# Patient Record
Sex: Male | Born: 1940 | Race: White | Hispanic: No | Marital: Married | Smoking: Former smoker
Health system: Southern US, Community
[De-identification: ages and names within clinical notes are randomized; demographics above are authoritative.]

## PROBLEM LIST (undated history)

## (undated) DIAGNOSIS — E119 Type 2 diabetes mellitus without complications: Secondary | ICD-10-CM

## (undated) DIAGNOSIS — I10 Essential (primary) hypertension: Secondary | ICD-10-CM

## (undated) DIAGNOSIS — K7460 Unspecified cirrhosis of liver: Secondary | ICD-10-CM

## (undated) DIAGNOSIS — Z515 Encounter for palliative care: Secondary | ICD-10-CM

## (undated) HISTORY — PX: BACK SURGERY: SHX140

## (undated) HISTORY — PX: HERNIA REPAIR: SHX51

---

## 1998-09-20 ENCOUNTER — Ambulatory Visit (HOSPITAL_COMMUNITY): Admission: RE | Admit: 1998-09-20 | Discharge: 1998-09-20 | Payer: Self-pay | Admitting: Gastroenterology

## 2000-02-29 ENCOUNTER — Encounter (INDEPENDENT_AMBULATORY_CARE_PROVIDER_SITE_OTHER): Payer: Self-pay | Admitting: Specialist

## 2000-02-29 ENCOUNTER — Other Ambulatory Visit: Admission: RE | Admit: 2000-02-29 | Discharge: 2000-02-29 | Payer: Self-pay | Admitting: Urology

## 2001-05-20 ENCOUNTER — Encounter: Admission: RE | Admit: 2001-05-20 | Discharge: 2001-08-18 | Payer: Self-pay | Admitting: Family Medicine

## 2001-07-20 ENCOUNTER — Emergency Department (HOSPITAL_COMMUNITY): Admission: EM | Admit: 2001-07-20 | Discharge: 2001-07-20 | Payer: Self-pay

## 2001-08-19 ENCOUNTER — Ambulatory Visit (HOSPITAL_COMMUNITY): Admission: RE | Admit: 2001-08-19 | Discharge: 2001-08-19 | Payer: Self-pay | Admitting: Gastroenterology

## 2001-08-19 ENCOUNTER — Encounter (INDEPENDENT_AMBULATORY_CARE_PROVIDER_SITE_OTHER): Payer: Self-pay | Admitting: Specialist

## 2001-09-30 ENCOUNTER — Encounter: Admission: RE | Admit: 2001-09-30 | Discharge: 2001-09-30 | Payer: Self-pay | Admitting: General Surgery

## 2001-09-30 ENCOUNTER — Encounter: Payer: Self-pay | Admitting: General Surgery

## 2001-10-02 ENCOUNTER — Ambulatory Visit (HOSPITAL_BASED_OUTPATIENT_CLINIC_OR_DEPARTMENT_OTHER): Admission: RE | Admit: 2001-10-02 | Discharge: 2001-10-02 | Payer: Self-pay | Admitting: General Surgery

## 2001-10-02 ENCOUNTER — Encounter (INDEPENDENT_AMBULATORY_CARE_PROVIDER_SITE_OTHER): Payer: Self-pay | Admitting: Specialist

## 2002-04-29 ENCOUNTER — Encounter: Admission: RE | Admit: 2002-04-29 | Discharge: 2002-04-29 | Payer: Self-pay | Admitting: Family Medicine

## 2002-04-29 ENCOUNTER — Encounter: Payer: Self-pay | Admitting: Family Medicine

## 2005-03-10 ENCOUNTER — Encounter: Admission: RE | Admit: 2005-03-10 | Discharge: 2005-03-10 | Payer: Self-pay | Admitting: Allergy and Immunology

## 2005-03-20 ENCOUNTER — Encounter: Admission: RE | Admit: 2005-03-20 | Discharge: 2005-03-20 | Payer: Self-pay | Admitting: Family Medicine

## 2007-05-07 ENCOUNTER — Ambulatory Visit (HOSPITAL_COMMUNITY): Admission: RE | Admit: 2007-05-07 | Discharge: 2007-05-07 | Payer: Self-pay | Admitting: Gastroenterology

## 2007-05-07 ENCOUNTER — Encounter (INDEPENDENT_AMBULATORY_CARE_PROVIDER_SITE_OTHER): Payer: Self-pay | Admitting: Gastroenterology

## 2007-10-22 ENCOUNTER — Encounter (INDEPENDENT_AMBULATORY_CARE_PROVIDER_SITE_OTHER): Payer: Self-pay | Admitting: Gastroenterology

## 2007-10-22 ENCOUNTER — Ambulatory Visit (HOSPITAL_COMMUNITY): Admission: RE | Admit: 2007-10-22 | Discharge: 2007-10-22 | Payer: Self-pay | Admitting: Gastroenterology

## 2010-01-30 ENCOUNTER — Emergency Department (HOSPITAL_COMMUNITY)
Admission: EM | Admit: 2010-01-30 | Discharge: 2010-01-30 | Payer: Self-pay | Source: Home / Self Care | Admitting: Emergency Medicine

## 2010-09-02 LAB — CBC
HCT: 40.7 % (ref 39.0–52.0)
RDW: 13.4 % (ref 11.5–15.5)
WBC: 7 10*3/uL (ref 4.0–10.5)

## 2010-09-02 LAB — URINALYSIS, ROUTINE W REFLEX MICROSCOPIC
Glucose, UA: NEGATIVE mg/dL
Protein, ur: NEGATIVE mg/dL
pH: 7 (ref 5.0–8.0)

## 2010-09-02 LAB — POCT I-STAT, CHEM 8
BUN: 14 mg/dL (ref 6–23)
Chloride: 103 mEq/L (ref 96–112)
Potassium: 3.6 mEq/L (ref 3.5–5.1)
Sodium: 138 mEq/L (ref 135–145)

## 2010-09-02 LAB — DIFFERENTIAL
Basophils Absolute: 0 10*3/uL (ref 0.0–0.1)
Lymphocytes Relative: 17 % (ref 12–46)
Monocytes Absolute: 0.8 10*3/uL (ref 0.1–1.0)
Neutro Abs: 4.8 10*3/uL (ref 1.7–7.7)

## 2010-09-02 LAB — ROCKY MTN SPOTTED FVR AB, IGG-BLOOD: RMSF IgG: 0.64 IV

## 2010-09-02 LAB — ROCKY MTN SPOTTED FVR AB, IGM-BLOOD: RMSF IgM: 0.09 IV (ref 0.00–0.89)

## 2010-09-02 LAB — B. BURGDORFI ANTIBODIES: B burgdorferi Ab IgG+IgM: 0.3 {ISR}

## 2010-10-02 ENCOUNTER — Emergency Department (HOSPITAL_COMMUNITY)
Admission: EM | Admit: 2010-10-02 | Discharge: 2010-10-02 | Disposition: A | Discharge status: Home / Self Care | Payer: Medicare Other | Attending: Emergency Medicine | Admitting: Emergency Medicine

## 2010-10-02 ENCOUNTER — Emergency Department (HOSPITAL_COMMUNITY): Payer: Medicare Other

## 2010-10-02 DIAGNOSIS — Y92009 Unspecified place in unspecified non-institutional (private) residence as the place of occurrence of the external cause: Secondary | ICD-10-CM | POA: Insufficient documentation

## 2010-10-02 DIAGNOSIS — I10 Essential (primary) hypertension: Secondary | ICD-10-CM | POA: Insufficient documentation

## 2010-10-02 DIAGNOSIS — E7800 Pure hypercholesterolemia, unspecified: Secondary | ICD-10-CM | POA: Insufficient documentation

## 2010-10-02 DIAGNOSIS — Z79899 Other long term (current) drug therapy: Secondary | ICD-10-CM | POA: Insufficient documentation

## 2010-10-02 DIAGNOSIS — S0990XA Unspecified injury of head, initial encounter: Secondary | ICD-10-CM | POA: Insufficient documentation

## 2010-10-02 DIAGNOSIS — K219 Gastro-esophageal reflux disease without esophagitis: Secondary | ICD-10-CM | POA: Insufficient documentation

## 2010-10-02 DIAGNOSIS — S0100XA Unspecified open wound of scalp, initial encounter: Secondary | ICD-10-CM | POA: Insufficient documentation

## 2010-10-02 DIAGNOSIS — W010XXA Fall on same level from slipping, tripping and stumbling without subsequent striking against object, initial encounter: Secondary | ICD-10-CM | POA: Insufficient documentation

## 2010-10-02 DIAGNOSIS — E119 Type 2 diabetes mellitus without complications: Secondary | ICD-10-CM | POA: Insufficient documentation

## 2010-11-01 NOTE — Op Note (Signed)
NAME:  Jerry [name], DUPUY NO.:  [id number]   MEDICAL RECORD NO.:  [id number]          PATIENT TYPE:  AMB   LOCATION:  ENDO                         FACILITY:  [facility]   PHYSICIAN:  Bernette Redbird, M.D.   DATE OF BIRTH:  29-Aug-1940   DATE OF PROCEDURE:  10/22/2007  DATE OF DISCHARGE:                               OPERATIVE REPORT   PROCEDURE:  Colonoscopy with biopsy.   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  This is being done to confirm  the presence or absence of complete ablation of that lesion.   FINDINGS:  Successful ablation of the large flat polyp noted previously.  Several diminutive polyps removed.  Sigmoid diverticulosis.   PROCEDURE:  The nature, purpose, and risks of the procedure were  familiar to the patient, who provided written consent.  Sedation was  fentanyl 100 mcg and Versed 10 mg IV without clinical instability.  Digital exam of the prostate showed it to be enlarged and somewhat firm  but without discrete nodules.   The Pentax adult video colonoscope was advanced to the cecum as  identified by visualization of the appendiceal orifice and ileocecal  valve.  There was mild to moderate looping but overall advancement was  not that difficult.   I encountered four diminutive sessile polyps on the way in.  One was in  the mid transverse colon, two were near the splenic flexure and one was  in the ascending colon near the previous tattoo.  Each of these was  removed by several cold biopsies.  It appeared excision was probably  complete in all cases.   The tattoo corresponding to the previous polypectomy site was identified  in the mid to proximal ascending colon area and nearby was a white  eschar or scar almost certainly corresponding to the mid previous polyp  ablation site.  I could not see any evidence  of residual polyp tissue in  this area despite careful inspection.  Nearby, but not really part of  the scar area, was a diminutive sessile polyp biopsied as described  above.   There was moderately severe sigmoid diverticulosis.   No large polyps, cancer or colitis were observed.  There was a roughly 1-  cm erythematous blotchy area in the ascending colon but it did not look  like a classic AVM.   Retroflexion in the rectum was unremarkable.  A small bump that  essentially flattened out on antegrade viewing was not biopsied.   The patient tolerated the procedure well and there no apparent  complications.   IMPRESSION:  1. Several small polyps removed by cold biopsy as described above.  2. No evidence of recurrent or residual polyp in the ascending colon      at the site of the previous argon plasma coagulation therapy of the      flat adenoma from 6 months ago.  3. Severe sigmoid diverticulosis.   PLAN:  Await pathology results.  Probable colonoscopic  follow-up in 2  years to carefully monitor the site of the previous flat adenoma.           ______________________________  Bernette Redbird, M.D.     RB/MEDQ  D:  10/22/2007  T:  10/22/2007  Job:  [id number]   cc:   Bryan Lemma. Manus Gunning, M.D.  Fax: [phone]

## 2010-11-01 NOTE — Op Note (Signed)
NAME:  Jerry [name], Jerry [name] NO.:  [id number]   MEDICAL RECORD NO.:  [id number]          PATIENT TYPE:  AMB   LOCATION:  ENDO                         FACILITY:  [facility]   PHYSICIAN:  Bernette Redbird, M.D.   DATE OF BIRTH:  15-Nov-1940   DATE OF PROCEDURE:  05/07/2007  DATE OF DISCHARGE:                               OPERATIVE REPORT   PROCEDURE:  Colonoscopy with ablation of mucosal lesion and biopsy.   INDICATIONS:  70 year old gentleman six months status post a colonoscopy  which showed a flat adenoma in the region of the proximal colon, unable  to be resected due to its flat character, with biopsies showing  adenomatous change without high grade dysplasia.   FINDINGS:  3 cm x 1 cm flat adenoma in the proximal ascending colon  treated with Argon plasma coagulation therapy.  Two diminutive polyps  cold biopsied.  Severe sigmoid diverticulosis.   DESCRIPTION OF PROCEDURE:  The nature, purpose, and risks of the  procedure were familiar to the patient from prior examination in the  provided written consent.  Sedation was Phenergan 12.5 mg, fentanyl 87.5  mcg, and Versed 7.5 mg IV, without arrhythmias or desaturation.  Digital  exam of the prostate was unremarkable.  The Pentax adult video  colonoscope was advanced quite easily around the colon to the cecum as  identified by visualization of the appendiceal orifice and pullback was  then performed.  The quality of the prep was fair, adequate to exclude  significant or large polyps, but possibly obscuring smaller polyps.  Two  diminutive sessile polyps were identified and removed by cold biopsy  technique, one from the transverse colon, the other from the proximal  ascending colon.  The area of the previously noted high grade that flat  adenoma was identified by the adjacent tattoo.  The Argon plasma  coagulator was used to cauterize virtually the entire surface of that  polyp, without evidence of bleeding or excessive tissue  penetration,  evident to endoscopic exam.  Pullback was then performed the remainder  of the way around the colon.  There was severe sigmoid diverticular  disease with both large and small mouth diverticula.  No large polyps,  cancer, colitis or evidence of infection was noted.  There were a few  spots where there appeared to be telangiectatic changes in the colon,  almost like spider veins about 1 cm in diameter.  The patient tolerated  the procedure well and there were no apparent complications.   IMPRESSION:  1. Severe sigmoid diverticulosis.  2. Two diminutive sessile polyps removed by cold biopsy technique.  3. Flat adenoma as previously identified, treated with Argon plasma      coagulator as described above.   PLAN:  1. Await pathology results on the small polyps.  2. Follow up colonoscopy in about six months to recheck the area of      the flat adenoma and to render further Argon plasma coagulation      therapy, if needed.          ______________________________  Bernette Redbird, M.D.    RB/MEDQ  D:  05/07/2007  T:  05/07/2007  Job:  [id number]

## 2010-11-04 NOTE — Op Note (Signed)
[place]  Patient:    Jerry [name], Jerry [name] Visit Number: [id number] MRN: [id number]          Service Type: DSU Location: [facility] Attending Physician:  Caleen Essex Dictated by:   Ollen Gross. Vernell Morgans, M.D. Proc. Date: 10/02/01 Admit Date:  10/02/2001 Discharge Date: 10/02/2001                             Operative Report  PREOPERATIVE DIAGNOSIS:  Left inguinal hernia.  POSTOPERATIVE DIAGNOSIS:  Left direct and indirect inguinal hernia with large lipoma of the cord.  PROCEDURE:  Repair of left inguinal hernia with mesh and excision of the lipoma of the cord.  SURGEON:  Ollen Gross. Vernell Morgans, M.D.  ANESTHESIA:  General via LMA.  DESCRIPTION OF PROCEDURE:  After informed consent was obtained, the patient was brought to the operating room and placed in the supine position on the operating room table.  After adequate induction of general anesthesia, the patients left groin was prepped with Betadine and draped in the usual sterile manner.  An incision was made from near the edge of the pubic tubercle toward the anterior superior iliac spine with a 15 blade knife.  This incision was carried down through the skin and subcutaneous tissues using the Bovie electrocautery until the external oblique fascia was encountered.  The fascia was opened along its fibers with a 15 blade knife and Metzenbaum scissors were placed underneath the fascial layer to separate any underlying adhesions.  The fascial layer was then opened toward the apex of the external ring.  Blunt dissection was then carried out around the cord and at the edge of the pubic tubercle.  The cord was able to be surrounded between two fingers with blunt dissection.  A quarter-inch Penrose drain was then placed around these structures for retraction purposes.  There was an obvious large lipoma of the cord, which was dissected sharply with the Bovie electrocautery and bluntly, and the base of it was  clamped with a hemostat and tied with a 3-0 silk tie after being divided.  The lipoma was sent to pathology for evaluation. Attention was then turned to the cord structures themselves.  Blunt dissection was carried out to separate the cord structures, and a hernia sac was identified.  This was separated from the rest of the cord structures carefully so as to not damage the vas deferens, and the hernia sac was then opened. There were no viscera within the sac and the sac was ligated near its base with a 2-0 silk suture ligature, and the distal sac was sent to pathology for evaluation.  There was also an obvious weakness of the floor of the inguinal canal with significant bulging, and this was temporarily repaired with a running 3-0 Vicryl stitch.  At this point a piece of Atrium mesh was fashioned to fit the area and was sewn in a running manner with a 2-0 Prolene stitch to the shelving edge of the inguinal ligament inferiorly.  The mesh was then tacked to the aponeurotic muscular layer, strength layer of the transversalis superiorly in multiple spots with interrupted 2-0 Prolene vertical mattress stitches.  The tails of the mesh were wrapped around the cord and placed out laterally toward the anterior superior iliac spine.  The tails of the mesh were anchored laterally to the shelving edge of the inguinal ligament with an interrupted Prolene stitch.  The external oblique fascia  was then closed with a running 2-0 Prolene stitch.  The ilioinguinal nerve was left intact with the cord.  The subcutaneous fascial layer was closed with interrupted 3-0 Vicryl stitches.  The wound was infiltrated with 0.25% Marcaine and the skin was closed with a running 4-0 Monocryl subcuticular stitch.  Benzoin and Steri-Strips with sterile dressings were applied.  The patient tolerated the procedure well.  At the end of the case, all needle, sponge, and instrument counts were correct.  The patient was awakened and  taken to the recovery room in stable condition. Dictated by:   Ollen Gross. Vernell Morgans, M.D. Attending Physician:  Caleen Essex DD:  10/03/01 TD:  10/04/01 Job: [id number]

## 2010-11-04 NOTE — Procedures (Signed)
[place]  Patient:    Jerry [name], Jerry [name] Visit Number: [id number] MRN: [id number]          Service Type: END Location: ENDO Attending Physician:  Rich Brave Dictated by:   Florencia Reasons, M.D. Proc. Date: 08/19/01 Admit Date:  08/19/2001   CC:         Dyanne Carrel, M.D.   Procedure Report  PROCEDURE PERFORMED:  Colonoscopy with polypectomy.  ENDOSCOPIST:  Florencia Reasons, M.D.  INDICATIONS FOR PROCEDURE:  The patient is a 70 year old gentleman with recent rectal bleeding.  He has prior history of colonic adenomas having been removed, most recently two small adenomas removed about three years ago.  He also is known to have significant diverticulosis.  FINDINGS:  A 4 mm polyp removed from the proximal rectum.  Extensive diverticulosis.  DESCRIPTION OF PROCEDURE:  The nature, purpose and risks of the procedure were familiar to the patient from prior examination and he provided written consent.  The Olympus adult video colonoscope was advanced quite easily to the cecum as identified by the visualization of the ileocecal valve and the absence of further lumen.  Pull-back was then performed.  The appendiceal orifice was also identified.  The quality of the prep was very good and it is felt that all areas were adequately seen.  The patient had a 3 to 4 mm semipedunculated polyp snared at 16 cm from the external anal opening in the proximal rectum.  The polyp was retrieved by suctioning through the scope.  No other polyps were seen and there was no evidence of cancer, colitis or vascular malformations.  There was extensive diverticulosis, at least on the left side.  The patient tolerated the procedure well and there were no apparent complications.  IMPRESSION:  Small rectal polyp removed.  Extensive diverticulosis.  PLAN:  Await pathology on the polyp.  Probable  can wait five years for follow-up colonoscopy,  keeping in mind the patients prior history of colonic adenomas. Dictated by:   Florencia Reasons, M.D. Attending Physician:  Rich Brave DD:  08/19/01 TD:  08/19/01 Job: [id number]

## 2013-03-31 ENCOUNTER — Ambulatory Visit
Admission: RE | Admit: 2013-03-31 | Discharge: 2013-03-31 | Disposition: A | Discharge status: Home / Self Care | Payer: Medicare Other | Source: Ambulatory Visit | Attending: Family Medicine | Admitting: Family Medicine

## 2013-03-31 ENCOUNTER — Other Ambulatory Visit: Payer: Self-pay | Admitting: Family Medicine

## 2013-03-31 DIAGNOSIS — R079 Chest pain, unspecified: Secondary | ICD-10-CM

## 2013-04-04 ENCOUNTER — Other Ambulatory Visit: Payer: Self-pay | Admitting: Physician Assistant

## 2013-04-04 DIAGNOSIS — R109 Unspecified abdominal pain: Secondary | ICD-10-CM

## 2013-04-06 ENCOUNTER — Emergency Department (HOSPITAL_COMMUNITY): Payer: Medicare Other

## 2013-04-06 ENCOUNTER — Encounter (HOSPITAL_COMMUNITY): Payer: Self-pay | Admitting: Emergency Medicine

## 2013-04-06 ENCOUNTER — Inpatient Hospital Stay (HOSPITAL_COMMUNITY)
Admission: EM | Admit: 2013-04-06 | Discharge: 2013-04-09 | DRG: 441 | Disposition: A | Discharge status: Home / Self Care | Payer: Medicare Other | Attending: Internal Medicine | Admitting: Internal Medicine

## 2013-04-06 DIAGNOSIS — Z87891 Personal history of nicotine dependence: Secondary | ICD-10-CM

## 2013-04-06 DIAGNOSIS — R197 Diarrhea, unspecified: Secondary | ICD-10-CM | POA: Diagnosis present

## 2013-04-06 DIAGNOSIS — E871 Hypo-osmolality and hyponatremia: Secondary | ICD-10-CM | POA: Diagnosis present

## 2013-04-06 DIAGNOSIS — D6959 Other secondary thrombocytopenia: Secondary | ICD-10-CM | POA: Diagnosis present

## 2013-04-06 DIAGNOSIS — M7989 Other specified soft tissue disorders: Secondary | ICD-10-CM | POA: Diagnosis present

## 2013-04-06 DIAGNOSIS — N189 Chronic kidney disease, unspecified: Secondary | ICD-10-CM | POA: Diagnosis present

## 2013-04-06 DIAGNOSIS — R7402 Elevation of levels of lactic acid dehydrogenase (LDH): Secondary | ICD-10-CM | POA: Diagnosis present

## 2013-04-06 DIAGNOSIS — I129 Hypertensive chronic kidney disease with stage 1 through stage 4 chronic kidney disease, or unspecified chronic kidney disease: Secondary | ICD-10-CM | POA: Diagnosis present

## 2013-04-06 DIAGNOSIS — R748 Abnormal levels of other serum enzymes: Secondary | ICD-10-CM | POA: Diagnosis present

## 2013-04-06 DIAGNOSIS — E119 Type 2 diabetes mellitus without complications: Secondary | ICD-10-CM | POA: Diagnosis present

## 2013-04-06 DIAGNOSIS — K7689 Other specified diseases of liver: Principal | ICD-10-CM | POA: Diagnosis present

## 2013-04-06 DIAGNOSIS — R7401 Elevation of levels of liver transaminase levels: Secondary | ICD-10-CM | POA: Diagnosis present

## 2013-04-06 DIAGNOSIS — J9600 Acute respiratory failure, unspecified whether with hypoxia or hypercapnia: Secondary | ICD-10-CM | POA: Diagnosis present

## 2013-04-06 DIAGNOSIS — R188 Other ascites: Secondary | ICD-10-CM | POA: Diagnosis present

## 2013-04-06 DIAGNOSIS — N179 Acute kidney failure, unspecified: Secondary | ICD-10-CM | POA: Diagnosis present

## 2013-04-06 HISTORY — DX: Type 2 diabetes mellitus without complications: E11.9

## 2013-04-06 HISTORY — DX: Essential (primary) hypertension: I10

## 2013-04-06 LAB — COMPREHENSIVE METABOLIC PANEL
ALT: 21 U/L (ref 0–53)
AST: 45 U/L — ABNORMAL HIGH (ref 0–37)
Alkaline Phosphatase: 50 U/L (ref 39–117)
CO2: 21 mEq/L (ref 19–32)
GFR calc Af Amer: 42 mL/min — ABNORMAL LOW (ref >90)
GFR calc non Af Amer: 37 mL/min — ABNORMAL LOW (ref >90)
Glucose, Bld: 71 mg/dL (ref 70–99)
Potassium: 3.9 mEq/L (ref 3.5–5.1)
Sodium: 127 mEq/L — ABNORMAL LOW (ref 135–145)
Total Protein: 7 g/dL (ref 6.0–8.3)

## 2013-04-06 LAB — CBC WITH DIFFERENTIAL/PLATELET
Basophils Absolute: 0 K/uL (ref 0.0–0.1)
Basophils Relative: 0 % (ref 0–1)
Eosinophils Absolute: 0.1 K/uL (ref 0.0–0.7)
Eosinophils Relative: 1 % (ref 0–5)
HCT: 36.4 % — ABNORMAL LOW (ref 39.0–52.0)
Hemoglobin: 12.6 g/dL — ABNORMAL LOW (ref 13.0–17.0)
Lymphocytes Relative: 18 % (ref 12–46)
Lymphs Abs: 1.9 K/uL (ref 0.7–4.0)
MCH: 31.1 pg (ref 26.0–34.0)
MCHC: 34.6 g/dL (ref 30.0–36.0)
MCV: 89.9 fL (ref 78.0–100.0)
Monocytes Absolute: 1.3 K/uL — ABNORMAL HIGH (ref 0.1–1.0)
Monocytes Relative: 13 % — ABNORMAL HIGH (ref 3–12)
Neutro Abs: 7.1 K/uL (ref 1.7–7.7)
Neutrophils Relative %: 68 % (ref 43–77)
Platelets: 158 K/uL (ref 150–400)
RBC: 4.05 MIL/uL — ABNORMAL LOW (ref 4.22–5.81)
RDW: 15.3 % (ref 11.5–15.5)
WBC: 10.4 K/uL (ref 4.0–10.5)

## 2013-04-06 LAB — URINALYSIS, ROUTINE W REFLEX MICROSCOPIC
Bilirubin Urine: NEGATIVE
Glucose, UA: NEGATIVE mg/dL
Hgb urine dipstick: NEGATIVE
Ketones, ur: NEGATIVE mg/dL
Nitrite: NEGATIVE
Protein, ur: NEGATIVE mg/dL
Specific Gravity, Urine: 1.022 (ref 1.005–1.030)
Urobilinogen, UA: 0.2 mg/dL (ref 0.0–1.0)
pH: 5.5 (ref 5.0–8.0)

## 2013-04-06 LAB — PROTIME-INR
INR: 1.36 (ref 0.00–1.49)
Prothrombin Time: 16.4 seconds — ABNORMAL HIGH (ref 11.6–15.2)

## 2013-04-06 LAB — PHOSPHORUS: Phosphorus: 3.4 mg/dL (ref 2.3–4.6)

## 2013-04-06 LAB — MAGNESIUM: Magnesium: 2.3 mg/dL (ref 1.5–2.5)

## 2013-04-06 LAB — GLUCOSE, CAPILLARY
Glucose-Capillary: 72 mg/dL (ref 70–99)
Glucose-Capillary: 150 mg/dL — ABNORMAL HIGH (ref 70–99)

## 2013-04-06 LAB — TSH: TSH: 2.001 u[IU]/mL (ref 0.350–4.500)

## 2013-04-06 LAB — URINE MICROSCOPIC-ADD ON

## 2013-04-06 LAB — LIPASE, BLOOD: Lipase: 70 U/L — ABNORMAL HIGH (ref 11–59)

## 2013-04-06 LAB — TROPONIN I: Troponin I: <0.3 ng/mL

## 2013-04-06 LAB — PRO B NATRIURETIC PEPTIDE: Pro B Natriuretic peptide (BNP): 73.5 pg/mL (ref 0–125)

## 2013-04-06 MED ORDER — ENOXAPARIN SODIUM 30 MG/0.3ML ~~LOC~~ SOLN: 30.0000 mg | SUBCUTANEOUS | Status: DC

## 2013-04-06 MED ORDER — HYDROCODONE-ACETAMINOPHEN 5-325 MG PO TABS: 1.0000 | ORAL_TABLET | ORAL | Status: DC | PRN

## 2013-04-06 MED ORDER — IOHEXOL 300 MG/ML  SOLN
50.0000 mL | Freq: Once | INTRAMUSCULAR | Status: AC | PRN
  Administered 2013-04-06: 50 mL via ORAL

## 2013-04-06 MED ORDER — SODIUM CHLORIDE 0.9 % IJ SOLN: 3.0000 mL | INTRAMUSCULAR | Status: DC | PRN

## 2013-04-06 MED ORDER — ENOXAPARIN SODIUM 30 MG/0.3ML ~~LOC~~ SOLN
30.0000 mg | SUBCUTANEOUS | Status: DC
  Administered 2013-04-07 – 2013-04-08 (×2): 30 mg via SUBCUTANEOUS
  Filled 2013-04-06 (×3): qty 0.3

## 2013-04-06 MED ORDER — FUROSEMIDE 10 MG/ML IJ SOLN
40.0000 mg | Freq: Once | INTRAMUSCULAR | Status: AC
  Administered 2013-04-06: 40 mg via INTRAVENOUS
  Filled 2013-04-06: qty 4

## 2013-04-06 MED ORDER — PANTOPRAZOLE SODIUM 40 MG PO TBEC
40.0000 mg | DELAYED_RELEASE_TABLET | Freq: Every day | ORAL | Status: DC
  Administered 2013-04-06 – 2013-04-09 (×3): 40 mg via ORAL
  Filled 2013-04-06 (×4): qty 1

## 2013-04-06 MED ORDER — ENOXAPARIN SODIUM 40 MG/0.4ML ~~LOC~~ SOLN
40.0000 mg | SUBCUTANEOUS | Status: DC
  Administered 2013-04-06: 40 mg via SUBCUTANEOUS
  Filled 2013-04-06: qty 0.4

## 2013-04-06 MED ORDER — ONDANSETRON HCL 4 MG PO TABS: 4.0000 mg | ORAL_TABLET | Freq: Four times a day (QID) | ORAL | Status: DC | PRN

## 2013-04-06 MED ORDER — HYDROMORPHONE HCL PF 1 MG/ML IJ SOLN: 1.0000 mg | INTRAMUSCULAR | Status: DC | PRN

## 2013-04-06 MED ORDER — SODIUM CHLORIDE 0.9 % IV SOLN: 250.0000 mL | INTRAVENOUS | Status: DC | PRN

## 2013-04-06 MED ORDER — SODIUM CHLORIDE 0.9 % IJ SOLN
3.0000 mL | Freq: Two times a day (BID) | INTRAMUSCULAR | Status: DC
  Administered 2013-04-08 – 2013-04-09 (×3): 3 mL via INTRAVENOUS

## 2013-04-06 MED ORDER — ONDANSETRON HCL 4 MG/2ML IJ SOLN: 4.0000 mg | Freq: Four times a day (QID) | INTRAMUSCULAR | Status: DC | PRN

## 2013-04-06 MED ORDER — SODIUM CHLORIDE 0.9 % IV BOLUS (SEPSIS)
500.0000 mL | Freq: Once | INTRAVENOUS | Status: AC
  Administered 2013-04-06: 500 mL via INTRAVENOUS

## 2013-04-06 NOTE — H&P (Signed)
[facility] History and Physical  Jerry [name] ZOX:[id number] DOB: 10/16/1940 DOA: 04/06/2013  Referring physician: ED physician PCP: Provider Not In System   Chief Complaint: shortness of breath   HPI:  Pt is 72 yo male who presented to [facility] ED with main concern of progressively worsening abdominal distension that he initially noted several weeks prior to this admission, now much worse and making it difficult to take deep breath. Pt is unsure of the weight gain but feels like he did gain several pounds. He denies similar events in the past, denies fevers, chills, chest pain, no specific abdominal concerns except distension and non bloody diarrhea that also started several days prior to this admission. Pt explains he has recently been treated with ABX but he can not recall the name of the ABX. He denies recent sicknesses or hospitalizations, no sick contacts or exposures, no alcohol use.   In ED, pt noted to have significant ascites on exam, confirmed with CT abd. [facility] asked to admit for further evaluation and management.   Assessment and Plan:  Principal Problem:   Acute respiratory failure - most likely secondary to abdominal ascites - once paracentesis done this will likely improve - monitor closely vitals per floor protocol  Active Problems:   Ascites - unclear etiology, cirrhosis evident on CT abd and pelvis and ? Alcohol induced liver damage - will ask IR for assistance with therapeutic and diagnostic paracentesis   Diarrhea - unclear etiology and given recent use of ABX will check C. Diff PCR - monitor electrolytes and supplement as indicated   Acute on chronic renal failure - likely pre renal component imposed on chronic renal failure - will hold ARB, Metformin, Dyazide  - repeat BMP in AM   Transaminitis - unclear etiology and appears to be in the pattern of alcohol use - will trend while inpatient    Elevated lipase - given transaminitis there is certainly  suspicion of alcohol use but pt denies - will repeat in AM - treat with analgesia and antiemetics as needed   Hyponatremia - most likely secondary to volume status   Diabetes mellitus - will check A1C - hold Metformin due to acute renal failure - place on SSI for now  - monitor closely, BMP in AM  Code Status: Full Family Communication: Pt at bedside Disposition Plan: Admit to medical floor   Review of Systems:  Constitutional: Negative for fever, chills and malaise/fatigue. Negative for diaphoresis.  HENT: Negative for hearing loss, ear pain, nosebleeds, congestion, sore throat, neck pain, tinnitus and ear discharge.   Eyes: Negative for blurred vision, double vision, photophobia, pain, discharge and redness.  Respiratory: Negative for cough, hemoptysis, heezing and stridor.   Cardiovascular: Negative for chest pain, palpitations, orthopnea, claudication.  Gastrointestinal: Negative for heartburn, constipation, blood in stool and melena.  Genitourinary: Negative for dysuria, urgency, frequency, hematuria and flank pain.  Musculoskeletal: Negative for myalgias, back pain, joint pain and falls.  Skin: Negative for itching and rash.  Neurological: Negative for tingling, tremors, sensory change, speech change, focal weakness, loss of consciousness and headaches.  Endo/Heme/Allergies: Negative for environmental allergies and polydipsia. Does not bruise/bleed easily.  Psychiatric/Behavioral: Negative for suicidal ideas. The patient is not nervous/anxious.      Past Medical History  Diagnosis Date  . Diabetes mellitus without complication   . Hypertension     Past Surgical History  Procedure Laterality Date  . Back surgery    . Hernia repair      Social  History:  reports that he has quit smoking. He does not have any smokeless tobacco history on file. He reports that he does not drink alcohol or use illicit drugs.  No Known Allergies  No family history of cancers   Prior to  Admission medications   Medication Sig Start Date End Date Taking? Authorizing Provider  glyBURIDE micronized (GLYNASE) 3 MG tablet Take 3 mg by mouth daily with breakfast.  03/26/13  Yes Historical Provider, MD  losartan (COZAAR) 100 MG tablet Take 100 mg by mouth daily.  03/25/13  Yes Historical Provider, MD  metFORMIN (GLUCOPHAGE-XR) 500 MG 24 hr tablet Take 500 mg by mouth daily with breakfast.  02/07/13  Yes Historical Provider, MD  omeprazole-sodium bicarbonate (ZEGERID) 40-1100 MG per capsule Take 1 capsule by mouth daily before breakfast.  04/01/13  Yes Historical Provider, MD  simvastatin (ZOCOR) 10 MG tablet Take 10 mg by mouth at bedtime.   Yes Historical Provider, MD  triamterene-hydrochlorothiazide (DYAZIDE) 37.5-25 MG per capsule Take 1 capsule by mouth daily.  03/26/13  Yes Historical Provider, MD    Physical Exam: Filed Vitals:   04/06/13 0332 04/06/13 0444 04/06/13 0448 04/06/13 0756  BP:    114/71  Pulse: 103  96 95  Temp: 98.4 F (36.9 C)     TempSrc: Oral     Resp: 20   20  SpO2: 97% 96% 97% 97%    Physical Exam  Constitutional: Appears well-developed and well-nourished. No distress.  HENT: Normocephalic. External right and left ear normal. Oropharynx is clear and moist.  Eyes: Conjunctivae and EOM are normal. PERRLA, no scleral icterus.  Neck: Normal ROM. Neck supple. No JVD. No tracheal deviation. No thyromegaly.  CVS: RRR, S1/S2 +, no murmurs, no gallops, no carotid bruit.  Pulmonary: Effort and breath sounds normal, no stridor, rhonchi, wheezes, rales.  Abdominal: Hard and distended with tenderness in epigastric area, no rebound or guarding.  Musculoskeletal: Normal range of motion. +2 bilateral LE pitting edema  Lymphadenopathy: No lymphadenopathy noted, cervical, inguinal. Neuro: Alert. Normal reflexes, muscle tone coordination. No cranial nerve deficit. Skin: Skin is warm and dry. No rash noted. Not diaphoretic. No erythema. No pallor.  Psychiatric: Normal  mood and affect. Behavior, judgment, thought content normal.   Labs on Admission:  Basic Metabolic Panel:  Recent Labs Lab 04/06/13 0518  NA 127*  K 3.9  CL 92*  CO2 21  GLUCOSE 71  BUN 38*  CREATININE 1.75*  CALCIUM 9.0   Liver Function Tests:  Recent Labs Lab 04/06/13 0518  AST 45*  ALT 21  ALKPHOS 50  BILITOT 0.8  PROT 7.0  ALBUMIN 3.5    Recent Labs Lab 04/06/13 0518  LIPASE 70*   CBC:  Recent Labs Lab 04/06/13 0518  WBC 10.4  NEUTROABS 7.1  HGB 12.6*  HCT 36.4*  MCV 89.9  PLT 158   Cardiac Enzymes:  Recent Labs Lab 04/06/13 0518  TROPONINI <0.30   Radiological Exams on Admission: Ct Abdomen Pelvis Wo Contrast  04/06/2013   Cirrhosis with evidence of portal hypertension. No splenomegaly. Moderate volume ascites with diffuse anasarca. Enlarged 1.6 cm gastrohepatic lymph node. Additional shotty adenopathy within the porta hepatis.  Colonic diverticulosis without evidence of acute diverticulitis. Enlarged prostate.    Dg Chest 2 View  04/06/2013    Hypoinflation with mild bibasilar subsegmental atelectasis. No active cardiopulmonary process identified.   EKG: Normal sinus rhythm, no ST/T wave changes  Debbora Presto, MD  [facility] Pager [phone]  If  7PM-7AM, please contact night-coverage [email or web address] Password [email or web address] 04/06/2013, 8:19 AM

## 2013-04-06 NOTE — ED Provider Notes (Signed)
CSN: [id number]     Arrival date & time 04/06/13  0327 History   First MD Initiated Contact with Patient 04/06/13 0405     Chief Complaint  Patient presents with  . Shortness of Breath   (Consider location/radiation/quality/duration/timing/severity/associated sxs/prior Treatment) Patient is a 72 y.o. male presenting with shortness of breath.  Shortness of Breath Associated symptoms: abdominal pain and cough   Associated symptoms: no chest pain, no fever, no headaches, no rash and no vomiting    Patient presents with increasing abdominal distention making it difficult to take deep breaths. He's had a mild cough without production. He denies any chest pain. He's had no fevers or chills. She admits to episodic abdominal cramping but is having no pain currently. He states he is passing blood gas and having multiple episodes diarrhea for the past 3 weeks prior that he was treated for diverticulitis. She denies nausea or vomiting. He has a CT scan showed as an outpatient next week. Past Medical History  Diagnosis Date  . Diabetes mellitus without complication   . Hypertension    Past Surgical History  Procedure Laterality Date  . Back surgery    . Hernia repair     History reviewed. No pertinent family history. History  Substance Use Topics  . Smoking status: Former Games developer  . Smokeless tobacco: Not on file  . Alcohol Use: No    Review of Systems  Constitutional: Negative for fever and chills.  Respiratory: Positive for cough and shortness of breath.   Cardiovascular: Negative for chest pain, palpitations and leg swelling.  Gastrointestinal: Positive for abdominal pain, diarrhea and abdominal distention. Negative for nausea, vomiting, constipation and blood in stool.  Musculoskeletal: Negative for back pain and neck stiffness.  Skin: Negative for rash and wound.  Neurological: Negative for dizziness, weakness, light-headedness, numbness and headaches.  All other systems reviewed and  are negative.    Allergies  Review of patient's allergies indicates no known allergies.  Home Medications   Current Outpatient Rx  Name  Route  Sig  Dispense  Refill  . glyBURIDE micronized (GLYNASE) 3 MG tablet   Oral   Take 3 mg by mouth daily with breakfast.          . losartan (COZAAR) 100 MG tablet   Oral   Take 100 mg by mouth daily.          . metFORMIN (GLUCOPHAGE-XR) 500 MG 24 hr tablet   Oral   Take 500 mg by mouth daily with breakfast.          . omeprazole-sodium bicarbonate (ZEGERID) 40-1100 MG per capsule   Oral   Take 1 capsule by mouth daily before breakfast.          . simvastatin (ZOCOR) 10 MG tablet   Oral   Take 10 mg by mouth at bedtime.         . triamterene-hydrochlorothiazide (DYAZIDE) 37.5-25 MG per capsule   Oral   Take 1 capsule by mouth daily.           Pulse 103  Temp(Src) 98.4 F (36.9 C) (Oral)  Resp 20  SpO2 97% Physical Exam  Nursing note and vitals reviewed. Constitutional: He is oriented to person, place, and time. He appears well-developed and well-nourished. No distress.  HENT:  Head: Normocephalic and atraumatic.  Mouth/Throat: Oropharynx is clear and moist.  Eyes: EOM are normal. Pupils are equal, round, and reactive to light.  Neck: Normal range of motion. Neck supple.  Cardiovascular: Normal rate and regular rhythm.   Pulmonary/Chest: Effort normal and breath sounds normal. No respiratory distress. He has no wheezes. He has no rales. He exhibits no tenderness.  Abdominal: Soft. He exhibits distension. He exhibits no mass. There is no tenderness. There is no rebound and no guarding.  Patient had an is distended and tense. He has no tenderness to palpation throughout. He has high-pitched bowel sounds throughout.  Musculoskeletal: Normal range of motion. He exhibits no edema and no tenderness.  Neurological: He is alert and oriented to person, place, and time.  Patient is alert and oriented x3 with clear, goal  oriented speech. Patient has 5/5 motor in all extremities. Sensation is intact to light touch.   Skin: Skin is warm and dry. No rash noted. No erythema.  Psychiatric: He has a normal mood and affect. His behavior is normal.    ED Course  Procedures (including critical care time) Labs Review Labs Reviewed  CBC WITH DIFFERENTIAL  COMPREHENSIVE METABOLIC PANEL  LIPASE, BLOOD  TROPONIN I  PRO B NATRIURETIC PEPTIDE  URINALYSIS, ROUTINE W REFLEX MICROSCOPIC   Imaging Review No results found.  EKG Interpretation   None       MDM  I suspect the origin of the patient's shortness of breath is due to his abdominal distention making it difficult to take a deep breath. Will get CT abdomen to evaluate for episodic abdominal pain and distention.  Discussed with [facility] and will admit.  Loren Racer, MD 04/07/13 [phone]

## 2013-04-06 NOTE — ED Notes (Signed)
Patient transported to X-ray 

## 2013-04-06 NOTE — ED Notes (Signed)
Pt arrived to Ed with a complaint of shortness of breath.  Pt states he has been to the doctors and a course of action to determine renal insufficiency has been ongoing.  Pt has a distended stomach that is swollen. Pt has had progressively greater shortness of breath for a week.  Pt has had diarrhea for three weeks.

## 2013-04-07 ENCOUNTER — Inpatient Hospital Stay (HOSPITAL_COMMUNITY): Payer: Medicare Other

## 2013-04-07 LAB — CBC
HCT: 33.3 % — ABNORMAL LOW (ref 39.0–52.0)
Hemoglobin: 11.6 g/dL — ABNORMAL LOW (ref 13.0–17.0)
MCH: 31.2 pg (ref 26.0–34.0)
MCHC: 34.8 g/dL (ref 30.0–36.0)
MCV: 89.5 fL (ref 78.0–100.0)
Platelets: 120 10*3/uL — ABNORMAL LOW (ref 150–400)
WBC: 7.7 10*3/uL (ref 4.0–10.5)

## 2013-04-07 LAB — BODY FLUID CELL COUNT WITH DIFFERENTIAL
Lymphs, Fluid: 38 %
Monocyte-Macrophage-Serous Fluid: 42 % — ABNORMAL LOW (ref 50–90)
Neutrophil Count, Fluid: 20 % (ref 0–25)

## 2013-04-07 LAB — PROTEIN, BODY FLUID: Total protein, fluid: 1.2 g/dL

## 2013-04-07 LAB — LACTATE DEHYDROGENASE, PLEURAL OR PERITONEAL FLUID: LD, Fluid: 47 U/L — ABNORMAL HIGH (ref 3–23)

## 2013-04-07 LAB — COMPREHENSIVE METABOLIC PANEL
Albumin: 3 g/dL — ABNORMAL LOW (ref 3.5–5.2)
Alkaline Phosphatase: 47 U/L (ref 39–117)
BUN: 35 mg/dL — ABNORMAL HIGH (ref 6–23)
Calcium: 8.4 mg/dL (ref 8.4–10.5)
GFR calc Af Amer: 55 mL/min — ABNORMAL LOW (ref >90)
Glucose, Bld: 131 mg/dL — ABNORMAL HIGH (ref 70–99)
Potassium: 3.7 mEq/L (ref 3.5–5.1)
Sodium: 127 mEq/L — ABNORMAL LOW (ref 135–145)
Total Protein: 6.1 g/dL (ref 6.0–8.3)

## 2013-04-07 LAB — LIPASE, BLOOD: Lipase: 52 U/L (ref 11–59)

## 2013-04-07 LAB — URINALYSIS, ROUTINE W REFLEX MICROSCOPIC
Bilirubin Urine: NEGATIVE
Hgb urine dipstick: NEGATIVE
Nitrite: NEGATIVE
Specific Gravity, Urine: 1.018 (ref 1.005–1.030)
Urobilinogen, UA: 0.2 mg/dL (ref 0.0–1.0)
pH: 5 (ref 5.0–8.0)

## 2013-04-07 LAB — GLUCOSE, CAPILLARY
Glucose-Capillary: 134 mg/dL — ABNORMAL HIGH (ref 70–99)
Glucose-Capillary: 138 mg/dL — ABNORMAL HIGH (ref 70–99)
Glucose-Capillary: 129 mg/dL — ABNORMAL HIGH (ref 70–99)
Glucose-Capillary: 154 mg/dL — ABNORMAL HIGH (ref 70–99)

## 2013-04-07 LAB — HEMOGLOBIN A1C
Hgb A1c MFr Bld: 6.9 % — ABNORMAL HIGH (ref <5.7)
Mean Plasma Glucose: 151 mg/dL — ABNORMAL HIGH (ref <117)

## 2013-04-07 LAB — PRO B NATRIURETIC PEPTIDE: Pro B Natriuretic peptide (BNP): 66 pg/mL (ref 0–125)

## 2013-04-07 LAB — ALBUMIN, FLUID (OTHER)

## 2013-04-07 LAB — PH, BODY FLUID: pH, Fluid: 7.5

## 2013-04-07 MED ORDER — FUROSEMIDE 10 MG/ML IJ SOLN
20.0000 mg | Freq: Every day | INTRAMUSCULAR | Status: DC
  Administered 2013-04-07 – 2013-04-08 (×2): 20 mg via INTRAVENOUS
  Filled 2013-04-07 (×2): qty 2

## 2013-04-07 NOTE — Procedures (Signed)
Successful US guided paracentesis from RLQ.  Yielded 6L of cloudy cream colored fluid.  No immediate complications.  Pt tolerated well.   Specimen was sent for labs.  Brayton El PA-C 04/07/2013 10:31 AM

## 2013-04-07 NOTE — Evaluation (Signed)
Physical Therapy Evaluation Patient Details Name: Jerry [name] MRN: [id number] DOB: 12/17/1940 Today's Date: 04/07/2013 Time: 4098-1191 PT Time Calculation (min): 10 min  PT Assessment / Plan / Recommendation History of Present Illness  Pt is unsure of the weight gain but feels like he did gain several pounds. He denies similar events in the past, denies fevers, chills, chest pain, no specific abdominal concerns except distension and non bloody diarrhea that also started several days prior to this admission. Pt explains he has recently been treated with ABX but he can not recall the name of the ABX. He denies recent sicknesses or hospitalizations, no sick contacts or exposures, no alcohol use.  Clinical Impression  On eval, pt was Mod I with mobility-able to ambulate ~450 feet without assistive device. No further PT needs at this time. Encouraged pt to walk at least 2x/day, as tolerated.     PT Assessment  Patent does not need any further PT services    Follow Up Recommendations  No PT follow up    Does the patient have the potential to tolerate intense rehabilitation      Barriers to Discharge        Equipment Recommendations  None recommended by PT    Recommendations for Other Services OT consult   Frequency      Precautions / Restrictions Precautions Precautions: Fall Restrictions Weight Bearing Restrictions: No   Pertinent Vitals/Pain No c/o pain      Mobility  Bed Mobility Bed Mobility: Not assessed Supine to Sit: 7: Independent Transfers Transfers: Sit to Stand;Stand to Sit Sit to Stand: 7: Independent Stand to Sit: 7: Independent Ambulation/Gait Ambulation/Gait Assistance: 6: Modified independent (Device/Increase time) Ambulation Distance (Feet): 400 Feet Assistive device: None Ambulation/Gait Assistance Details: slow gait speed. dyspnea 2/4 towards end of walk Gait Pattern: Step-through pattern    Exercises     PT Diagnosis:    PT Problem List:    PT Treatment Interventions:       PT Goals(Current goals can be found in the care plan section) Acute Rehab PT Goals Patient Stated Goal: be able to get back to traveling PT Goal Formulation: No goals set, d/c therapy  Visit Information  Last PT Received On: 04/07/13 Assistance Needed: +1 History of Present Illness: Pt is unsure of the weight gain but feels like he did gain several pounds. He denies similar events in the past, denies fevers, chills, chest pain, no specific abdominal concerns except distension and non bloody diarrhea that also started several days prior to this admission. Pt explains he has recently been treated with ABX but he can not recall the name of the ABX. He denies recent sicknesses or hospitalizations, no sick contacts or exposures, no alcohol use.       Prior Functioning  Home Living Family/patient expects to be discharged to:: Private residence Living Arrangements: Spouse/significant other Available Help at Discharge: Family Type of Home: House Home Layout: Two level;Able to live on main level with bedroom/bathroom Home Equipment: Gilmer Mor - single point Prior Function Level of Independence: Independent Comments: pt has needed A with LB dressing in last few monthes due to belly. He is interested in AE Communication Communication: No difficulties    Cognition  Cognition Arousal/Alertness: Awake/alert Behavior During Therapy: WFL for tasks assessed/performed Overall Cognitive Status: Within Functional Limits for tasks assessed    Extremity/Trunk Assessment Upper Extremity Assessment Upper Extremity Assessment: Overall WFL for tasks assessed Lower Extremity Assessment Lower Extremity Assessment: Generalized weakness   Balance  End of Session PT - End of Session Equipment Utilized During Treatment: Gait belt Activity Tolerance: Patient tolerated treatment well Patient left: in bed;with call bell/phone within reach;with family/visitor present  GP      Rebeca Alert, MPT Pager: [phone]

## 2013-04-07 NOTE — Progress Notes (Signed)
Patient has ambulated in hallways with wife several times today and has tolerated without problem.  Abdomen remains distended, paracentesis site covered with bandaid and unremarkable

## 2013-04-07 NOTE — Evaluation (Signed)
Occupational Therapy Evaluation Patient Details Name: Jerry [name] MRN: [id number] DOB: December 29, 1940 Today's Date: 04/07/2013 Time: 1201-1228 OT Time Calculation (min): 27 min  OT Assessment / Plan / Recommendation History of present illness Pt is unsure of the weight gain but feels like he did gain several pounds. He denies similar events in the past, denies fevers, chills, chest pain, no specific abdominal concerns except distension and non bloody diarrhea that also started several days prior to this admission. Pt explains he has recently been treated with ABX but he can not recall the name of the ABX. He denies recent sicknesses or hospitalizations, no sick contacts or exposures, no alcohol use.   Clinical Impression   Pt presents to OT with decreased I with some ADL activity , such as LB dressing, Pt will benefit from skilled OT to increase I with ADL activity and return to PLOF    OT Assessment  Patient needs continued OT Services    Follow Up Recommendations  No OT follow up       Equipment Recommendations  None recommended by OT       Frequency  Min 2X/week           ADL  Eating/Feeding: Independent Grooming: Supervision/safety Where Assessed - Grooming: Unsupported standing Upper Body Bathing: Independent Where Assessed - Upper Body Bathing: Unsupported sitting Lower Body Bathing: Minimal assistance Where Assessed - Lower Body Bathing: Unsupported sit to stand Upper Body Dressing: Independent Where Assessed - Upper Body Dressing: Unsupported sitting Lower Body Dressing: Minimal assistance Where Assessed - Lower Body Dressing: Unsupported sit to stand Toilet Transfer: Independent Toilet Transfer Method: Sit to stand Toilet Transfer Equipment: Comfort height toilet Toileting - Clothing Manipulation and Hygiene: Independent Where Assessed - Engineer, mining and Hygiene: Standing Tub/Shower Transfer: Designer, industrial/product Method:  Science writer: Walk in shower Transfers/Ambulation Related to ADLs: Pt needs AE for LB dressing. Pts wife able tohelp, but pt does want to be I.  Pts belly limits his ability    OT Diagnosis: Generalized weakness  OT Problem List: Decreased strength OT Treatment Interventions: Self-care/ADL training;Patient/family education;DME and/or AE instruction   OT Goals(Current goals can be found in the care plan section) Acute Rehab OT Goals Patient Stated Goal: be able to get back to traveling OT Goal Formulation: With patient Time For Goal Achievement: 04/07/13 Potential to Achieve Goals: Good  Visit Information  Last OT Received On: 04/07/13 Assistance Needed: +1 History of Present Illness: Pt is unsure of the weight gain but feels like he did gain several pounds. He denies similar events in the past, denies fevers, chills, chest pain, no specific abdominal concerns except distension and non bloody diarrhea that also started several days prior to this admission. Pt explains he has recently been treated with ABX but he can not recall the name of the ABX. He denies recent sicknesses or hospitalizations, no sick contacts or exposures, no alcohol use.       Prior Functioning     Home Living Family/patient expects to be discharged to:: Private residence Living Arrangements: Spouse/significant other Available Help at Discharge: Family Type of Home: House Home Layout: Two level;Able to live on main level with bedroom/bathroom Home Equipment: Gilmer Mor - single point Prior Function Level of Independence: Independent Comments: pt has needed A with LB dressing in last few monthes due to belly. He is interested in AE Communication Communication: No difficulties         Vision/Perception Vision - History Patient Visual Report:  No change from baseline   Cognition  Cognition Arousal/Alertness: Awake/alert Behavior During Therapy: WFL for tasks  assessed/performed Overall Cognitive Status: Within Functional Limits for tasks assessed    Extremity/Trunk Assessment Upper Extremity Assessment Upper Extremity Assessment: Overall WFL for tasks assessed     Mobility Bed Mobility Bed Mobility: Supine to Sit Supine to Sit: 7: Independent Transfers Transfers: Sit to Stand;Stand to Sit Sit to Stand: 7: Independent Stand to Sit: 7: Independent           End of Session OT - End of Session Activity Tolerance: Patient tolerated treatment well Patient left: in chair;with call bell/phone within reach;with family/visitor present  GO     Azariah Bonura, Metro Kung 04/07/2013, 12:44 PM

## 2013-04-07 NOTE — Progress Notes (Signed)
Occupational Therapy Treatment and Discharge/ Patient Details Name: Jerry [name] MRN: [id number] DOB: Apr 14, 1941 Today's Date: 04/07/2013 Time: 4098-1191 OT Time Calculation (min): 23 min  OT Assessment / Plan / Recommendation  History of present illness Pt is unsure of the weight gain but feels like he did gain several pounds. He denies similar events in the past, denies fevers, chills, chest pain, no specific abdominal concerns except distension and non bloody diarrhea that also started several days prior to this admission. Pt explains he has recently been treated with ABX but he can not recall the name of the ABX. He denies recent sicknesses or hospitalizations, no sick contacts or exposures, no alcohol use.   OT comments  Goals met.  No further OT needed  Follow Up Recommendations  No OT follow up       Equipment Recommendations  None recommended by OT       Frequency Min 2X/week   Progress towards OT Goals Progress towards OT goals: Goals met/education completed, patient discharged from OT  Plan All goals met and education completed, patient discharged from OT services    Precautions / Restrictions Precautions Precautions: Fall Restrictions Weight Bearing Restrictions: No       ADL  Eating/Feeding: Independent Grooming: Supervision/safety Where Assessed - Grooming: Unsupported standing Upper Body Bathing: Independent Where Assessed - Upper Body Bathing: Unsupported sitting Lower Body Bathing: Simulated;Modified independent (with AE) Where Assessed - Lower Body Bathing: Unsupported sit to stand Upper Body Dressing: Independent Where Assessed - Upper Body Dressing: Unsupported sitting Lower Body Dressing: Performed;Modified independent;Other (comment) (with AE) Where Assessed - Lower Body Dressing: Unsupported sit to stand Toilet Transfer: Independent Toilet Transfer Method: Sit to Barista: Comfort height toilet Toileting - Clothing  Manipulation and Hygiene: Independent Where Assessed - Engineer, mining and Hygiene: Standing Tub/Shower Transfer: Designer, industrial/product Method: Science writer: Walk in shower Transfers/Ambulation Related to ADLs: Pt needs AE for LB dressing. Pts wife able tohelp, but pt does want to be I.  Pts belly limits his ability ADL Comments: Pt educated and able to demonstrate use of AE for LB ADL activity. Pt has a reacher , but will will obtain rest of of AE for pt.  Pt thankful for the education.    OT Diagnosis: Generalized weakness  OT Problem List: Decreased strength OT Treatment Interventions: Self-care/ADL training;Patient/family education;DME and/or AE instruction   OT Goals(current goals can now be found in the care plan section) Acute Rehab OT Goals Patient Stated Goal: be able to get back to traveling OT Goal Formulation: With patient Time For Goal Achievement: 04/07/13 Potential to Achieve Goals: Good ADL Goals Pt Will Perform Lower Body Bathing: with modified independence;with adaptive equipment;sit to/from stand Pt Will Perform Lower Body Dressing: with modified independence;sit to/from stand;with adaptive equipment  Visit Information  Last OT Received On: 04/07/13 Assistance Needed: +1 History of Present Illness: Pt is unsure of the weight gain but feels like he did gain several pounds. He denies similar events in the past, denies fevers, chills, chest pain, no specific abdominal concerns except distension and non bloody diarrhea that also started several days prior to this admission. Pt explains he has recently been treated with ABX but he can not recall the name of the ABX. He denies recent sicknesses or hospitalizations, no sick contacts or exposures, no alcohol use.       Prior Functioning  Home Living Family/patient expects to be discharged to:: Private residence Living Arrangements: Spouse/significant other Available  Help  at Discharge: Family Type of Home: House Home Layout: Two level;Able to live on main level with bedroom/bathroom Home Equipment: Gilmer Mor - single point Prior Function Level of Independence: Independent Comments: pt has needed A with LB dressing in last few monthes due to belly. He is interested in AE Communication Communication: No difficulties    Cognition  Cognition Arousal/Alertness: Awake/alert Behavior During Therapy: WFL for tasks assessed/performed Overall Cognitive Status: Within Functional Limits for tasks assessed    Mobility  Bed Mobility Bed Mobility: Supine to Sit Supine to Sit: 7: Independent Transfers Transfers: Sit to Stand;Stand to Sit Sit to Stand: 7: Independent Stand to Sit: 7: Independent          End of Session OT - End of Session Activity Tolerance: Patient tolerated treatment well Patient left: in bed;with call bell/phone within reach;with family/visitor present  GO     Zayla Agar, [place] Kung 04/07/2013, 2:47 PM

## 2013-04-07 NOTE — Progress Notes (Signed)
Patient ID: Jerry [name], male   DOB: 25-Apr-1941, 72 y.o.   MRN: [id number] [facility] PROGRESS NOTE  Jerry [name] WUJ:[id number] DOB: 1941-03-31 DOA: 04/06/2013 PCP: Provider Not In System  Brief narrative::  Pt is 72 yo male who presented to [facility] ED with main concern of progressively worsening abdominal distension that he initially noted several weeks prior to this admission, now much worse and making it difficult to take deep breath. Pt is unsure of the weight gain but feels like he did gain several pounds. He denies similar events in the past, denies fevers, chills, chest pain, no specific abdominal concerns except distension and non bloody diarrhea that also started several days prior to this admission. Pt explains he has recently been treated with ABX but he can not recall the name of the ABX. He denies recent sicknesses or hospitalizations, no sick contacts or exposures, no alcohol use.   In ED, pt noted to have significant ascites on exam, confirmed with CT abd. [facility] asked to admit for further evaluation and management.   Assessment and Plan:  Principal Problem:  Acute respiratory failure  - most likely secondary to abdominal ascites  - improved post paracentesis, 6 L fluid removed  - monitor closely vitals per floor protocol  Active Problems:  Ascites  - unclear etiology, cirrhosis evident on CT abd and pelvis and ? Alcohol induced liver damage  - appreciate IR input - therapeutic and diagnostic paracentesis done today, 6 L fluid removed and analysis pending  - continue Lasix and monitor I's and O's Diarrhea  - unclear etiology and given recent use of ABX, C. Diff PCR pending  - monitor electrolytes and supplement as indicated  Acute on chronic renal failure  - likely pre renal component imposed on chronic renal failure  - continue to hold ARB, Metformin, Dyazide  - creatinine is trending down  - repeat BMP in AM  Transaminitis  - unclear etiology and appears to be in  the pattern of alcohol use  - pt denies - will trend while inpatient  Elevated lipase  - given transaminitis there is certainly suspicion of alcohol use but pt denies  - lipase is within normal limits this AM - treat with analgesia and antiemetics as needed  Hyponatremia  - most likely secondary to volume status  - monitor closely  Diabetes mellitus  - A1C pending - continue to hold Metformin due to acute renal failure  - SSI for now  - monitor closely, BMP in AM  Consultants:  IR  Procedures/Studies: Ct Abdomen Pelvis Wo Contrast  04/06/2013    1. Cirrhosis with evidence of portal hypertension. No splenomegaly  2. Moderate volume ascites with diffuse anasarca.  3. Enlarged 1.6 cm gastrohepatic lymph node. Additional shotty adenopathy within the porta hepatis.  4. Colonic diverticulosis without evidence of acute diverticulitis.  5. Enlarged prostate.     Dg Chest 2 View  04/06/2013   Hypoinflation with mild bibasilar subsegmental atelectasis. No active cardiopulmonary process identified.    Antibiotics:  None  Code Status: Full Family Communication: Pt at bedside Disposition Plan: Home when medically stable  HPI/Subjective: No events overnight.   Objective: Filed Vitals:   04/07/13 0935 04/07/13 1000 04/07/13 1009 04/07/13 1021  BP: 134/65 116/64 108/63 110/60  Pulse:      Temp:      TempSrc:      Resp:      Height:      Weight:      SpO2:  Intake/Output Summary (Last 24 hours) at 04/07/13 1113 Last data filed at 04/06/13 1730  Gross per 24 hour  Intake    240 ml  Output      0 ml  Net    240 ml    Exam:   General:  Pt is alert, follows commands appropriately, not in acute distress  Cardiovascular: Regular rate and rhythm, S1/S2, no murmurs, no rubs, no gallops  Respiratory: Clear to auscultation bilaterally, no wheezing, no crackles, no rhonchi  Abdomen: Soft, non tender, less distended, bowel sounds present, no guarding  Extremities:  +1 bilateral LE pitting edema, pulses DP and PT palpable bilaterally  Neuro: Grossly nonfocal  Data Reviewed: Basic Metabolic Panel:  Recent Labs Lab 04/06/13 0518 04/06/13 1118 04/07/13 0500  NA 127*  --  127*  K 3.9  --  3.7  CL 92*  --  93*  CO2 21  --  23  GLUCOSE 71  --  131*  BUN 38*  --  35*  CREATININE 1.75*  --  1.42*  CALCIUM 9.0  --  8.4  MG  --  2.3  --   PHOS  --  3.4  --    Liver Function Tests:  Recent Labs Lab 04/06/13 0518 04/07/13 0500  AST 45* 43*  ALT 21 20  ALKPHOS 50 47  BILITOT 0.8 0.8  PROT 7.0 6.1  ALBUMIN 3.5 3.0*    Recent Labs Lab 04/06/13 0518 04/07/13 0500  LIPASE 70* 52   CBC:  Recent Labs Lab 04/06/13 0518 04/07/13 0500  WBC 10.4 7.7  NEUTROABS 7.1  --   HGB 12.6* 11.6*  HCT 36.4* 33.3*  MCV 89.9 89.5  PLT 158 120*   Cardiac Enzymes:  Recent Labs Lab 04/06/13 0518  TROPONINI <0.30   CBG:  Recent Labs Lab 04/06/13 1132 04/06/13 1704 04/06/13 2126 04/07/13 0728  GLUCAP 72 119* 150* 134*    Recent Results (from the past 240 hour(s))  CLOSTRIDIUM DIFFICILE BY PCR     Status: None   Collection Time    04/06/13  5:43 AM      Result Value Range Status   C difficile by pcr NEGATIVE  NEGATIVE Final   Comment: Performed at [facility]     Scheduled Meds: . enoxaparin (LOVENOX) injection  30 mg Subcutaneous Q24H  . pantoprazole  40 mg Oral Daily  . sodium chloride  3 mL Intravenous Q12H   Continuous Infusions:    Debbora Presto, MD  [facility] Pager [phone]  If 7PM-7AM, please contact night-coverage [email or web address] Password [email or web address] 04/07/2013, 11:13 AM   LOS: 1 day

## 2013-04-07 NOTE — Progress Notes (Signed)
Discover patient had returned from radiology following a paracentesis, vital signs charted, right abdominal drainage site oozing slight blood, bandaid changed.  Patient states he feels fine and has no pain at this time

## 2013-04-08 ENCOUNTER — Inpatient Hospital Stay (HOSPITAL_COMMUNITY): Payer: Medicare Other

## 2013-04-08 ENCOUNTER — Other Ambulatory Visit: Payer: Medicare Other

## 2013-04-08 LAB — BASIC METABOLIC PANEL
CO2: 26 mEq/L (ref 19–32)
Calcium: 8.3 mg/dL — ABNORMAL LOW (ref 8.4–10.5)
Creatinine, Ser: 1.27 mg/dL (ref 0.50–1.35)
GFR calc Af Amer: 63 mL/min — ABNORMAL LOW (ref >90)
Potassium: 3.5 mEq/L (ref 3.5–5.1)
Sodium: 128 mEq/L — ABNORMAL LOW (ref 135–145)

## 2013-04-08 LAB — GLUCOSE, CAPILLARY
Glucose-Capillary: 114 mg/dL — ABNORMAL HIGH (ref 70–99)
Glucose-Capillary: 137 mg/dL — ABNORMAL HIGH (ref 70–99)
Glucose-Capillary: 130 mg/dL — ABNORMAL HIGH (ref 70–99)

## 2013-04-08 LAB — CBC
MCH: 30.4 pg (ref 26.0–34.0)
MCV: 88.6 fL (ref 78.0–100.0)
Platelets: 109 10*3/uL — ABNORMAL LOW (ref 150–400)
RBC: 3.68 MIL/uL — ABNORMAL LOW (ref 4.22–5.81)
RDW: 14.9 % (ref 11.5–15.5)
WBC: 5.5 10*3/uL (ref 4.0–10.5)

## 2013-04-08 MED ORDER — INSULIN ASPART 100 UNIT/ML ~~LOC~~ SOLN
0.0000 [IU] | Freq: Three times a day (TID) | SUBCUTANEOUS | Status: DC
  Administered 2013-04-08 – 2013-04-09 (×3): 1 [IU] via SUBCUTANEOUS

## 2013-04-08 MED ORDER — SACCHAROMYCES BOULARDII 250 MG PO CAPS
250.0000 mg | ORAL_CAPSULE | Freq: Two times a day (BID) | ORAL | Status: DC
  Administered 2013-04-08 – 2013-04-09 (×2): 250 mg via ORAL
  Filled 2013-04-08 (×3): qty 1

## 2013-04-08 MED ORDER — FUROSEMIDE 10 MG/ML IJ SOLN
40.0000 mg | Freq: Every day | INTRAMUSCULAR | Status: DC
  Administered 2013-04-09: 40 mg via INTRAVENOUS
  Filled 2013-04-08: qty 4

## 2013-04-08 MED ORDER — SPIRONOLACTONE 25 MG PO TABS
25.0000 mg | ORAL_TABLET | Freq: Every day | ORAL | Status: DC
  Administered 2013-04-08 – 2013-04-09 (×2): 25 mg via ORAL
  Filled 2013-04-08 (×2): qty 1

## 2013-04-08 NOTE — Progress Notes (Signed)
Patient ID: Jerry [name], male   DOB: 1941-02-26, 72 y.o.   MRN: [id number] [facility] PROGRESS NOTE  Jerry [name] BJY:[id number] DOB: 23-May-1941 DOA: 04/06/2013 PCP: Provider Not In System  Brief narrative::  Pt is 72 yo male who presented to [facility] ED with main concern of progressively worsening abdominal distension that he initially noted several weeks prior to this admission, now much worse and making it difficult to take deep breath. Pt is unsure of the weight gain but feels like he did gain several pounds. He denies similar events in the past, denies fevers, chills, chest pain, no specific abdominal concerns except distension and non bloody diarrhea that also started several days prior to this admission. Pt explains he has recently been treated with ABX but he can not recall the name of the ABX. He denies recent sicknesses or hospitalizations, no sick contacts or exposures, no alcohol use.   In ED, pt noted to have significant ascites on exam, confirmed with CT abd. [facility] asked to admit for further evaluation and management.   Assessment and Plan:  Principal Problem:  Acute respiratory failure  - most likely secondary to abdominal ascites  - improved post paracentesis, 6 L fluid removed (10/20) - monitor closely vitals per floor protocol  Active Problems:  Ascites  - unclear etiology, cirrhosis evident on CT abd and pelvis and ? Alcohol induced liver damage but pt denies - appreciate IR input  - therapeutic and diagnostic paracentesis done 10/20, 6 L fluid removed and analysis pending  - continue Lasix but increase the dose and add spironolactone, monitor I's and O's  - will ask IR to drain more fluid if possible today - GI consult requested as well  Diarrhea  - unclear etiology, C. Diff PCR negative  - monitor electrolytes and supplement as indicated  Acute on chronic renal failure  - likely pre renal component imposed on chronic renal failure  - continue to hold ARB,  Metformin, Dyazide  - creatinine is trending down and is within normal limits this AM - repeat BMP in AM  Transaminitis  - unclear etiology and appears to be in the pattern of alcohol use  - pt denies alcohol use but explains his father died of liver cirrhosis in the setting of alcohol abuse  Elevated lipase  - given transaminitis there is certainly suspicion of alcohol use but pt denies  - lipase is within normal limits 10/20 - treat with analgesia and antiemetics as needed  Hyponatremia  - most likely secondary to volume status, 127 --> 128  - monitor closely  Diabetes mellitus  - A1C 6.9 - continue to hold Metformin due to acute renal failure  - SSI for now  - if renal function improves, may restart Metformin  - monitor closely, BMP in AM   Consultants:  IR GI  Procedures/Studies:  Ct Abdomen Pelvis Wo Contrast 04/06/2013  1. Cirrhosis with evidence of portal hypertension. No splenomegaly  2. Moderate volume ascites with diffuse anasarca.  3. Enlarged 1.6 cm gastrohepatic lymph node. Additional shotty adenopathy within the porta hepatis.  4. Colonic diverticulosis without evidence of acute diverticulitis.  5. Enlarged prostate.  Dg Chest 2 View 04/06/2013  Hypoinflation with mild bibasilar subsegmental atelectasis. No active cardiopulmonary process identified.  Antibiotics:  None  Code Status: Full  Family Communication: Pt at bedside  Disposition Plan: Home when medically stable   HPI/Subjective: No events overnight.   Objective: Filed Vitals:   04/07/13 1114 04/07/13 1400 04/07/13 2130 04/08/13 0600  BP: 122/79 115/74 105/67 121/70  Pulse: 98 96 96 86  Temp: 98.4 F (36.9 C) 97.9 F (36.6 C) 98.2 F (36.8 C) 98.4 F (36.9 C)  TempSrc: Oral Oral Oral Oral  Resp: 18 20 20 20   Height:      Weight:      SpO2: 90% 90% 91% 92%    Intake/Output Summary (Last 24 hours) at 04/08/13 1054 Last data filed at 04/07/13 1921  Gross per 24 hour  Intake      0 ml   Output    800 ml  Net   -800 ml    Exam:   General:  Pt is alert, follows commands appropriately, not in acute distress  Cardiovascular: Regular rate and rhythm, S1/S2, no murmurs, no rubs, no gallops  Respiratory: Clear to auscultation bilaterally, no wheezing, no crackles, no rhonchi  Abdomen: Soft, non tender, distended, bowel sounds present, no guarding  Extremities: No edema, pulses DP and PT palpable bilaterally  Neuro: Grossly nonfocal  Data Reviewed: Basic Metabolic Panel:  Recent Labs Lab 04/06/13 0518 04/06/13 1118 04/07/13 0500 04/08/13 0600  NA 127*  --  127* 128*  K 3.9  --  3.7 3.5  CL 92*  --  93* 94*  CO2 21  --  23 26  GLUCOSE 71  --  131* 129*  BUN 38*  --  35* 31*  CREATININE 1.75*  --  1.42* 1.27  CALCIUM 9.0  --  8.4 8.3*  MG  --  2.3  --   --   PHOS  --  3.4  --   --    Liver Function Tests:  Recent Labs Lab 04/06/13 0518 04/07/13 0500  AST 45* 43*  ALT 21 20  ALKPHOS 50 47  BILITOT 0.8 0.8  PROT 7.0 6.1  ALBUMIN 3.5 3.0*    Recent Labs Lab 04/06/13 0518 04/07/13 0500  LIPASE 70* 52   CBC:  Recent Labs Lab 04/06/13 0518 04/07/13 0500 04/08/13 0600  WBC 10.4 7.7 5.5  NEUTROABS 7.1  --   --   HGB 12.6* 11.6* 11.2*  HCT 36.4* 33.3* 32.6*  MCV 89.9 89.5 88.6  PLT 158 120* 109*   Cardiac Enzymes:  Recent Labs Lab 04/06/13 0518  TROPONINI <0.30   CBG:  Recent Labs Lab 04/07/13 0728 04/07/13 1118 04/07/13 1646 04/07/13 2050 04/08/13 0730  GLUCAP 134* 138* 129* 154* 114*    Recent Results (from the past 240 hour(s))  CLOSTRIDIUM DIFFICILE BY PCR     Status: None   Collection Time    04/06/13  5:43 AM      Result Value Range Status   C difficile by pcr NEGATIVE  NEGATIVE Final   Comment: Performed at [facility]  BODY FLUID CULTURE     Status: None   Collection Time    04/07/13  9:52 AM      Result Value Range Status   Specimen Description FLUID ASCITES   Final   Special Requests NONE    Final   Gram Stain     Final   Value: NO WBC SEEN     NO ORGANISMS SEEN     Performed at [facility]   Culture     Final   Value: NO GROWTH 1 DAY     Performed at [facility]   Report Status PENDING   Incomplete     Scheduled Meds: . enoxaparin (LOVENOX) injection  30 mg Subcutaneous Q24H  .  furosemide  20 mg Intravenous Daily  . pantoprazole  40 mg Oral Daily  . sodium chloride  3 mL Intravenous Q12H   Continuous Infusions:   Debbora Presto, MD  [facility] Pager [phone]  If 7PM-7AM, please contact night-coverage [email or web address] Password [email or web address] 04/08/2013, 10:54 AM   LOS: 2 days

## 2013-04-08 NOTE — Procedures (Signed)
US guided diagnostic/therapeutic paracentesis performed yielding 3.4 liters chylous/milky fluid. No immediate complications. A portion of the fluid was sent for cytology.

## 2013-04-08 NOTE — Consult Note (Signed)
[facility] Gastroenterology Consultation Note  Referring Provider: Dr. Sherlon Handing [facility]) Primary Gastroenterologist:  Dr. Bernette Redbird  Reason for Consultation:  ascites  HPI: Jerry [name] is a 72 y.o. male admitted for progressive abdominal distention, over past few weeks, resulting in trouble breathing.  Also some lower extremity swelling.  CT showed ascites, had 6 L removed yesterday, cloudy but PMN < 250 mm3.  Mild lower abdominal pain, now improving after antibiotics (originally given as outpatient for diverticulitis).  Some mild diarrhea.  No blood in stool.  Abdominal distention improving.  Denies alcohol.  Denies prior personal history of liver troubles.   Past Medical History  Diagnosis Date  . Diabetes mellitus without complication   . Hypertension     Past Surgical History  Procedure Laterality Date  . Back surgery    . Hernia repair      Prior to Admission medications   Medication Sig Start Date End Date Taking? Authorizing Provider  glyBURIDE micronized (GLYNASE) 3 MG tablet Take 3 mg by mouth daily with breakfast.  03/26/13  Yes Historical Provider, MD  losartan (COZAAR) 100 MG tablet Take 100 mg by mouth daily.  03/25/13  Yes Historical Provider, MD  metFORMIN (GLUCOPHAGE-XR) 500 MG 24 hr tablet Take 500 mg by mouth daily with breakfast.  02/07/13  Yes Historical Provider, MD  omeprazole-sodium bicarbonate (ZEGERID) 40-1100 MG per capsule Take 1 capsule by mouth daily before breakfast.  04/01/13  Yes Historical Provider, MD  simvastatin (ZOCOR) 10 MG tablet Take 10 mg by mouth at bedtime.   Yes Historical Provider, MD  triamterene-hydrochlorothiazide (DYAZIDE) 37.5-25 MG per capsule Take 1 capsule by mouth daily.  03/26/13  Yes Historical Provider, MD    Current Facility-Administered Medications  Medication Dose Route Frequency Provider Last Rate Last Dose  . 0.9 %  sodium chloride infusion  250 mL Intravenous PRN Dorothea Ogle, MD      . enoxaparin (LOVENOX)  injection 30 mg  30 mg Subcutaneous Q24H Dorothea Ogle, MD   30 mg at 04/07/13 1205  . [START ON 04/09/2013] furosemide (LASIX) injection 40 mg  40 mg Intravenous Daily Dorothea Ogle, MD      . HYDROcodone-acetaminophen (NORCO/VICODIN) 5-325 MG per tablet 1-2 tablet  1-2 tablet Oral Q4H PRN Dorothea Ogle, MD      . HYDROmorphone (DILAUDID) injection 1 mg  1 mg Intravenous Q2H PRN Dorothea Ogle, MD      . insulin aspart (novoLOG) injection 0-9 Units  0-9 Units Subcutaneous TID WC Dorothea Ogle, MD      . ondansetron [facility]) tablet 4 mg  4 mg Oral Q6H PRN Dorothea Ogle, MD       Or  . ondansetron [facility]) injection 4 mg  4 mg Intravenous Q6H PRN Dorothea Ogle, MD      . pantoprazole (PROTONIX) EC tablet 40 mg  40 mg Oral Daily Dorothea Ogle, MD   40 mg at 04/08/13 1028  . sodium chloride 0.9 % injection 3 mL  3 mL Intravenous Q12H Dorothea Ogle, MD   3 mL at 04/08/13 1029  . sodium chloride 0.9 % injection 3 mL  3 mL Intravenous PRN Dorothea Ogle, MD      . spironolactone (ALDACTONE) tablet 25 mg  25 mg Oral Daily Dorothea Ogle, MD        Allergies as of 04/06/2013  . (No Known Allergies)    History reviewed. No pertinent family history.  History  Social History  . Marital Status: Married    Spouse Name: N/A    Number of Children: N/A  . Years of Education: N/A   Occupational History  . Not on file.   Social History Main Topics  . Smoking status: Former Games developer  . Smokeless tobacco: Not on file  . Alcohol Use: No  . Drug Use: No  . Sexual Activity: No   Other Topics Concern  . Not on file   Social History Narrative  . No narrative on file    Review of Systems: As per HPI; all others negative.  Physical Exam: Vital signs in last 24 hours: Temp:  [98.2 F (36.8 C)-98.4 F (36.9 C)] 98.4 F (36.9 C) (10/21 0600) Pulse Rate:  [86-96] 86 (10/21 0600) Resp:  [20] 20 (10/21 0600) BP: (105-121)/(67-70) 121/70 mmHg (10/21 0600) SpO2:  [91 %-92 %] 92 % (10/21 0600) Last  BM Date: 04/07/13 General:   Alert,  Chronically ill-appearing, overweight, NAD Head:  Normocephalic and atraumatic. Eyes:  Sclera clear, no icterus.   Conjunctiva pink. Ears:  Normal auditory acuity. Nose:  No deformity, discharge,  or lesions. Mouth:  No deformity or lesions.  Oropharynx pink & moist. Neck:  Supple; no masses or thyromegaly. Lungs:  Clear throughout to auscultation.   No wheezes, crackles, or rhonchi. No acute distress. Heart:  Regular rate and rhythm; no murmurs, clicks, rubs,  or gallops. Abdomen:  Soft, nontender and nondistended. No masses, hepatosplenomegaly or hernias noted. Normal bowel sounds, without guarding, and without rebound.     Msk:  Symmetrical without gross deformities. Normal posture. Pulses:  Normal pulses noted. Extremities:  1+ edema bilateral lower extremities Neurologic:  Alert and  oriented x4;  Diffusely weak, otherwise grossly normal neurologically. Skin:  Scattered ecchymoses and telangiectasias; otherwise itact without significant lesions or rashes. Psych:  Alert and cooperative. Normal mood and affect.  Lab Results:  Recent Labs  04/06/13 0518 04/07/13 0500 04/08/13 0600  WBC 10.4 7.7 5.5  HGB 12.6* 11.6* 11.2*  HCT 36.4* 33.3* 32.6*  PLT 158 120* 109*   BMET  Recent Labs  04/06/13 0518 04/07/13 0500 04/08/13 0600  NA 127* 127* 128*  K 3.9 3.7 3.5  CL 92* 93* 94*  CO2 21 23 26   GLUCOSE 71 131* 129*  BUN 38* 35* 31*  CREATININE 1.75* 1.42* 1.27  CALCIUM 9.0 8.4 8.3*   LFT  Recent Labs  04/07/13 0500  PROT 6.1  ALBUMIN 3.0*  AST 43*  ALT 20  ALKPHOS 47  BILITOT 0.8   PT/INR  Recent Labs  04/06/13 1118  LABPROT 16.4*  INR 1.36    Studies/Results: US Paracentesis  04/07/2013   CLINICAL DATA:  Abdominal distention, cirrhosis. Ascites. Request diagnostic and therapeutic paracentesis.  EXAM: ULTRASOUND GUIDED PARACENTESIS  COMPARISON:  None.  PROCEDURE: An ultrasound guided paracentesis was thoroughly  discussed with the patient and questions answered. The benefits, risks, alternatives and complications were also discussed. The patient understands and wishes to proceed with the procedure. Written consent was obtained.  Ultrasound was performed to localize and mark an adequate pocket of fluid in the right lower quadrant of the abdomen. The area was then prepped and draped in the normal sterile fashion. 1% Lidocaine was used for local anesthesia. Under ultrasound guidance a 19 gauge Yueh catheter was introduced. Paracentesis was performed. The catheter was removed and a dressing applied.  COMPLICATIONS: None immediate  FINDINGS: A total of approximately 6 L of cloudy cream colored fluid was  removed. A fluid sample was sent for laboratory analysis.  IMPRESSION: Successful ultrasound guided paracentesis yielding 6 L of ascites.  Read by: Brayton El PA-C   Electronically Signed   By: Richarda Overlie M.D.   On: 04/07/2013 10:52    Impression:  1.  New onset of ascites with radiographic evidence of cirrhosis.  Suspect Non-alcoholic fatty liver disease (NAFLD) as source. PMN < 250/mm3 on recent paracentesis. 2.  Diarrhea, recent antibiotics, C. Diff pcr negative.  Plan:  1.   Low sodium diet. 2.   Diuretics (furosemide and spironolactone). 3.   Florastor 250 mg bid for diarrhea. 4.  Patient will need outpatient follow-up for liver serologies (Hep A/B/C, iron profile, ferritin, ceruloplasmin, ana, ama, asma, immunoglobulin), and will need elective outpatient EGD for hepatoma screening. 5.  Hopefully home tomorrow, with outpatient follow-up for above testing, with primary GI doctor (Dr. Matthias Hughs).   LOS: 2 days   Darryll Raju M  04/08/2013, 2:31 PM

## 2013-04-09 LAB — BASIC METABOLIC PANEL
BUN: 25 mg/dL — ABNORMAL HIGH (ref 6–23)
Calcium: 8.6 mg/dL (ref 8.4–10.5)
Creatinine, Ser: 1.16 mg/dL (ref 0.50–1.35)
GFR calc Af Amer: 70 mL/min — ABNORMAL LOW (ref >90)
GFR calc non Af Amer: 60 mL/min — ABNORMAL LOW (ref >90)
Glucose, Bld: 120 mg/dL — ABNORMAL HIGH (ref 70–99)
Sodium: 128 mEq/L — ABNORMAL LOW (ref 135–145)

## 2013-04-09 LAB — CBC
HCT: 32.2 % — ABNORMAL LOW (ref 39.0–52.0)
Hemoglobin: 11 g/dL — ABNORMAL LOW (ref 13.0–17.0)
MCH: 30.4 pg (ref 26.0–34.0)
MCHC: 34.2 g/dL (ref 30.0–36.0)
MCV: 89 fL (ref 78.0–100.0)
RBC: 3.62 MIL/uL — ABNORMAL LOW (ref 4.22–5.81)
RDW: 14.8 % (ref 11.5–15.5)

## 2013-04-09 MED ORDER — SACCHAROMYCES BOULARDII 250 MG PO CAPS: 250.0000 mg | ORAL_CAPSULE | Freq: Two times a day (BID) | ORAL | Status: DC

## 2013-04-09 MED ORDER — INFLUENZA VAC SPLIT QUAD 0.5 ML IM SUSP
0.5000 mL | Freq: Once | INTRAMUSCULAR | Status: AC
  Administered 2013-04-09: 0.5 mL via INTRAMUSCULAR
  Filled 2013-04-09: qty 0.5

## 2013-04-09 MED ORDER — LOSARTAN POTASSIUM 100 MG PO TABS: 50.0000 mg | ORAL_TABLET | Freq: Every day | ORAL | Status: DC

## 2013-04-09 MED ORDER — INFLUENZA VAC SPLIT QUAD 0.5 ML IM SUSP: 0.5000 mL | INTRAMUSCULAR | Status: DC

## 2013-04-09 MED ORDER — POTASSIUM CHLORIDE CRYS ER 20 MEQ PO TBCR
40.0000 meq | EXTENDED_RELEASE_TABLET | Freq: Once | ORAL | Status: AC
  Administered 2013-04-09: 40 meq via ORAL
  Filled 2013-04-09: qty 2

## 2013-04-09 MED ORDER — SPIRONOLACTONE 25 MG PO TABS: 50.0000 mg | ORAL_TABLET | Freq: Every day | ORAL | Status: DC

## 2013-04-09 MED ORDER — FUROSEMIDE 40 MG PO TABS: 40.0000 mg | ORAL_TABLET | Freq: Every day | ORAL | Status: DC

## 2013-04-09 NOTE — Discharge Summary (Signed)
Physician Discharge Summary  Jerry [name] WUJ:[id number] DOB: [birth year] DOA: 04/06/2013  PCP: Provider Not In System  Admit date: 04/06/2013 Discharge date: 04/09/2013  Time spent: > 35 minutes  Recommendations for Outpatient Follow-up:  1. Follow up with PCP in 3-5 days 2. Follow up with GI as an outpatient   Recommendations for primary care physician for things to follow:  Repeat BMP  Discharge Diagnoses:  Principal Problem:   Acute respiratory failure Active Problems:   Ascites   Acute on chronic renal failure   Transaminitis   Elevated lipase   Hyponatremia  Discharge Condition: stable  Diet recommendation: low salt  Filed Weights   04/06/13 0948 04/07/13 0542 04/09/13 0630  Weight: 108.4 kg (238 lb 15.7 oz) 108.6 kg (239 lb 6.7 oz) 98.476 kg (217 lb 1.6 oz)   History of present illness:  Pt is 72 yo male who presented to [facility] ED with main concern of progressively worsening abdominal distension that he initially noted several weeks prior to this admission, now much worse and making it difficult to take deep breath. Pt is unsure of the weight gain but feels like he did gain several pounds. He denies similar events in the past, denies fevers, chills, chest pain, no specific abdominal concerns except distension and non bloody diarrhea that also started several days prior to this admission. Pt explains he has recently been treated with ABX but he can not recall the name of the ABX. He denies recent sicknesses or hospitalizations, no sick contacts or exposures, no alcohol use. In ED, pt noted to have significant ascites on exam, confirmed with CT abd. [facility] asked to admit for further evaluation and management.    Hospital Course:  Acute respiratory failure - most likely secondary to abdominal ascites, completely resolved after paracentesis.  Ascites - likely due tp cirrhosis evident on CT abd and pelvis> As far as etiology of his cirrhosis is not fully clear, denies ETOH, ?NAFLD.  He will follow up closely with Dr. Matthias Hughs as an outpatient. Fluid analysis without evidence of SBP with PMNs < 250. He was started on Lasix and Spironolactone and counseled for a low salt diet. He underwent US guided paracentesis x 2, with 6L removed initially and 3.4 L the second time. Advised daily weights.  Liver cirrhosis - GI has been consulted while patient hospitalized. He is to follow up as an outpatient for further screening and investigations into etiology.  Diarrhea - unclear etiology, C. Diff PCR negative. Improved.  Acute on chronic renal failure - improved. Decreased his ARB on discharge and advised to follow up with his PCP for repeat blood work. Cr improving to 1.16 on discharge.  Transaminitis - minimal.  Elevated lipase - resolved Hyponatremia - most likely secondary to volume status, 127 --> 128. He is to have a low salt diet and counseled for fluid restriction at home. Will likely need repeat blood work at his next appointment.  Diabetes mellitus - A1C 6.9  Thrombocytopenia - likely due to liver disease. Stable, without evidence of bleeding.  HTN - I discontinued his Dyazide as his BP was well controlled here and he is on now 2 diuretics. He is to continue his ARB at lower dose. He was advised to check his BP as he is usually doing and call his doctor immediately if he is hypotensive.   Procedures:  US guided paracentesis 10/20 and 10/21   Consultations:  Gastroenterology  Discharge Exam: Filed Vitals:   04/08/13 1627 04/08/13 1638 04/08/13 2200  04/09/13 0630  BP: 114/69 100/66 110/61 125/74  Pulse:   96 92  Temp:   98.8 F (37.1 C) 98.5 F (36.9 C)  TempSrc:   Oral Oral  Resp:   20 20  Height:      Weight:    98.476 kg (217 lb 1.6 oz)  SpO2:   94% 98%   General: NAD Cardiovascular: RRR Respiratory: CTA biL  Discharge Instructions    Medication List    STOP taking these medications       triamterene-hydrochlorothiazide 37.5-25 MG per capsule  Commonly  known as:  DYAZIDE      TAKE these medications       furosemide 40 MG tablet  Commonly known as:  LASIX  Take 1 tablet (40 mg total) by mouth daily.     glyBURIDE micronized 3 MG tablet  Commonly known as:  GLYNASE  Take 3 mg by mouth daily with breakfast.     losartan 100 MG tablet  Commonly known as:  COZAAR  Take 0.5 tablets (50 mg total) by mouth daily.     metFORMIN 500 MG 24 hr tablet  Commonly known as:  GLUCOPHAGE-XR  Take 500 mg by mouth daily with breakfast.     omeprazole-sodium bicarbonate 40-1100 MG per capsule  Commonly known as:  ZEGERID  Take 1 capsule by mouth daily before breakfast.     saccharomyces boulardii 250 MG capsule  Commonly known as:  FLORASTOR  Take 1 capsule (250 mg total) by mouth 2 (two) times daily.     simvastatin 10 MG tablet  Commonly known as:  ZOCOR  Take 10 mg by mouth at bedtime.     spironolactone 25 MG tablet  Commonly known as:  ALDACTONE  Take 2 tablets (50 mg total) by mouth daily.           Follow-up Information   Follow up with Florencia Reasons, MD. Schedule an appointment as soon as possible for a visit in 1 week.   Specialty:  Gastroenterology   Contact information:   [address] [place] [address] [phone]       The results of significant diagnostics from this hospitalization (including imaging, microbiology, ancillary and laboratory) are listed below for reference.    Significant Diagnostic Studies: Ct Abdomen Pelvis Wo Contrast  04/06/2013   CLINICAL DATA:  Abdominal distention and diarrhea  EXAM: CT ABDOMEN AND PELVIS WITHOUT CONTRAST  TECHNIQUE: Multidetector CT imaging of the abdomen and pelvis was performed following the standard protocol without intravenous contrast.  COMPARISON:  None.  FINDINGS: Linear atelectasis in size or scarring is seen within both lower lobes.  The liver is small with a nodular contour or, compatible with cirrhosis. Recannulized periumbilical vein is  noted, consistent with portal hypertension. No focal intrahepatic mass is identified. Gallbladder is within normal limits. No biliary ductal dilatation. The spleen is not enlarged measuring 13.1 cm in craniocaudal dimension. Adrenal glands and pancreas are within normal limits.  Limited noncontrast evaluation of the kidneys is grossly unremarkable without evidence of nephrolithiasis or hydronephrosis.  Small hiatal hernia is noted. There is no evidence of bowel obstruction. Multiple scattered colonic diverticula are present without CT evidence of acute diverticulitis. Appendix is not definitely visualized. No inflammatory changes are seen about the bowels.  Bladder is unremarkable. The prostate is enlarged measuring 6 mm 6.1 mm and transverse diameter.  Moderate volume ascites is present throughout the abdomen and pelvis. No free air identified.  Multiple prominent  lymph nodes are seen in the region of the porta hepatis. The largest of these is seen in the gastrohepatic region and measures 1.5 cm 1.6 cm in short axis (series 2, image 32).  Multilevel degenerative changes are noted within the visualized spine. No acute osseous abnormality identified.  Diffuse anasarca is noted.  IMPRESSION: 1. Cirrhosis with evidence of portal hypertension. No splenomegaly 2. Moderate volume ascites with diffuse anasarca. 3. Enlarged 1.6 cm gastrohepatic lymph node. Additional shotty adenopathy within the porta hepatis. 4. Colonic diverticulosis without evidence of acute diverticulitis. 5. Enlarged prostate.   Electronically Signed   By: Rise Mu M.D.   On: 04/06/2013 06:51   Dg Chest 2 View  04/06/2013   CLINICAL DATA:  Shortness of breath  EXAM: CHEST  2 VIEW  COMPARISON:  Prior radiograph from 03/31/2013  FINDINGS: The cardiac and mediastinal silhouettes are stable in size and contour.  The lungs are hypoinflated. Bibasilar linear opacities are most compatible with atelectasis. No focal infiltrate identified. No  pulmonary edema or pleural effusion. No pneumothorax.  Osseous structures are unchanged.  IMPRESSION: Hypoinflation with mild bibasilar subsegmental atelectasis. No active cardiopulmonary process identified.   Electronically Signed   By: Rise Mu M.D.   On: 04/06/2013 05:13   Dg Chest 2 View  03/31/2013   CLINICAL DATA:  Cough. Chest pain. Shortness of breath.  EXAM: CHEST  2 VIEW  COMPARISON:  01/30/2010  FINDINGS: Low lung volumes are present, causing crowding of the pulmonary vasculature. Bandlike opacities and both lung bases favor atelectasis.  Mild atherosclerotic calcification of the aortic arch noted. Cardiac and mediastinal margins appear normal. No pleural effusion noted.  IMPRESSION: 1. Low lung volumes with bandlike atelectasis in both lung bases. 2. Aortic atherosclerosis.   Electronically Signed   By: Herbie Baltimore M.D.   On: 03/31/2013 14:51   US Paracentesis  04/07/2013   CLINICAL DATA:  Abdominal distention, cirrhosis. Ascites. Request diagnostic and therapeutic paracentesis.  EXAM: ULTRASOUND GUIDED PARACENTESIS  COMPARISON:  None.  PROCEDURE: An ultrasound guided paracentesis was thoroughly discussed with the patient and questions answered. The benefits, risks, alternatives and complications were also discussed. The patient understands and wishes to proceed with the procedure. Written consent was obtained.  Ultrasound was performed to localize and mark an adequate pocket of fluid in the right lower quadrant of the abdomen. The area was then prepped and draped in the normal sterile fashion. 1% Lidocaine was used for local anesthesia. Under ultrasound guidance a 19 gauge Yueh catheter was introduced. Paracentesis was performed. The catheter was removed and a dressing applied.  COMPLICATIONS: None immediate  FINDINGS: A total of approximately 6 L of cloudy cream colored fluid was removed. A fluid sample was sent for laboratory analysis.  IMPRESSION: Successful ultrasound guided  paracentesis yielding 6 L of ascites.  Read by: Brayton El PA-C   Electronically Signed   By: Richarda Overlie M.D.   On: 04/07/2013 10:52    Microbiology: Recent Results (from the past 240 hour(s))  CLOSTRIDIUM DIFFICILE BY PCR     Status: None   Collection Time    04/06/13  5:43 AM      Result Value Range Status   C difficile by pcr NEGATIVE  NEGATIVE Final   Comment: Performed at [facility]  BODY FLUID CULTURE     Status: None   Collection Time    04/07/13  9:52 AM      Result Value Range Status   Specimen Description FLUID  ASCITES   Final   Special Requests NONE   Final   Gram Stain     Final   Value: NO WBC SEEN     NO ORGANISMS SEEN     Performed at [facility]   Culture     Final   Value: NO GROWTH 1 DAY     Performed at [facility]   Report Status PENDING   Incomplete     Labs: Basic Metabolic Panel:  Recent Labs Lab 04/06/13 0518 04/06/13 1118 04/07/13 0500 04/08/13 0600 04/09/13 0538  NA 127*  --  127* 128* 128*  K 3.9  --  3.7 3.5 3.3*  CL 92*  --  93* 94* 93*  CO2 21  --  23 26 25   GLUCOSE 71  --  131* 129* 120*  BUN 38*  --  35* 31* 25*  CREATININE 1.75*  --  1.42* 1.27 1.16  CALCIUM 9.0  --  8.4 8.3* 8.6  MG  --  2.3  --   --   --   PHOS  --  3.4  --   --   --    Liver Function Tests:  Recent Labs Lab 04/06/13 0518 04/07/13 0500  AST 45* 43*  ALT 21 20  ALKPHOS 50 47  BILITOT 0.8 0.8  PROT 7.0 6.1  ALBUMIN 3.5 3.0*    Recent Labs Lab 04/06/13 0518 04/07/13 0500  LIPASE 70* 52   No results found for this basename: AMMONIA,  in the last 168 hours CBC:  Recent Labs Lab 04/06/13 0518 04/07/13 0500 04/08/13 0600 04/09/13 0538  WBC 10.4 7.7 5.5 5.5  NEUTROABS 7.1  --   --   --   HGB 12.6* 11.6* 11.2* 11.0*  HCT 36.4* 33.3* 32.6* 32.2*  MCV 89.9 89.5 88.6 89.0  PLT 158 120* 109* 103*   Cardiac Enzymes:  Recent Labs Lab 04/06/13 0518  TROPONINI <0.30   BNP: BNP (last 3 results)  Recent Labs   04/06/13 0518 04/07/13 0500  PROBNP 73.5 66.0   CBG:  Recent Labs Lab 04/07/13 2050 04/08/13 0730 04/08/13 1119 04/08/13 1707 04/08/13 2106  GLUCAP 154* 114* 152* 137* 130*       Signed:  Ellyssa Zagal  [facility] 04/09/2013, 7:43 AM

## 2013-04-10 LAB — BODY FLUID CULTURE: Gram Stain: NONE SEEN

## 2013-04-25 ENCOUNTER — Other Ambulatory Visit: Payer: Medicare Other

## 2013-04-25 ENCOUNTER — Other Ambulatory Visit: Payer: Self-pay | Admitting: Family Medicine

## 2013-04-25 DIAGNOSIS — R188 Other ascites: Secondary | ICD-10-CM

## 2013-04-25 DIAGNOSIS — K7460 Unspecified cirrhosis of liver: Secondary | ICD-10-CM

## 2013-04-28 ENCOUNTER — Other Ambulatory Visit (HOSPITAL_COMMUNITY): Payer: Self-pay | Admitting: Family Medicine

## 2013-04-28 ENCOUNTER — Ambulatory Visit (HOSPITAL_COMMUNITY)
Admission: RE | Admit: 2013-04-28 | Discharge: 2013-04-28 | Disposition: A | Discharge status: Home / Self Care | Payer: Medicare Other | Source: Ambulatory Visit | Attending: Family Medicine | Admitting: Family Medicine

## 2013-04-28 DIAGNOSIS — R188 Other ascites: Secondary | ICD-10-CM | POA: Insufficient documentation

## 2013-04-28 DIAGNOSIS — K7460 Unspecified cirrhosis of liver: Secondary | ICD-10-CM | POA: Insufficient documentation

## 2013-04-28 NOTE — Procedures (Signed)
Successful US guided paracentesis from RLQ.  Yielded 7.5 liters of cloudy/milky fluid.  No immediate complications.  Pt tolerated well.   Specimen was not sent for labs.  Pattricia Boss D PA-C 04/28/2013 1:01 PM

## 2013-05-08 ENCOUNTER — Ambulatory Visit
Admission: RE | Admit: 2013-05-08 | Discharge: 2013-05-08 | Disposition: A | Discharge status: Home / Self Care | Source: Ambulatory Visit | Attending: Gastroenterology | Admitting: Gastroenterology

## 2013-05-08 ENCOUNTER — Other Ambulatory Visit: Payer: Self-pay | Admitting: Gastroenterology

## 2013-05-08 DIAGNOSIS — R1310 Dysphagia, unspecified: Secondary | ICD-10-CM

## 2013-05-13 ENCOUNTER — Encounter (HOSPITAL_COMMUNITY): Payer: Self-pay | Admitting: Emergency Medicine

## 2013-05-13 ENCOUNTER — Inpatient Hospital Stay (HOSPITAL_COMMUNITY)
Admission: EM | Admit: 2013-05-13 | Discharge: 2013-05-17 | DRG: 433 | Disposition: A | Discharge status: Home / Self Care | Payer: Medicare Other | Attending: Internal Medicine | Admitting: Internal Medicine

## 2013-05-13 DIAGNOSIS — E860 Dehydration: Secondary | ICD-10-CM

## 2013-05-13 DIAGNOSIS — J9600 Acute respiratory failure, unspecified whether with hypoxia or hypercapnia: Secondary | ICD-10-CM

## 2013-05-13 DIAGNOSIS — K760 Fatty (change of) liver, not elsewhere classified: Secondary | ICD-10-CM

## 2013-05-13 DIAGNOSIS — Z833 Family history of diabetes mellitus: Secondary | ICD-10-CM

## 2013-05-13 DIAGNOSIS — K219 Gastro-esophageal reflux disease without esophagitis: Secondary | ICD-10-CM | POA: Diagnosis present

## 2013-05-13 DIAGNOSIS — K7460 Unspecified cirrhosis of liver: Principal | ICD-10-CM | POA: Diagnosis present

## 2013-05-13 DIAGNOSIS — R748 Abnormal levels of other serum enzymes: Secondary | ICD-10-CM

## 2013-05-13 DIAGNOSIS — E871 Hypo-osmolality and hyponatremia: Secondary | ICD-10-CM

## 2013-05-13 DIAGNOSIS — E236 Other disorders of pituitary gland: Secondary | ICD-10-CM | POA: Diagnosis present

## 2013-05-13 DIAGNOSIS — E119 Type 2 diabetes mellitus without complications: Secondary | ICD-10-CM | POA: Diagnosis present

## 2013-05-13 DIAGNOSIS — K7689 Other specified diseases of liver: Secondary | ICD-10-CM | POA: Diagnosis present

## 2013-05-13 DIAGNOSIS — R188 Other ascites: Secondary | ICD-10-CM | POA: Diagnosis present

## 2013-05-13 DIAGNOSIS — R339 Retention of urine, unspecified: Secondary | ICD-10-CM | POA: Diagnosis present

## 2013-05-13 DIAGNOSIS — N400 Enlarged prostate without lower urinary tract symptoms: Secondary | ICD-10-CM | POA: Diagnosis present

## 2013-05-13 DIAGNOSIS — Z87891 Personal history of nicotine dependence: Secondary | ICD-10-CM

## 2013-05-13 DIAGNOSIS — R0602 Shortness of breath: Secondary | ICD-10-CM | POA: Diagnosis present

## 2013-05-13 DIAGNOSIS — N189 Chronic kidney disease, unspecified: Secondary | ICD-10-CM | POA: Diagnosis present

## 2013-05-13 DIAGNOSIS — N179 Acute kidney failure, unspecified: Secondary | ICD-10-CM

## 2013-05-13 DIAGNOSIS — I129 Hypertensive chronic kidney disease with stage 1 through stage 4 chronic kidney disease, or unspecified chronic kidney disease: Secondary | ICD-10-CM | POA: Diagnosis present

## 2013-05-13 HISTORY — DX: Unspecified cirrhosis of liver: K74.60

## 2013-05-13 LAB — URINALYSIS, ROUTINE W REFLEX MICROSCOPIC
Glucose, UA: NEGATIVE mg/dL
Hgb urine dipstick: NEGATIVE
Ketones, ur: NEGATIVE mg/dL
Nitrite: NEGATIVE
Protein, ur: NEGATIVE mg/dL
Specific Gravity, Urine: 1.026 (ref 1.005–1.030)
Urobilinogen, UA: 1 mg/dL (ref 0.0–1.0)
pH: 5.5 (ref 5.0–8.0)

## 2013-05-13 LAB — CBC WITH DIFFERENTIAL/PLATELET
Basophils Absolute: 0 10*3/uL (ref 0.0–0.1)
Basophils Relative: 0 % (ref 0–1)
Eosinophils Absolute: 0 10*3/uL (ref 0.0–0.7)
Eosinophils Relative: 0 % (ref 0–5)
HCT: 39.2 % (ref 39.0–52.0)
Hemoglobin: 14 g/dL (ref 13.0–17.0)
Lymphocytes Relative: 10 % — ABNORMAL LOW (ref 12–46)
Lymphs Abs: 1.2 10*3/uL (ref 0.7–4.0)
MCH: 31.8 pg (ref 26.0–34.0)
MCHC: 35.7 g/dL (ref 30.0–36.0)
MCV: 89.1 fL (ref 78.0–100.0)
Monocytes Absolute: 1.4 10*3/uL — ABNORMAL HIGH (ref 0.1–1.0)
Monocytes Relative: 11 % (ref 3–12)
Neutro Abs: 9.9 10*3/uL — ABNORMAL HIGH (ref 1.7–7.7)
Neutrophils Relative %: 79 % — ABNORMAL HIGH (ref 43–77)
Platelets: 188 10*3/uL (ref 150–400)
RBC: 4.4 MIL/uL (ref 4.22–5.81)
RDW: 15.1 % (ref 11.5–15.5)
WBC: 12.5 10*3/uL — ABNORMAL HIGH (ref 4.0–10.5)

## 2013-05-13 LAB — COMPREHENSIVE METABOLIC PANEL
ALT: 27 U/L (ref 0–53)
AST: 37 U/L (ref 0–37)
Albumin: 2.7 g/dL — ABNORMAL LOW (ref 3.5–5.2)
Alkaline Phosphatase: 107 U/L (ref 39–117)
BUN: 50 mg/dL — ABNORMAL HIGH (ref 6–23)
CO2: 23 mEq/L (ref 19–32)
Calcium: 9.3 mg/dL (ref 8.4–10.5)
Chloride: 80 mEq/L — ABNORMAL LOW (ref 96–112)
Creatinine, Ser: 1.95 mg/dL — ABNORMAL HIGH (ref 0.50–1.35)
GFR calc Af Amer: 37 mL/min — ABNORMAL LOW (ref >90)
GFR calc non Af Amer: 32 mL/min — ABNORMAL LOW (ref >90)
Glucose, Bld: 159 mg/dL — ABNORMAL HIGH (ref 70–99)
Potassium: 4.7 mEq/L (ref 3.5–5.1)
Sodium: 120 mEq/L — ABNORMAL LOW (ref 135–145)
Total Bilirubin: 1.2 mg/dL (ref 0.3–1.2)
Total Protein: 6.7 g/dL (ref 6.0–8.3)

## 2013-05-13 LAB — URINE MICROSCOPIC-ADD ON

## 2013-05-13 LAB — PROTIME-INR
INR: 1.33 (ref 0.00–1.49)
Prothrombin Time: 16.2 seconds — ABNORMAL HIGH (ref 11.6–15.2)

## 2013-05-13 MED ORDER — TAMSULOSIN HCL 0.4 MG PO CAPS: 0.4000 mg | ORAL_CAPSULE | Freq: Every day | ORAL | Status: DC

## 2013-05-13 MED ORDER — SODIUM CHLORIDE 0.9 % IV SOLN: INTRAVENOUS | Status: DC

## 2013-05-13 NOTE — ED Provider Notes (Signed)
CSN: [id number]     Arrival date & time 05/13/13  1954 History   First MD Initiated Contact with Patient 05/13/13 1957     Chief Complaint  Patient presents with  . Fatigue    abnormal labs   (Consider location/radiation/quality/duration/timing/severity/associated sxs/prior Treatment) HPI  72 year old male referred from and outpatient providers for further evaluation because of abnormal labs. Wife reports worsening hyponatremia and renal function, but not sure of specifics. 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 Started on aldactone and lasix. Wife reports recent labs showed worsening renal function and pt reports decreased urinary outpt. Diuretics stopped and urination briefly increased, but slowing again. Not anuric. Repeat blood work showed even further worsening and referred to ED. Pt reporting feeling very fatigued over the past 3-4 days. Anorexia. Staying well below fluid restriction of reported 48 oz despite trying to make himself drink. Despite this still gaining weight. Cr 1.16 on discharge 10/22. Abdominal distension worsening. Dyspnea "depending on the way I'm positioned."    Past Medical History  Diagnosis Date  . Diabetes mellitus without complication   . Hypertension   . 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 Back surgery    . Hernia repair     History reviewed. No pertinent family history. History  Substance Use Topics  . Smoking status: Former Games developer  . Smokeless tobacco: Not on file  . Alcohol Use: No    Review of Systems  All systems reviewed and negative, other than as noted in HPI.   Allergies  Review of patient's allergies indicates no known allergies.  Home Medications   Current Outpatient Rx  Name  Route  Sig  Dispense  Refill  . esomeprazole (NEXIUM) 20 MG  capsule   Oral   Take 20 mg by mouth every morning.         . furosemide (LASIX) 40 MG tablet   Oral   Take 1 tablet (40 mg total) by mouth daily.   30 tablet   1   . glyBURIDE micronized (GLYNASE) 3 MG tablet   Oral   Take 3 mg by mouth daily with breakfast.          . metFORMIN (GLUCOPHAGE-XR) 500 MG 24 hr tablet   Oral   Take 500 mg by mouth daily with breakfast.          . omeprazole-sodium bicarbonate (ZEGERID) 40-1100 MG per capsule   Oral   Take 1 capsule by mouth every evening.          Marland Kitchen spironolactone (ALDACTONE) 25 MG tablet   Oral   Take 2 tablets (50 mg total) by mouth daily.   60 tablet   1    BP 132/72  Pulse 95  Temp(Src) 97.6 F (36.4 C) (Oral)  Resp 18  SpO2 96% Physical Exam  Nursing note and vitals reviewed. Constitutional: He appears well-developed.  Laying in bed. Tired appearing.   HENT:  Head: Normocephalic and atraumatic.  Eyes: Conjunctivae are normal. Right eye exhibits no discharge. Left eye exhibits no discharge.  Neck: Neck supple.  Cardiovascular: Normal rate, regular rhythm and normal heart sounds.  Exam reveals no gallop and no friction rub.   No murmur heard. Pulmonary/Chest: Effort normal and breath sounds normal. No respiratory distress.  Abdominal: Soft. He exhibits distension. There is no  tenderness.  Abdominal distension. Soft. No tenderness.   Musculoskeletal: He exhibits no edema and no tenderness.  Neurological: He is alert.  Skin: Skin is warm and dry.  Psychiatric: He has a normal mood and affect. His behavior is normal. Thought content normal.    ED Course  Procedures (including critical care time) Labs Review Labs Reviewed  COMPREHENSIVE METABOLIC PANEL - Abnormal; Notable for the following:    Sodium 120 (*)    Chloride 80 (*)    Glucose, Bld 159 (*)    BUN 50 (*)    Creatinine, Ser 1.95 (*)    Albumin 2.7 (*)    GFR calc non Af Amer 32 (*)    GFR calc Af Amer 37 (*)    All other components  within normal limits  PROTIME-INR - Abnormal; Notable for the following:    Prothrombin Time 16.2 (*)    All other components within normal limits  CBC WITH DIFFERENTIAL - Abnormal; Notable for the following:    WBC 12.5 (*)    Neutrophils Relative % 79 (*)    Neutro Abs 9.9 (*)    Lymphocytes Relative 10 (*)    Monocytes Absolute 1.4 (*)    All other components within normal limits  URINALYSIS, ROUTINE W REFLEX MICROSCOPIC   Imaging Review US Renal  05/14/2013   CLINICAL DATA:  History of acute on chronic renal failure. History of hepatic cirrhosis.  EXAM: RENAL/URINARY TRACT ULTRASOUND COMPLETE  COMPARISON:  CT 04/06/2013.  FINDINGS: Right Kidney  Length: Right renal length is 11.1 cm. There is slight increased echogenicity of the parenchyma with decrease in the differentiation between cortex and medulla consistent with medical renal disease. No mass, calculus, parenchymal loss, or hydronephrosis visualized.  Left Kidney  Length: Left renal length is 11.2 cm. There is slight increased echogenicity of the parenchyma with decrease in the differentiation between cortex and medulla consistent with medical renal disease. No mass, calculus, parenchymal loss, or hydronephrosis visualized.  Bladder  No urinary bladder abnormality is evident. There is indentation of the bladder base by prostate. Prostate measures 5.4 x 5.1 x 3.7 cm.  There is ascites.  IMPRESSION: Kidneys are normal size with no evidence of parenchymal loss. No hydronephrosis is seen.  There is slight increased echogenicity of the parenchyma with decrease in the differentiation between cortex and medulla consistent with medical renal disease.  No intrinsic abnormality of the urinary bladder is seen. There is indentation of the bladder base by prostate gland. Prostate measures 5.4 x 5.1 x 3.7 cm.  There is ascites.   Electronically Signed   By: [name]  Call M.D.   On: 05/14/2013 15:40   US Paracentesis  05/14/2013   CLINICAL DATA:   Abdominal ascites  EXAM: ULTRASOUND GUIDED RIGHT LOWER QUADRANT PARACENTESIS  COMPARISON:  Previous paracentesis  PROCEDURE: An ultrasound guided paracentesis was thoroughly discussed with the patient and questions answered. The benefits, risks, alternatives and complications were also discussed. The patient understands and wishes to proceed with the procedure. Written consent was obtained.  Ultrasound was performed to localize and mark an adequate pocket of fluid in the right lower quadrant of the abdomen. The area was then prepped and draped in the normal sterile fashion. 1% Lidocaine was used for local anesthesia. Under ultrasound guidance a 19 gauge Yeuh catheter was introduced. Paracentesis was performed. The catheter was removed and a dressing applied.  Complications: none  FINDINGS: A total of approximately 8.4 liters of milky brown fluid was removed. A  fluid sample was not sent for laboratory analysis.  IMPRESSION: Successful ultrasound guided paracentesis yielding 8.4 liters of ascites.  IV albumin administered prior to procedure per MD.  BP 109/52 post Procedure the  Read by: Beckey Downing PA-C   Electronically Signed   By: Oley Balm M.D.   On: 05/14/2013 15:01    EKG Interpretation   None       MDM   1. Acute on chronic renal failure   2. Hyponatremia   3. Dehydration    72 year old male with acute on chronic renal failure and hyponatremia. Gentle hydration. Admission.   Raeford Razor, MD 05/16/13 [phone]

## 2013-05-13 NOTE — ED Notes (Signed)
Pt states he is here for abnormal lab values,  His physician called and said he needed to be admitted for abnormal kidney enzymes and sodium

## 2013-05-13 NOTE — ED Notes (Signed)
Secretary called again, stated there are still no channels available at this time.

## 2013-05-13 NOTE — ED Notes (Signed)
Waiting for channels at this time to administer fluids, secretary paged at this time for these

## 2013-05-13 NOTE — ED Notes (Signed)
RN called for report, states this is not a telemetry bed. Secretary made aware and will call bed placement at this time.

## 2013-05-13 NOTE — H&P (Signed)
[facility] History and Physical  Jerry [name] ZOX:[id number] DOB: 01-Apr-1941 DOA: 05/13/2013  Referring physician: Dr Juleen China PCP: Blair Heys, MD  Chief Complaint:  called by GI for low Na and AKI   HPI:  72 year old male with history of newly diagnosed nonalcoholic liver disease, diabetes mellitus, hypertension  was hospitalized one month back with acute respiratory dysphagia in the setting of abdominal ascites requiring large-volume paracenteses. He was then started on Lasix and Aldactone upon discharge. During that admission he underwent 2 rounds of clot volume paracenteses. He then followed up with he is GI doctor Buccini and had another large fluid and paracenteses about 2 weeks back with 7.5 L of fluid removed. Patient has been on a salt restricted diet at home and continued Lasix and Aldactone. He followed up with Dr. Diego Cory last week and was asked to stop the Lasix and Aldactone for a few days and resume it from yesterday as his renal function had declined. He saw his PCP today for blood work and was found to have worsened hyponatremia and renal function. Dr Matthias Hughs reviewed the lab work and ask the patient to come to the ED. Patient reports that he has noticed increased abdominal girth for possible 1 week. Also feels very tired and has some dyspnea on exertion. He denies any fever, chills, headache, dizziness, blurred vision, tinnitus, jaundice, nausea, vomiting, chest pain, palpitations, orthopnea, PND, abdominal pain, bowel or urinary symptoms.( He does report that he was making less urine while on Lasix). He denies any hematemesis or melena. He denies any confusion or increased somnolence.  Review of Systems:  Constitutional: Denies fever, chills, diaphoresis, appetite change and fatigue.  HEENT: Denies photophobia,,  ear pain, congestion, sore throat, rhinorrhea, sneezing, mouth sores, trouble swallowing, neck pain, neck stiffness and tinnitus.   Respiratory: Dyspnea on  exertion, cough, chest tightness,  and wheezing.   Cardiovascular: Denies chest pain, palpitations and leg swelling.  Gastrointestinal: Abdominal distention, Denies nausea, vomiting, abdominal pain, diarrhea, constipation, blood in stool  Genitourinary: Denies dysuria, urgency, frequency, hematuria, flank pain , difficulty urinating.  Endocrine: Denies polyuria polydipsia. Musculoskeletal: Denies myalgias, back pain, joint swelling, arthralgias and gait problem.  Skin: Denies pallor, rash and wound.  Neurological: Denies dizziness, seizures, syncope, weakness, light-headedness, numbness and headaches.  Hematological: Denies adenopathy. Easy bruising Psychiatric/Behavioral: Denies  mood changes, confusion,   Past Medical History  Diagnosis Date  . Diabetes mellitus without complication   . Hypertension   . 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 Back surgery    . Hernia repair     Social History:  reports that he has quit smoking. He does not have any smokeless tobacco history on file. He reports that he does not drink alcohol or use illicit drugs.  No Known Allergies  History reviewed. No pertinent family history.  Prior to Admission medications   Medication Sig Start Date End Date Taking? Authorizing Provider  esomeprazole (NEXIUM) 20 MG capsule Take 20 mg by mouth every morning.   Yes Historical Provider, MD  furosemide (LASIX) 40 MG tablet Take 1 tablet (40 mg total) by mouth daily. 04/09/13  Yes Costin Otelia Sergeant, MD  glyBURIDE micronized (GLYNASE) 3 MG tablet Take 3 mg by mouth daily with breakfast.  03/26/13  Yes Historical Provider, MD  metFORMIN (GLUCOPHAGE-XR) 500 MG 24 hr tablet Take 500 mg by mouth daily with breakfast.  02/07/13  Yes Historical Provider, MD  omeprazole-sodium  bicarbonate (ZEGERID) 40-1100 MG per capsule Take 1 capsule by mouth every evening.  04/01/13  Yes Historical Provider, MD   spironolactone (ALDACTONE) 25 MG tablet Take 2 tablets (50 mg total) by mouth daily. 04/09/13  Yes Costin Otelia Sergeant, MD    Physical Exam:  Filed Vitals:   05/13/13 2003 05/13/13 2038 05/13/13 2245  BP: 132/72  110/68  Pulse: 95    Temp: 97.6 F (36.4 C)    TempSrc: Oral    Resp: 18  17  SpO2: 95% 96% 94%    Constitutional: Vital signs reviewed.  Patient is an elderly male lying in bed in no acute distress HEENT: No pallor, no icterus, moist oral mucosa, Cardiovascular: RRR, S1 normal, S2 normal, no MRG, Pulmonary/Chest: CTAB, no wheezes, rales, or rhonchi Abdominal: Soft, very distended with dullness to percussion, nontender bowel sounds difficult to elicit given abdominal distention, no skin changes of chronic liver disease  extremity: Warm, Trace edema Neurological: A&O x3, nonfocal, no tremors  Labs on Admission:  Basic Metabolic Panel:  Recent Labs Lab 05/13/13 2055  NA 120*  K 4.7  CL 80*  CO2 23  GLUCOSE 159*  BUN 50*  CREATININE 1.95*  CALCIUM 9.3   Liver Function Tests:  Recent Labs Lab 05/13/13 2055  AST 37  ALT 27  ALKPHOS 107  BILITOT 1.2  PROT 6.7  ALBUMIN 2.7*   No results found for this basename: LIPASE, AMYLASE,  in the last 168 hours No results found for this basename: AMMONIA,  in the last 168 hours CBC:  Recent Labs Lab 05/13/13 2055  WBC 12.5*  NEUTROABS 9.9*  HGB 14.0  HCT 39.2  MCV 89.1  PLT 188   Cardiac Enzymes: No results found for this basename: CKTOTAL, CKMB, CKMBINDEX, TROPONINI,  in the last 168 hours BNP: No components found with this basename: POCBNP,  CBG: No results found for this basename: GLUCAP,  in the last 168 hours  Radiological Exams on Admission: No results found.  EKG: Normal sinus rhythm at 94 with PVCs, prolonged QTC of 519.  Assessment/Plan Principal Problem:   Hyponatremia Multifactorial in the setting of cirrhosis of liver with severe ascites and being on diuretic. Low likely hood of HRS.  Patient reports having good UOP. He however des have about 600 cc on bladder scan. Check urine osm and urine Na. -Discontinue Lasix and Aldactone. I would place him on gentle hydration. -Monitor strict I/O. urine output continues to be low with urine retention on bladder scan will place  Foley. Monitor on telemetry. -Strict fluid restriction to less than 1L    Active Problems: Cirrhosis of the liver with ascites Patient will need large volume paracenteses which has been requested for IR . INR is normal. -Ordered IV albumin (50 g ) at paracenteses -[facility] will be consulted in am    Acute on chronic renal failure likely in the setting of diuretic and ARB along with poor renal perfusion due to ascites. Monitor renal fn closely. He does have some urinary retention on bladder scan as well. Has hx of enlarged prostate. i will add flomax.     Shortness of breath Likely in the setting of massive ascites and to improve with paracenteses     NAFLD (nonalcoholic fatty liver disease) As outlined above. Monitor LFTs.    Diabetes mellitus, type II will hold metformin indefinitely. Hold glipizide for now. We'll place on sliding scale insulin   DVT prophylaxis: Subcutaneous heparin Diet: Diabetic, n.p.o. after midnight for  paracenteses   Code Status:full code  Family Communication:  wife and daughter at bedside Disposition Plan:  home once improved   Eddie North [facility] Pager [phone]  If 7PM-7AM, please contact night-coverage [email or web address] Password [facility] 05/13/2013, 10:46 PM   total time spent on admission: 70 minutes

## 2013-05-13 NOTE — ED Notes (Signed)
Phlebotomist aware unable to access blood with IV insertion. Will draw blood.

## 2013-05-14 ENCOUNTER — Encounter (HOSPITAL_COMMUNITY): Payer: Self-pay

## 2013-05-14 ENCOUNTER — Inpatient Hospital Stay (HOSPITAL_COMMUNITY): Payer: Medicare Other

## 2013-05-14 DIAGNOSIS — R188 Other ascites: Secondary | ICD-10-CM

## 2013-05-14 DIAGNOSIS — K7460 Unspecified cirrhosis of liver: Principal | ICD-10-CM

## 2013-05-14 DIAGNOSIS — E860 Dehydration: Secondary | ICD-10-CM

## 2013-05-14 DIAGNOSIS — N189 Chronic kidney disease, unspecified: Secondary | ICD-10-CM

## 2013-05-14 DIAGNOSIS — N179 Acute kidney failure, unspecified: Secondary | ICD-10-CM

## 2013-05-14 LAB — BASIC METABOLIC PANEL
BUN: 51 mg/dL — ABNORMAL HIGH (ref 6–23)
Calcium: 8.7 mg/dL (ref 8.4–10.5)
Chloride: 82 mEq/L — ABNORMAL LOW (ref 96–112)
Creatinine, Ser: 1.82 mg/dL — ABNORMAL HIGH (ref 0.50–1.35)
GFR calc Af Amer: 40 mL/min — ABNORMAL LOW (ref >90)
GFR calc non Af Amer: 35 mL/min — ABNORMAL LOW (ref >90)
Glucose, Bld: 219 mg/dL — ABNORMAL HIGH (ref 70–99)
Potassium: 4.4 mEq/L (ref 3.5–5.1)

## 2013-05-14 LAB — GLUCOSE, CAPILLARY
Glucose-Capillary: 110 mg/dL — ABNORMAL HIGH (ref 70–99)
Glucose-Capillary: 169 mg/dL — ABNORMAL HIGH (ref 70–99)
Glucose-Capillary: 201 mg/dL — ABNORMAL HIGH (ref 70–99)

## 2013-05-14 LAB — CBC
Hemoglobin: 12.8 g/dL — ABNORMAL LOW (ref 13.0–17.0)
MCH: 31.7 pg (ref 26.0–34.0)
MCHC: 35.7 g/dL (ref 30.0–36.0)
Platelets: 175 10*3/uL (ref 150–400)
RDW: 15.2 % (ref 11.5–15.5)

## 2013-05-14 LAB — OSMOLALITY: Osmolality: 272 mOsm/kg — ABNORMAL LOW (ref 275–300)

## 2013-05-14 LAB — SODIUM, URINE, RANDOM: Sodium, Ur: <10 mEq/L

## 2013-05-14 MED ORDER — HEPARIN SODIUM (PORCINE) 5000 UNIT/ML IJ SOLN
5000.0000 [IU] | Freq: Three times a day (TID) | INTRAMUSCULAR | Status: DC
  Administered 2013-05-14 – 2013-05-17 (×10): 5000 [IU] via SUBCUTANEOUS
  Filled 2013-05-14 (×13): qty 1

## 2013-05-14 MED ORDER — ALBUMIN HUMAN 25 % IV SOLN
50.0000 g | Freq: Once | INTRAVENOUS | Status: AC
  Administered 2013-05-14: 50 g via INTRAVENOUS
  Filled 2013-05-14 (×2): qty 200

## 2013-05-14 MED ORDER — ONDANSETRON HCL 4 MG/2ML IJ SOLN: 4.0000 mg | Freq: Four times a day (QID) | INTRAMUSCULAR | Status: DC | PRN

## 2013-05-14 MED ORDER — INSULIN ASPART 100 UNIT/ML ~~LOC~~ SOLN
0.0000 [IU] | Freq: Three times a day (TID) | SUBCUTANEOUS | Status: DC
  Administered 2013-05-14 – 2013-05-15 (×2): 3 [IU] via SUBCUTANEOUS
  Administered 2013-05-15: 2 [IU] via SUBCUTANEOUS
  Administered 2013-05-16 (×2): 3 [IU] via SUBCUTANEOUS
  Administered 2013-05-17: 09:00:00 2 [IU] via SUBCUTANEOUS

## 2013-05-14 MED ORDER — TAMSULOSIN HCL 0.4 MG PO CAPS
0.4000 mg | ORAL_CAPSULE | Freq: Every day | ORAL | Status: DC
  Administered 2013-05-14 – 2013-05-17 (×4): 0.4 mg via ORAL
  Filled 2013-05-14 (×4): qty 1

## 2013-05-14 MED ORDER — ONDANSETRON HCL 4 MG PO TABS: 4.0000 mg | ORAL_TABLET | Freq: Four times a day (QID) | ORAL | Status: DC | PRN

## 2013-05-14 MED ORDER — ALBUMIN HUMAN 5 % IV SOLN
25.0000 g | Freq: Once | INTRAVENOUS | Status: AC
  Administered 2013-05-14: 17:00:00 25 g via INTRAVENOUS
  Filled 2013-05-14: qty 500

## 2013-05-14 MED ORDER — PANTOPRAZOLE SODIUM 40 MG PO TBEC
40.0000 mg | DELAYED_RELEASE_TABLET | Freq: Every day | ORAL | Status: DC
  Administered 2013-05-14 – 2013-05-17 (×4): 40 mg via ORAL
  Filled 2013-05-14 (×4): qty 1

## 2013-05-14 MED ORDER — SODIUM CHLORIDE 0.9 % IV SOLN
INTRAVENOUS | Status: DC
  Administered 2013-05-14 – 2013-05-15 (×3): via INTRAVENOUS

## 2013-05-14 MED ORDER — ALBUMIN HUMAN 25 % IV SOLN
25.0000 g | Freq: Four times a day (QID) | INTRAVENOUS | Status: AC
  Administered 2013-05-15 (×4): 25 g via INTRAVENOUS
  Filled 2013-05-14 (×5): qty 100

## 2013-05-14 NOTE — Progress Notes (Signed)
Called to get report from ED. I was put on hold for 3 min. Will call back in 5 min.

## 2013-05-14 NOTE — Progress Notes (Addendum)
[facility] PROGRESS NOTE  Jerry [name] ZOX:[id number] DOB: March 12, 1941 DOA: 05/13/2013 PCP: Jerry Lance, MD  Assessment/Plan: Hyponatremia -multifactorial including his liver disease, volume depletion, and medications (Aldactone), ?SIADH -check uric acid -urine osm, serum osm -urine Na and urine Creatinine -d/c lasix and aldactone -may need renal evaluation if no improvement -diuretics restarted 11/24 for one day after holding during 11/22 and 11/23 -continue gentle hydration with NS Liver cirrhosis (NASH) with ascites -repeat paracentesis -lower clinical suspicion of SBP -not sure he will be able to tolerate lasix and aldactone long term Acute on chronic renal Failure -due to volume depletion and 3rd spacing of fluid -doubt hepatorenal syndrome -albumin after paracentesis -renal US -albumin 25 grams q6 hrs x 4  Dyspnea -due to massive ascites compressing diaphragm Diabetes mellitus type 2-controlled -HbA1C= 6.9 on 04/07/13 -will not plan to restart metformin due to wide fluctuations in renal function and renal insufficiency -Novolog sliding scale while inpt -CBGs controlled HTN -d/c diuretics and ARB -BP is soft but stable  Family Communication:   Wife and daughter at beside Disposition Plan:   home when medically stable      Procedures/Studies: Dg Esophagus  05/08/2013   CLINICAL DATA:  Dysphagia  EXAM: ESOPHOGRAM/BARIUM SWALLOW  TECHNIQUE: Single contrast examination was performed using  thin barium.  COMPARISON:  CT abdomen pelvis of 04/06/2013  FLUOROSCOPY TIME:  2 min 12 seconds  FINDINGS: In view of the clinical question of aspiration, this study was begun on the lateral projection with a rapid sequence spot films performed. The swallowing mechanism is unremarkable. No aspiration is demonstrated. There are mild to moderate tertiary contractions in the mid and distal esophagus. Within the distal esophagus there is an indentation laterally with  irregular margins. 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 A distal esophageal mucosal lesion cannot be excluded. No reflux is seen. A barium pill was given at the end of the study which passed into the stomach without delay.  IMPRESSION: 1. No aspiration. 2. 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 An intraluminal distal esophageal lesion cannot be excluded. 3. Mild to moderate tertiary contractions in the distal esophagus.   Electronically Signed   By: Jerry [name] M.D.   On: 05/08/2013 15:45   US Paracentesis  04/28/2013   CLINICAL DATA:  Cirrhosis, recurrent ascites  EXAM: ULTRASOUND GUIDED PARACENTESIS  TECHNIQUE: Survey ultrasound of the abdomen was performed and an appropriate skin entry site in the RLQ abdomen was selected. Skin site was marked, prepped with Betadine, and draped in usual sterile fashion, and infiltrated locally with 1% lidocaine. A 5 French multisidehole Yueh sheath needle was advanced into the peritoneal space until fluid could be aspirated. The sheath was advanced and the needle removed. 7.5 liters of cloudy/milkyascites were aspirated. No immediate complication.  IMPRESSION: Technically successful ultrasound guided paracentesis, removing 7.5 liters of ascites.  Read By:  Jerry Boss PA-C   Electronically Signed   By: Jerry [name] M.D.   On: 04/28/2013 13:04         Subjective: Pt c/o some dyspnea on exertion and abdominal distension with increased girth.  Denies f/c, cp, n/v/d, dysuria, HA, dizziness  Objective: Filed Vitals:   05/14/13 0533 05/14/13 1154 05/14/13 1236 05/14/13 1300  BP: 104/70 88/58 99/53  104/60  Pulse: 90 82 88 87  Temp: 98 F (36.7 C) 98 F (36.7 C)  TempSrc: Oral Oral    Resp: 18     Height:      Weight:      SpO2: 96% 92%      Intake/Output Summary (Last 24  hours) at 05/14/13 1330 Last data filed at 05/14/13 0935  Gross per 24 hour  Intake 725.75 ml  Output    345 ml  Net 380.75 ml   Weight change:  Exam:   General:  Pt is alert, follows commands appropriately, not in acute distress  HEENT: No icterus, No thrush,  [place]/AT  Cardiovascular: RRR, S1/S2, no rubs, no gallops  Respiratory: fine bibasilar crackles without wheeze.  Good air movement  Abdomen: Soft/+BS, non tender, mildly distended with fluid wave, no guarding  Extremities: No edema, No lymphangitis, No petechiae, No rashes, no synovitis  Data Reviewed: Basic Metabolic Panel:  Recent Labs Lab 05/13/13 2055 05/14/13 0355  NA 120* 120*  K 4.7 4.4  CL 80* 82*  CO2 23 22  GLUCOSE 159* 219*  BUN 50* 51*  CREATININE 1.95* 1.82*  CALCIUM 9.3 8.7   Liver Function Tests:  Recent Labs Lab 05/13/13 2055  AST 37  ALT 27  ALKPHOS 107  BILITOT 1.2  PROT 6.7  ALBUMIN 2.7*   No results found for this basename: LIPASE, AMYLASE,  in the last 168 hours No results found for this basename: AMMONIA,  in the last 168 hours CBC:  Recent Labs Lab 05/13/13 2055 05/14/13 0355  WBC 12.5* 9.8  NEUTROABS 9.9*  --   HGB 14.0 12.8*  HCT 39.2 35.9*  MCV 89.1 88.9  PLT 188 175   Cardiac Enzymes: No results found for this basename: CKTOTAL, CKMB, CKMBINDEX, TROPONINI,  in the last 168 hours BNP: No components found with this basename: POCBNP,  CBG:  Recent Labs Lab 05/14/13 0850 05/14/13 1223  GLUCAP 110* 169*    No results found for this or any previous visit (from the past 240 hour(s)).   Scheduled Meds: . heparin  5,000 Units Subcutaneous Q8H  . insulin aspart  0-15 Units Subcutaneous TID WC  . pantoprazole  40 mg Oral Daily  . tamsulosin  0.4 mg Oral Daily   Continuous Infusions: . sodium chloride 75 mL/hr at 05/14/13 0130     Jerry Bloodgood, DO  [facility] Pager [phone]  If 7PM-7AM, please contact night-coverage [email or web address] Password  [email or web address] 05/14/2013, 1:30 PM   LOS: 1 day

## 2013-05-14 NOTE — Consult Note (Signed)
[facility] CONSULT Reason for consult: Ascites Referring Physician: [facility]. PCP: Dr. Manus Gunning. Primary G.I.: Dr. Loura Halt [name] is an 72 y.o. male.  HPI: he has recently been diagnosed with cirrhosis. 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 He has recently developed societies and has required 2 prior paracentesis. CT scan has shown a small module or liver and sounds of portal hypertension. The patient was apparently tried on diuretics and there has been difficulty with azotemia restricting the use of diuretics resulting in real accumulation of ascites. The patient saw Dr. Matthias Hughs last week and due to azotemia this diuretics are held. He unfortunately developed increasing abdominal girth and shortness of breath. He is not had any fever. He is readmitted paracentesis and control of his azotemia. Interestingly, he stated his urine output has decreased while on diuretics and then increased after the diuretics were stopped. He does have a history of esophageal reflux and did have. Swallowing the past showing distal varices with no stricture  Past Medical History  Diagnosis Date  . Diabetes mellitus without complication   . Hypertension   . Cirrhosis, non-alcoholic     October 2014 diagnosed    Past Surgical History  Procedure Laterality Date  . Back surgery    . Hernia repair      Family History  Problem Relation Age of Onset  . Diabetes Mellitus I Mother   . Congestive Heart Failure Mother     Social History:  reports that he has quit smoking. He does not have any smokeless tobacco history on file. He reports that he does not drink alcohol or use illicit drugs.  Allergies: No Known Allergies  Medications; Prior to Admission medications   Medication Sig Start Date End Date Taking? Authorizing Provider  esomeprazole (NEXIUM) 20 MG capsule Take 20 mg by mouth every morning.   Yes Historical Provider, MD  furosemide (LASIX) 40 MG tablet Take 1 tablet (40 mg total) by  mouth daily. 04/09/13  Yes Costin Otelia Sergeant, MD  glyBURIDE micronized (GLYNASE) 3 MG tablet Take 3 mg by mouth daily with breakfast.  03/26/13  Yes Historical Provider, MD  metFORMIN (GLUCOPHAGE-XR) 500 MG 24 hr tablet Take 500 mg by mouth daily with breakfast.  02/07/13  Yes Historical Provider, MD  omeprazole-sodium bicarbonate (ZEGERID) 40-1100 MG per capsule Take 1 capsule by mouth every evening.  04/01/13  Yes Historical Provider, MD  spironolactone (ALDACTONE) 25 MG tablet Take 2 tablets (50 mg total) by mouth daily. 04/09/13  Yes Costin Otelia Sergeant, MD   . albumin human  50 g Intravenous Once  . heparin  5,000 Units Subcutaneous Q8H  . insulin aspart  0-15 Units Subcutaneous TID WC  . pantoprazole  40 mg Oral Daily  . tamsulosin  0.4 mg Oral Daily   PRN Meds ondansetron (ZOFRAN) IV, ondansetron Results for orders placed during the hospital encounter of 05/13/13 (from the past 48 hour(s))  COMPREHENSIVE METABOLIC PANEL     Status: Abnormal   Collection Time    05/13/13  8:55 PM      Result Value Range   Sodium 120 (*) 135 - 145 mEq/L   Potassium 4.7  3.5 - 5.1 mEq/L   Chloride 80 (*) 96 - 112 mEq/L   CO2 23  19 - 32 mEq/L   Glucose, Bld 159 (*) 70 - 99 mg/dL   BUN 50 (*) 6 - 23 mg/dL   Creatinine, Ser 1.61 (*) 0.50 - 1.35 mg/dL   Calcium 9.3  8.4 -  10.5 mg/dL   Total Protein 6.7  6.0 - 8.3 g/dL   Albumin 2.7 (*) 3.5 - 5.2 g/dL   AST 37  0 - 37 U/L   ALT 27  0 - 53 U/L   Alkaline Phosphatase 107  39 - 117 U/L   Total Bilirubin 1.2  0.3 - 1.2 mg/dL   GFR calc non Af Amer 32 (*) >90 mL/min   GFR calc Af Amer 37 (*) >90 mL/min   Comment: (NOTE)     The eGFR has been calculated using the CKD EPI equation.     This calculation has not been validated in all clinical situations.     eGFR's persistently <90 mL/min signify possible Chronic Kidney     Disease.  PROTIME-INR     Status: Abnormal   Collection Time    05/13/13  8:55 PM      Result Value Range   Prothrombin Time 16.2  (*) 11.6 - 15.2 seconds   INR 1.33  0.00 - 1.49  CBC WITH DIFFERENTIAL     Status: Abnormal   Collection Time    05/13/13  8:55 PM      Result Value Range   WBC 12.5 (*) 4.0 - 10.5 K/uL   RBC 4.40  4.22 - 5.81 MIL/uL   Hemoglobin 14.0  13.0 - 17.0 g/dL   HCT 78.2  95.6 - 21.3 %   MCV 89.1  78.0 - 100.0 fL   MCH 31.8  26.0 - 34.0 pg   MCHC 35.7  30.0 - 36.0 g/dL   RDW 08.6  57.8 - 46.9 %   Platelets 188  150 - 400 K/uL   Neutrophils Relative % 79 (*) 43 - 77 %   Neutro Abs 9.9 (*) 1.7 - 7.7 K/uL   Lymphocytes Relative 10 (*) 12 - 46 %   Lymphs Abs 1.2  0.7 - 4.0 K/uL   Monocytes Relative 11  3 - 12 %   Monocytes Absolute 1.4 (*) 0.1 - 1.0 K/uL   Eosinophils Relative 0  0 - 5 %   Eosinophils Absolute 0.0  0.0 - 0.7 K/uL   Basophils Relative 0  0 - 1 %   Basophils Absolute 0.0  0.0 - 0.1 K/uL  OSMOLALITY, URINE     Status: None   Collection Time    05/13/13 10:15 PM      Result Value Range   Osmolality, Ur 441  390 - 1090 mOsm/kg   Comment: Performed at [facility], ROUTINE W REFLEX MICROSCOPIC     Status: Abnormal   Collection Time    05/13/13 10:39 PM      Result Value Range   Color, Urine AMBER (*) YELLOW   Comment: BIOCHEMICALS MAY BE AFFECTED BY COLOR   APPearance CLEAR  CLEAR   Specific Gravity, Urine 1.026  1.005 - 1.030   pH 5.5  5.0 - 8.0   Glucose, UA NEGATIVE  NEGATIVE mg/dL   Hgb urine dipstick NEGATIVE  NEGATIVE   Bilirubin Urine SMALL (*) NEGATIVE   Ketones, ur NEGATIVE  NEGATIVE mg/dL   Protein, ur NEGATIVE  NEGATIVE mg/dL   Urobilinogen, UA 1.0  0.0 - 1.0 mg/dL   Nitrite NEGATIVE  NEGATIVE   Leukocytes, UA TRACE (*) NEGATIVE  SODIUM, URINE, RANDOM     Status: None   Collection Time    05/13/13 10:39 PM      Result Value Range   Sodium, Ur <10  Comment: Performed at [facility]  URINE MICROSCOPIC-ADD ON     Status: Abnormal   Collection Time    05/13/13 10:39 PM      Result Value Range   WBC, UA 0-2  <3 WBC/hpf    Casts HYALINE CASTS (*) NEGATIVE  BASIC METABOLIC PANEL     Status: Abnormal   Collection Time    05/14/13  3:55 AM      Result Value Range   Sodium 120 (*) 135 - 145 mEq/L   Potassium 4.4  3.5 - 5.1 mEq/L   Chloride 82 (*) 96 - 112 mEq/L   CO2 22  19 - 32 mEq/L   Glucose, Bld 219 (*) 70 - 99 mg/dL   BUN 51 (*) 6 - 23 mg/dL   Creatinine, Ser 4.54 (*) 0.50 - 1.35 mg/dL   Calcium 8.7  8.4 - 09.8 mg/dL   GFR calc non Af Amer 35 (*) >90 mL/min   GFR calc Af Amer 40 (*) >90 mL/min   Comment: (NOTE)     The eGFR has been calculated using the CKD EPI equation.     This calculation has not been validated in all clinical situations.     eGFR's persistently <90 mL/min signify possible Chronic Kidney     Disease.  CBC     Status: Abnormal   Collection Time    05/14/13  3:55 AM      Result Value Range   WBC 9.8  4.0 - 10.5 K/uL   RBC 4.04 (*) 4.22 - 5.81 MIL/uL   Hemoglobin 12.8 (*) 13.0 - 17.0 g/dL   HCT 11.9 (*) 14.7 - 82.9 %   MCV 88.9  78.0 - 100.0 fL   MCH 31.7  26.0 - 34.0 pg   MCHC 35.7  30.0 - 36.0 g/dL   RDW 56.2  13.0 - 86.5 %   Platelets 175  150 - 400 K/uL    No results found.             Blood pressure 104/70, pulse 90, temperature 98 F (36.7 C), temperature source Oral, resp. rate 18, height 5' 9.5" (1.765 m), weight 92.1 kg (203 lb 0.7 oz), SpO2 96.00%.  Physical exam:   General-- alert and oriented white male. No asterixsis, nonicteric Heart-- regular rate and rhythm without murmurs are gallops Lungs--clear Abdomen-- moderately distended with shifting dullness positive bowel sounds generally nontender   Assessment: 1. 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 He said multiple prior paracentesis but is having shortness of breath and needs this again.  Plan: 1. Agree with therapeutic paracentesis and agree with giving IV albumin. Will need to keep them here in the hospital until his electrolytes have been corrected. Unfortunately, it appears that he will need  chronic paracentesis since he does not appear to be able to tolerate diuretics. It would probably be worthwhile to have a trial of spironolactone for potassium sparing properties. He may well end up needing TIPS procedure in the future.     Sudiksha Victor JR,Sita Mangen L 05/14/2013, 8:47 AM

## 2013-05-14 NOTE — Progress Notes (Signed)
I went back to check on patient after paracentesis after 8.4L removed.  RN reported mild hypotension 85/60.  Pt denies cp, sob, n/v/d, dizziness.  Pt states he is breathing better after the paracentesis  I ordered 500cc NS bolus and 500cc of albumin.  VS--HR 86- 98/56 after NS while albumin currently infusing CV RRR Lung- fine bibasilar crackles  I ordered albumin 25g IV x 4 every 6 hrs.  Continue NS at 75cc/hr  DTat

## 2013-05-14 NOTE — Procedures (Addendum)
US guided RLQ para  8.4L milky brown fluid tolerated well Had IV Albumin prior to procedure on floor  BP 109/52 post procedure

## 2013-05-15 DIAGNOSIS — E871 Hypo-osmolality and hyponatremia: Secondary | ICD-10-CM

## 2013-05-15 LAB — BASIC METABOLIC PANEL
CO2: 24 mEq/L (ref 19–32)
Calcium: 8.4 mg/dL (ref 8.4–10.5)
Creatinine, Ser: 1.44 mg/dL — ABNORMAL HIGH (ref 0.50–1.35)
GFR calc Af Amer: 54 mL/min — ABNORMAL LOW (ref >90)
GFR calc non Af Amer: 46 mL/min — ABNORMAL LOW (ref >90)
Potassium: 4 mEq/L (ref 3.5–5.1)

## 2013-05-15 LAB — OSMOLALITY, URINE: Osmolality, Ur: 600 mOsm/kg (ref 390–1090)

## 2013-05-15 LAB — CBC
Hemoglobin: 11.3 g/dL — ABNORMAL LOW (ref 13.0–17.0)
Platelets: ADEQUATE 10*3/uL (ref 150–400)
RBC: 3.57 MIL/uL — ABNORMAL LOW (ref 4.22–5.81)
RDW: 15.4 % (ref 11.5–15.5)
WBC: 5.1 10*3/uL (ref 4.0–10.5)

## 2013-05-15 LAB — GLUCOSE, CAPILLARY
Glucose-Capillary: 116 mg/dL — ABNORMAL HIGH (ref 70–99)
Glucose-Capillary: 164 mg/dL — ABNORMAL HIGH (ref 70–99)

## 2013-05-15 MED ORDER — BIOTENE DRY MOUTH MT LIQD: 15.0000 mL | OROMUCOSAL | Status: DC | PRN

## 2013-05-15 MED ORDER — MENTHOL 3 MG MT LOZG
1.0000 | LOZENGE | OROMUCOSAL | Status: DC | PRN
  Filled 2013-05-15: qty 9

## 2013-05-15 MED ORDER — SALINE SPRAY 0.65 % NA SOLN
1.0000 | NASAL | Status: DC | PRN
  Administered 2013-05-15: 1 via NASAL
  Filled 2013-05-15: qty 44

## 2013-05-15 MED ORDER — SPIRONOLACTONE 100 MG PO TABS
100.0000 mg | ORAL_TABLET | Freq: Every day | ORAL | Status: DC
  Administered 2013-05-15 – 2013-05-17 (×3): 100 mg via ORAL
  Filled 2013-05-15 (×3): qty 1

## 2013-05-15 NOTE — Progress Notes (Signed)
[facility] PROGRESS NOTE  Jerry [name] ZOX:[id number] DOB: 1941-06-13 DOA: 05/13/2013 PCP: Thora Lance, MD  Assessment/Plan: Hyponatremia  -multifactorial including his liver disease, volume depletion, and medications (Aldactone), SIADH  -check uric acid--8.8 -urine osm--600, serum osm--272-->suggests a degree of SIADH  -urine Na and urine Creatinine-->FeNa < 1%  -aldactone restarted today -may need renal evaluation if no improvement  -diuretics restarted 11/24 for one day after holding during 11/22 and 11/23  -continue gentle hydration with NS  Liver cirrhosis (NASH) with ascites  -repeat paracentesis removed 8.4 L  -lower clinical suspicion of SBP  -not sure he will be able to tolerate lasix and aldactone long term  Acute on chronic renal Failure  -due to volume depletion and 3rd spacing of fluid  -doubt hepatorenal syndrome  -albumin after paracentesis  -renal us--no hydronephrosis -albumin 25 grams q6 hrs x 4  -Improving with hydration and albumin Dyspnea  -due to massive ascites compressing diaphragm  -Improved after paracentesis Diabetes mellitus type 2-controlled  -HbA1C= 6.9 on 04/07/13  -will not plan to restart metformin due to wide fluctuations in renal function and renal insufficiency  -Novolog sliding scale while inpt  -CBGs controlled  HTN  -d/c diuretics and ARB  -BP is soft but stable  Family Communication: Wife and daughter at beside updated Disposition Plan: home when cleared by GI         Procedures/Studies: Dg Esophagus  05/08/2013   CLINICAL DATA:  Dysphagia  EXAM: ESOPHOGRAM/BARIUM SWALLOW  TECHNIQUE: Single contrast examination was performed using  thin barium.  COMPARISON:  CT abdomen pelvis of 04/06/2013  FLUOROSCOPY TIME:  2 min 12 seconds  FINDINGS: In view of the clinical question of aspiration, this study was begun on the lateral projection with a rapid sequence spot films performed. The swallowing mechanism is unremarkable.  No aspiration is demonstrated. There are mild to moderate tertiary contractions in the mid and distal esophagus. Within the distal esophagus there is an indentation laterally with irregular margins. 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 A distal esophageal mucosal lesion cannot be excluded. No reflux is seen. A barium pill was given at the end of the study which passed into the stomach without delay.  IMPRESSION: 1. No aspiration. 2. 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 An intraluminal distal esophageal lesion cannot be excluded. 3. Mild to moderate tertiary contractions in the distal esophagus.   Electronically Signed   By: Dwyane Dee M.D.   On: 05/08/2013 15:45   US Renal  05/14/2013   CLINICAL DATA:  History of acute on chronic renal failure. History of hepatic cirrhosis.  EXAM: RENAL/URINARY TRACT ULTRASOUND COMPLETE  COMPARISON:  CT 04/06/2013.  FINDINGS: Right Kidney  Length: Right renal length is 11.1 cm. There is slight increased echogenicity of the parenchyma with decrease in the differentiation between cortex and medulla consistent with medical renal disease. No mass, calculus, parenchymal loss, or hydronephrosis visualized.  Left Kidney  Length: Left renal length is 11.2 cm. There is slight increased echogenicity of the parenchyma with decrease in the differentiation between cortex and medulla consistent with medical renal disease. No mass, calculus, parenchymal loss, or hydronephrosis visualized.  Bladder  No urinary bladder abnormality is evident. There is indentation of the bladder base by prostate. Prostate measures 5.4 x 5.1 x 3.7 cm.  There is ascites.  IMPRESSION: Kidneys are normal size with  no evidence of parenchymal loss. No hydronephrosis is seen.  There is slight increased echogenicity of the parenchyma with  decrease in the differentiation between cortex and medulla consistent with medical renal disease.  No intrinsic abnormality of the urinary bladder is seen. There is indentation of the bladder base by prostate gland. Prostate measures 5.4 x 5.1 x 3.7 cm.  There is ascites.   Electronically Signed   By: [name]  Call M.D.   On: 05/14/2013 15:40   US Paracentesis  05/14/2013   CLINICAL DATA:  Abdominal ascites  EXAM: ULTRASOUND GUIDED RIGHT LOWER QUADRANT PARACENTESIS  COMPARISON:  Previous paracentesis  PROCEDURE: An ultrasound guided paracentesis was thoroughly discussed with the patient and questions answered. The benefits, risks, alternatives and complications were also discussed. The patient understands and wishes to proceed with the procedure. Written consent was obtained.  Ultrasound was performed to localize and mark an adequate pocket of fluid in the right lower quadrant of the abdomen. The area was then prepped and draped in the normal sterile fashion. 1% Lidocaine was used for local anesthesia. Under ultrasound guidance a 19 gauge Yeuh catheter was introduced. Paracentesis was performed. The catheter was removed and a dressing applied.  Complications: none  FINDINGS: A total of approximately 8.4 liters of milky brown fluid was removed. A fluid sample was not sent for laboratory analysis.  IMPRESSION: Successful ultrasound guided paracentesis yielding 8.4 liters of ascites.  IV albumin administered prior to procedure per MD.  BP 109/52 post Procedure the  Read by: Beckey Downing PA-C   Electronically Signed   By: Oley Balm M.D.   On: 05/14/2013 15:01   US Paracentesis  04/28/2013   CLINICAL DATA:  Cirrhosis, recurrent ascites  EXAM: ULTRASOUND GUIDED PARACENTESIS  TECHNIQUE: Survey ultrasound of the abdomen was performed and an appropriate skin entry site in the RLQ abdomen was selected. Skin site was marked, prepped with Betadine, and draped in usual sterile fashion, and infiltrated locally with 1%  lidocaine. A 5 French multisidehole Yueh sheath needle was advanced into the peritoneal space until fluid could be aspirated. The sheath was advanced and the needle removed. 7.5 liters of cloudy/milkyascites were aspirated. No immediate complication.  IMPRESSION: Technically successful ultrasound guided paracentesis, removing 7.5 liters of ascites.  Read By:  Pattricia Boss PA-C   Electronically Signed   By: Oley Balm M.D.   On: 04/28/2013 13:04         Subjective: Patient denies dizziness, headache, fevers, chills, chest pain, shortness breath, nausea, vomiting, diarrhea, abdominal pain, dysuria.  Objective: Filed Vitals:   05/15/13 0721 05/15/13 0847 05/15/13 1128 05/15/13 1249  BP: 106/52 105/58 107/58 137/73  Pulse: 91 93 90 94  Temp: 98.5 F (36.9 C) 98.4 F (36.9 C) 98.1 F (36.7 C) 98.1 F (36.7 C)  TempSrc: Oral Oral Oral Oral  Resp: 14 17 18 18   Height:      Weight:      SpO2: 94% 93% 98% 97%    Intake/Output Summary (Last 24 hours) at 05/15/13 1524 Last data filed at 05/15/13 1130  Gross per 24 hour  Intake   1280 ml  Output   1075 ml  Net    205 ml   Weight change:  Exam:   General:  Pt is alert, follows commands appropriately, not in acute distress  HEENT: No icterus, No thrush,[place]/AT  Cardiovascular: RRR, S1/S2, no rubs, no gallops  Respiratory: Fine bibasilar crackles. No wheezing. Good air movement.  Abdomen: Soft/+BS, non tender, mildly  distended, no guarding  Extremities: No edema, No lymphangitis, No petechiae, No rashes, no synovitis  Data Reviewed: Basic Metabolic Panel:  Recent Labs Lab 05/13/13 2055 05/14/13 0355 05/15/13 0433  NA 120* 120* 126*  K 4.7 4.4 4.0  CL 80* 82* 90*  CO2 23 22 24   GLUCOSE 159* 219* 139*  BUN 50* 51* 43*  CREATININE 1.95* 1.82* 1.44*  CALCIUM 9.3 8.7 8.4   Liver Function Tests:  Recent Labs Lab 05/13/13 2055  AST 37  ALT 27  ALKPHOS 107  BILITOT 1.2  PROT 6.7  ALBUMIN 2.7*   No results  found for this basename: LIPASE, AMYLASE,  in the last 168 hours No results found for this basename: AMMONIA,  in the last 168 hours CBC:  Recent Labs Lab 05/13/13 2055 05/14/13 0355 05/15/13 0433  WBC 12.5* 9.8 5.1  NEUTROABS 9.9*  --   --   HGB 14.0 12.8* 11.3*  HCT 39.2 35.9* 32.0*  MCV 89.1 88.9 89.6  PLT 188 175 PLATELET CLUMPS NOTED ON SMEAR, COUNT APPEARS ADEQUATE   Cardiac Enzymes: No results found for this basename: CKTOTAL, CKMB, CKMBINDEX, TROPONINI,  in the last 168 hours BNP: No components found with this basename: POCBNP,  CBG:  Recent Labs Lab 05/14/13 1223 05/14/13 1624 05/14/13 2123 05/15/13 0744 05/15/13 1206  GLUCAP 169* 93 201* 116* 179*    No results found for this or any previous visit (from the past 240 hour(s)).   Scheduled Meds: . albumin human  25 g Intravenous Q6H  . heparin  5,000 Units Subcutaneous Q8H  . insulin aspart  0-15 Units Subcutaneous TID WC  . pantoprazole  40 mg Oral Daily  . spironolactone  100 mg Oral Daily  . tamsulosin  0.4 mg Oral Daily   Continuous Infusions: . sodium chloride 75 mL/hr at 05/14/13 2124     Eduar Kumpf, DO  [facility] Pager [phone]  If 7PM-7AM, please contact night-coverage [email or web address] Password [email or web address] 05/15/2013, 3:24 PM   LOS: 2 days

## 2013-05-15 NOTE — Progress Notes (Signed)
[facility] PROGRESS NOTE Subjective Pt feels much better after 8.4 liters fluid removed at paracentesis but due to dec BP had to have fluid bolus. Per wife he has previously been treated with spironolactone 50mg  with the lasix  Objective: Vital signs in last 24 hours: Temp:  [98 F (36.7 C)-99 F (37.2 C)] 98.5 F (36.9 C) (11/27 0721) Pulse Rate:  [82-95] 91 (11/27 0721) Resp:  [14-20] 14 (11/27 0721) BP: (85-126)/(52-68) 106/52 mmHg (11/27 0721) SpO2:  [92 %-98 %] 94 % (11/27 0721) Last BM Date: 05/14/13  Intake/Output from previous day: 11/26 0701 - 11/27 0700 In: 2280 [P.O.:480; I.V.:1700; IV Piggyback:100] Out: 1050 [Urine:1050] Intake/Output this shift:    PE: General-- Heart-- Lungs-- Abdomen--much less distended, soft  Lab Results:  Recent Labs  05/13/13 2055 05/14/13 0355 05/15/13 0433  WBC 12.5* 9.8 5.1  HGB 14.0 12.8* 11.3*  HCT 39.2 35.9* 32.0*  PLT 188 175 PLATELET CLUMPS NOTED ON SMEAR, COUNT APPEARS ADEQUATE   BMET  Recent Labs  05/13/13 2055 05/14/13 0355 05/15/13 0433  NA 120* 120* 126*  K 4.7 4.4 4.0  CL 80* 82* 90*  CO2 23 22 24   CREATININE 1.95* 1.82* 1.44*   LFT  Recent Labs  05/13/13 2055  PROT 6.7  AST 37  ALT 27  ALKPHOS 107  BILITOT 1.2   PT/INR  Recent Labs  05/13/13 2055  LABPROT 16.2*  INR 1.33   PANCREAS No results found for this basename: LIPASE,  in the last 72 hours       Studies/Results: US Renal  05/14/2013   CLINICAL DATA:  History of acute on chronic renal failure. History of hepatic cirrhosis.  EXAM: RENAL/URINARY TRACT ULTRASOUND COMPLETE  COMPARISON:  CT 04/06/2013.  FINDINGS: Right Kidney  Length: Right renal length is 11.1 cm. There is slight increased echogenicity of the parenchyma with decrease in the differentiation between cortex and medulla consistent with medical renal disease. No mass, calculus, parenchymal loss, or hydronephrosis visualized.  Left Kidney  Length: Left renal  length is 11.2 cm. There is slight increased echogenicity of the parenchyma with decrease in the differentiation between cortex and medulla consistent with medical renal disease. No mass, calculus, parenchymal loss, or hydronephrosis visualized.  Bladder  No urinary bladder abnormality is evident. There is indentation of the bladder base by prostate. Prostate measures 5.4 x 5.1 x 3.7 cm.  There is ascites.  IMPRESSION: Kidneys are normal size with no evidence of parenchymal loss. No hydronephrosis is seen.  There is slight increased echogenicity of the parenchyma with decrease in the differentiation between cortex and medulla consistent with medical renal disease.  No intrinsic abnormality of the urinary bladder is seen. There is indentation of the bladder base by prostate gland. Prostate measures 5.4 x 5.1 x 3.7 cm.  There is ascites.   Electronically Signed   By: [name]  Call M.D.   On: 05/14/2013 15:40   US Paracentesis  05/14/2013   CLINICAL DATA:  Abdominal ascites  EXAM: ULTRASOUND GUIDED RIGHT LOWER QUADRANT PARACENTESIS  COMPARISON:  Previous paracentesis  PROCEDURE: An ultrasound guided paracentesis was thoroughly discussed with the patient and questions answered. The benefits, risks, alternatives and complications were also discussed. The patient understands and wishes to proceed with the procedure. Written consent was obtained.  Ultrasound was performed to localize and mark an adequate pocket of fluid in the right lower quadrant of the abdomen. The area was then prepped and draped in the normal sterile fashion. 1% Lidocaine was used  for local anesthesia. Under ultrasound guidance a 19 gauge Yeuh catheter was introduced. Paracentesis was performed. The catheter was removed and a dressing applied.  Complications: none  FINDINGS: A total of approximately 8.4 liters of milky brown fluid was removed. A fluid sample was not sent for laboratory analysis.  IMPRESSION: Successful ultrasound guided paracentesis  yielding 8.4 liters of ascites.  IV albumin administered prior to procedure per MD.  BP 109/52 post Procedure the  Read by: Beckey Downing PA-C   Electronically Signed   By: Oley Balm M.D.   On: 05/14/2013 15:01    Medications: I have reviewed the patient's current medications.  Assessment/Plan: 1. Ascites. Cirrhosis due to NASH May end up as diuretic failure, but would go ahead and try spirolactone 100-200 qd and then slowly add lasix  Will start with 100 today.   Regino Fournet JR,Ladainian Therien L 05/15/2013, 8:23 AM

## 2013-05-16 LAB — BASIC METABOLIC PANEL
BUN: 31 mg/dL — ABNORMAL HIGH (ref 6–23)
Creatinine, Ser: 1.27 mg/dL (ref 0.50–1.35)
GFR calc Af Amer: 63 mL/min — ABNORMAL LOW (ref >90)
GFR calc non Af Amer: 54 mL/min — ABNORMAL LOW (ref >90)
Glucose, Bld: 119 mg/dL — ABNORMAL HIGH (ref 70–99)
Sodium: 129 mEq/L — ABNORMAL LOW (ref 135–145)

## 2013-05-16 LAB — GLUCOSE, CAPILLARY
Glucose-Capillary: 161 mg/dL — ABNORMAL HIGH (ref 70–99)
Glucose-Capillary: 182 mg/dL — ABNORMAL HIGH (ref 70–99)
Glucose-Capillary: 167 mg/dL — ABNORMAL HIGH (ref 70–99)

## 2013-05-16 MED ORDER — FUROSEMIDE 20 MG PO TABS
20.0000 mg | ORAL_TABLET | Freq: Every day | ORAL | Status: DC
  Administered 2013-05-16 – 2013-05-17 (×2): 20 mg via ORAL
  Filled 2013-05-16 (×2): qty 1

## 2013-05-16 NOTE — Progress Notes (Signed)
[facility] PROGRESS NOTE  Ivor Kishi ZOX:[id number] DOB: 02-04-1941 DOA: 05/13/2013 PCP: Thora Lance, MD  Assessment/Plan: Hyponatremia  -multifactorial including his liver disease, volume depletion, and medications (Aldactone), SIADH  -check uric acid--8.8  -urine osm--600, serum osm--272-->suggests a degree of SIADH  -urine Na and urine Creatinine-->FeNa < 1%  -aldactone restarted 11/27, lasix restarted 11/28 -diuretics restarted 11/24 for one day after holding during 11/22 and 11/23 in outpt setting - saline lock NS  -improved with saline; suspect he will continue to have some degree of hyponatremia due to his cirrhosis and ascites Liver cirrhosis (NASH) with ascites  -05/14/13 repeat paracentesis removed 8.4 L  -lower clinical suspicion of SBP  -not sure he will be able to tolerate lasix and aldactone long term  Acute on chronic renal Failure  -due to volume depletion and 3rd spacing of fluid  -doubt hepatorenal syndrome  -albumin given after paracentesis  -renal us--no hydronephrosis  -albumin 25 grams q6 hrs x 4 was given during admission in addition to 500cc additional albumin -Improved with hydration and albumin  -monitor as pt restarted on diuretics Dyspnea  -due to massive ascites compressing diaphragm  -Improved after paracentesis  Diabetes mellitus type 2-controlled  -HbA1C= 6.9 on 04/07/13  -will not plan to restart metformin due to wide fluctuations in renal function and renal insufficiency  -Novolog sliding scale while inpt  -CBGs controlled  HTN  -remain off  ARB  -BP is soft but stable  Deconditioning -PT and OT evaluation Family Communication: Wife and daughter at beside updated  Disposition Plan: home 05/17/13 if ok with GI          Procedures/Studies: Dg Esophagus  05/08/2013   CLINICAL DATA:  Dysphagia  EXAM: ESOPHOGRAM/BARIUM SWALLOW  TECHNIQUE: Single contrast examination was performed using  thin barium.  COMPARISON:  CT  abdomen pelvis of 04/06/2013  FLUOROSCOPY TIME:  2 min 12 seconds  FINDINGS: In view of the clinical question of aspiration, this study was begun on the lateral projection with a rapid sequence spot films performed. The swallowing mechanism is unremarkable. No aspiration is demonstrated. There are mild to moderate tertiary contractions in the mid and distal esophagus. Within the distal esophagus there is an indentation laterally with irregular margins. 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 A distal esophageal mucosal lesion cannot be excluded. No reflux is seen. A barium pill was given at the end of the study which passed into the stomach without delay.  IMPRESSION: 1. No aspiration. 2. 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 An intraluminal distal esophageal lesion cannot be excluded. 3. Mild to moderate tertiary contractions in the distal esophagus.   Electronically Signed   By: Dwyane Dee M.D.   On: 05/08/2013 15:45   US Renal  05/14/2013   CLINICAL DATA:  History of acute on chronic renal failure. History of hepatic cirrhosis.  EXAM: RENAL/URINARY TRACT ULTRASOUND COMPLETE  COMPARISON:  CT 04/06/2013.  FINDINGS: Right Kidney  Length: Right renal length is 11.1 cm. There is slight increased echogenicity of the parenchyma with decrease in the differentiation between cortex and medulla consistent with medical renal disease. No mass, calculus, parenchymal loss, or hydronephrosis visualized.  Left Kidney  Length: Left renal length is 11.2 cm. There is slight increased echogenicity of the parenchyma with decrease in the differentiation between cortex and medulla consistent with medical renal disease. No  mass, calculus, parenchymal loss, or hydronephrosis visualized.  Bladder  No urinary bladder abnormality is evident. There is  indentation of the bladder base by prostate. Prostate measures 5.4 x 5.1 x 3.7 cm.  There is ascites.  IMPRESSION: Kidneys are normal size with no evidence of parenchymal loss. No hydronephrosis is seen.  There is slight increased echogenicity of the parenchyma with decrease in the differentiation between cortex and medulla consistent with medical renal disease.  No intrinsic abnormality of the urinary bladder is seen. There is indentation of the bladder base by prostate gland. Prostate measures 5.4 x 5.1 x 3.7 cm.  There is ascites.   Electronically Signed   By: [name]  Call M.D.   On: 05/14/2013 15:40   US Paracentesis  05/14/2013   CLINICAL DATA:  Abdominal ascites  EXAM: ULTRASOUND GUIDED RIGHT LOWER QUADRANT PARACENTESIS  COMPARISON:  Previous paracentesis  PROCEDURE: An ultrasound guided paracentesis was thoroughly discussed with the patient and questions answered. The benefits, risks, alternatives and complications were also discussed. The patient understands and wishes to proceed with the procedure. Written consent was obtained.  Ultrasound was performed to localize and mark an adequate pocket of fluid in the right lower quadrant of the abdomen. The area was then prepped and draped in the normal sterile fashion. 1% Lidocaine was used for local anesthesia. Under ultrasound guidance a 19 gauge Yeuh catheter was introduced. Paracentesis was performed. The catheter was removed and a dressing applied.  Complications: none  FINDINGS: A total of approximately 8.4 liters of milky brown fluid was removed. A fluid sample was not sent for laboratory analysis.  IMPRESSION: Successful ultrasound guided paracentesis yielding 8.4 liters of ascites.  IV albumin administered prior to procedure per MD.  BP 109/52 post Procedure the  Read by: Beckey Downing PA-C   Electronically Signed   By: Oley Balm M.D.   On: 05/14/2013 15:01   US Paracentesis  04/28/2013   CLINICAL DATA:  Cirrhosis, recurrent ascites  EXAM:  ULTRASOUND GUIDED PARACENTESIS  TECHNIQUE: Survey ultrasound of the abdomen was performed and an appropriate skin entry site in the RLQ abdomen was selected. Skin site was marked, prepped with Betadine, and draped in usual sterile fashion, and infiltrated locally with 1% lidocaine. A 5 French multisidehole Yueh sheath needle was advanced into the peritoneal space until fluid could be aspirated. The sheath was advanced and the needle removed. 7.5 liters of cloudy/milkyascites were aspirated. No immediate complication.  IMPRESSION: Technically successful ultrasound guided paracentesis, removing 7.5 liters of ascites.  Read By:  Pattricia Boss PA-C   Electronically Signed   By: Oley Balm M.D.   On: 04/28/2013 13:04         Subjective: Patient feels generalized weakness. Denies any fevers, chills, chest discomfort, shortness breath, nausea, vomiting, diarrhea, abdominal pain, dizziness, syncope.  Objective: Filed Vitals:   05/15/13 1700 05/15/13 1850 05/15/13 2138 05/16/13 0628  BP: 98/60 111/60 112/63 106/60  Pulse: 89 93 92 89  Temp: 98.2 F (36.8 C) 98.1 F (36.7 C) 98.1 F (36.7 C) 98.3 F (36.8 C)  TempSrc: Oral Oral Oral Oral  Resp: 16 18 16 16   Height:      Weight:      SpO2: 98% 95% 95% 93%    Intake/Output Summary (Last 24 hours) at 05/16/13 1732 Last data filed at 05/16/13 1555  Gross per 24 hour  Intake    905 ml  Output    600 ml  Net    305 ml  Weight change:  Exam:   General:  Pt is alert, follows commands appropriately, not in acute distress  HEENT: No icterus, No thrush, No neck mass, [place]/AT  Cardiovascular: RRR, S1/S2, no rubs, no gallops  Respiratory: Fine bibasilar crackles. No wheezing. Good air movement.  Abdomen: Soft/+BS, non tender, mildly distended, no guarding  Extremities: No edema, No lymphangitis, No petechiae, No rashes, no synovitis  Data Reviewed: Basic Metabolic Panel:  Recent Labs Lab 05/13/13 2055 05/14/13 0355  05/15/13 0433 05/16/13 0405  NA 120* 120* 126* 129*  K 4.7 4.4 4.0 4.2  CL 80* 82* 90* 96  CO2 23 22 24 22   GLUCOSE 159* 219* 139* 119*  BUN 50* 51* 43* 31*  CREATININE 1.95* 1.82* 1.44* 1.27  CALCIUM 9.3 8.7 8.4 8.5  MG  --   --   --  2.4   Liver Function Tests:  Recent Labs Lab 05/13/13 2055  AST 37  ALT 27  ALKPHOS 107  BILITOT 1.2  PROT 6.7  ALBUMIN 2.7*   No results found for this basename: LIPASE, AMYLASE,  in the last 168 hours No results found for this basename: AMMONIA,  in the last 168 hours CBC:  Recent Labs Lab 05/13/13 2055 05/14/13 0355 05/15/13 0433  WBC 12.5* 9.8 5.1  NEUTROABS 9.9*  --   --   HGB 14.0 12.8* 11.3*  HCT 39.2 35.9* 32.0*  MCV 89.1 88.9 89.6  PLT 188 175 PLATELET CLUMPS NOTED ON SMEAR, COUNT APPEARS ADEQUATE   Cardiac Enzymes: No results found for this basename: CKTOTAL, CKMB, CKMBINDEX, TROPONINI,  in the last 168 hours BNP: No components found with this basename: POCBNP,  CBG:  Recent Labs Lab 05/15/13 1653 05/15/13 2136 05/16/13 0753 05/16/13 1145 05/16/13 1648  GLUCAP 140* 164* 110* 161* 182*    No results found for this or any previous visit (from the past 240 hour(s)).   Scheduled Meds: . furosemide  20 mg Oral Daily  . heparin  5,000 Units Subcutaneous Q8H  . insulin aspart  0-15 Units Subcutaneous TID WC  . pantoprazole  40 mg Oral Daily  . spironolactone  100 mg Oral Daily  . tamsulosin  0.4 mg Oral Daily   Continuous Infusions:    Stevon Gough, DO  [facility] Pager [phone]  If 7PM-7AM, please contact night-coverage [email or web address] Password [email or web address] 05/16/2013, 5:32 PM   LOS: 3 days

## 2013-05-16 NOTE — Progress Notes (Signed)
[facility] PROGRESS NOTE Subjective patient feels okay. He is eating without any difficulty abdomen feels much better. According to he and his wife, he has been on very low sodium diet with fluid restriction. It's not clear exactly what the Aldactone Lasix did to him but make him feel bad in the past.  Objective: Vital signs in last 24 hours: Temp:  [98.1 F (36.7 C)-98.4 F (36.9 C)] 98.3 F (36.8 C) (11/28 0628) Pulse Rate:  [89-94] 89 (11/28 0628) Resp:  [16-18] 16 (11/28 0628) BP: (98-137)/(58-73) 106/60 mmHg (11/28 0628) SpO2:  [93 %-98 %] 93 % (11/28 0628) Last BM Date: 05/15/13  Intake/Output from previous day: 11/27 0701 - 11/28 0700 In: 2615 [P.O.:990; I.V.:1425; IV Piggyback:200] Out: 550 [Urine:550] Intake/Output this shift:    PE: General-- patient sitting in a chair eating breakfast Heart-- Lungs-- Abdomen-- distended and soft and nontender  Lab Results:  Recent Labs  05/13/13 2055 05/14/13 0355 05/15/13 0433  WBC 12.5* 9.8 5.1  HGB 14.0 12.8* 11.3*  HCT 39.2 35.9* 32.0*  PLT 188 175 PLATELET CLUMPS NOTED ON SMEAR, COUNT APPEARS ADEQUATE   BMET  Recent Labs  05/13/13 2055 05/14/13 0355 05/15/13 0433 05/16/13 0405  NA 120* 120* 126* 129*  K 4.7 4.4 4.0 4.2  CL 80* 82* 90* 96  CO2 23 22 24 22   CREATININE 1.95* 1.82* 1.44* 1.27   LFT  Recent Labs  05/13/13 2055  PROT 6.7  AST 37  ALT 27  ALKPHOS 107  BILITOT 1.2   PT/INR  Recent Labs  05/13/13 2055  LABPROT 16.2*  INR 1.33   PANCREAS No results found for this basename: LIPASE,  in the last 72 hours       Studies/Results: US Renal  05/14/2013   CLINICAL DATA:  History of acute on chronic renal failure. History of hepatic cirrhosis.  EXAM: RENAL/URINARY TRACT ULTRASOUND COMPLETE  COMPARISON:  CT 04/06/2013.  FINDINGS: Right Kidney  Length: Right renal length is 11.1 cm. There is slight increased echogenicity of the parenchyma with decrease in the differentiation  between cortex and medulla consistent with medical renal disease. No mass, calculus, parenchymal loss, or hydronephrosis visualized.  Left Kidney  Length: Left renal length is 11.2 cm. There is slight increased echogenicity of the parenchyma with decrease in the differentiation between cortex and medulla consistent with medical renal disease. No mass, calculus, parenchymal loss, or hydronephrosis visualized.  Bladder  No urinary bladder abnormality is evident. There is indentation of the bladder base by prostate. Prostate measures 5.4 x 5.1 x 3.7 cm.  There is ascites.  IMPRESSION: Kidneys are normal size with no evidence of parenchymal loss. No hydronephrosis is seen.  There is slight increased echogenicity of the parenchyma with decrease in the differentiation between cortex and medulla consistent with medical renal disease.  No intrinsic abnormality of the urinary bladder is seen. There is indentation of the bladder base by prostate gland. Prostate measures 5.4 x 5.1 x 3.7 cm.  There is ascites.   Electronically Signed   By: [name]  Call M.D.   On: 05/14/2013 15:40   US Paracentesis  05/14/2013   CLINICAL DATA:  Abdominal ascites  EXAM: ULTRASOUND GUIDED RIGHT LOWER QUADRANT PARACENTESIS  COMPARISON:  Previous paracentesis  PROCEDURE: An ultrasound guided paracentesis was thoroughly discussed with the patient and questions answered. The benefits, risks, alternatives and complications were also discussed. The patient understands and wishes to proceed with the procedure. Written consent was obtained.  Ultrasound was performed to localize  and mark an adequate pocket of fluid in the right lower quadrant of the abdomen. The area was then prepped and draped in the normal sterile fashion. 1% Lidocaine was used for local anesthesia. Under ultrasound guidance a 19 gauge Yeuh catheter was introduced. Paracentesis was performed. The catheter was removed and a dressing applied.  Complications: none  FINDINGS: A total of  approximately 8.4 liters of milky brown fluid was removed. A fluid sample was not sent for laboratory analysis.  IMPRESSION: Successful ultrasound guided paracentesis yielding 8.4 liters of ascites.  IV albumin administered prior to procedure per MD.  BP 109/52 post Procedure the  Read by: Beckey Downing PA-C   Electronically Signed   By: Oley Balm M.D.   On: 05/14/2013 15:01    Medications: I have reviewed the patient's current medications.  Assessment/Plan: 1. Intractable ascites. Long discussion with patient and life about treatment choices. He's only had the ascites now for approximately 2 months. Things did not work out well initially with the 1st attempt at treatment with diuretics. We discussed other options would like to try diuretics and conservative measures again. Plan: Will add Lasix 20 mg daily and continue on Aldactone 100 mg daily. Discussed need to avoid all sodium consult of the diet and to fluid restrictors much as possible. He has an appointment already on Monday at 10 AM for follow-up to be met in an office visit to Dr. Matthias Hughs 1 week later. At this point I would go ahead and discharge him on Lasix 20 mg daily, Aldactone 100 mg daily, extremely low sodium diet and attempts to fluid restriction. Hopefully will be able to adjust his diuretics up slowly as an outpatient and achieve success.   Cleaster Shiffer JR,Amilio Zehnder L 05/16/2013, 8:39 AM

## 2013-05-17 DIAGNOSIS — E119 Type 2 diabetes mellitus without complications: Secondary | ICD-10-CM

## 2013-05-17 LAB — BASIC METABOLIC PANEL
BUN: 32 mg/dL — ABNORMAL HIGH (ref 6–23)
CO2: 22 mEq/L (ref 19–32)
Calcium: 8.5 mg/dL (ref 8.4–10.5)
Chloride: 98 mEq/L (ref 96–112)
Creatinine, Ser: 1.22 mg/dL (ref 0.50–1.35)
GFR calc Af Amer: 66 mL/min — ABNORMAL LOW (ref >90)
GFR calc non Af Amer: 57 mL/min — ABNORMAL LOW (ref >90)
Glucose, Bld: 147 mg/dL — ABNORMAL HIGH (ref 70–99)
Potassium: 4.1 mEq/L (ref 3.5–5.1)
Sodium: 131 mEq/L — ABNORMAL LOW (ref 135–145)

## 2013-05-17 LAB — GLUCOSE, CAPILLARY
Glucose-Capillary: 121 mg/dL — ABNORMAL HIGH (ref 70–99)
Glucose-Capillary: 156 mg/dL — ABNORMAL HIGH (ref 70–99)

## 2013-05-17 MED ORDER — SPIRONOLACTONE 100 MG PO TABS: 100.0000 mg | ORAL_TABLET | Freq: Every day | ORAL | Status: DC

## 2013-05-17 MED ORDER — ESOMEPRAZOLE MAGNESIUM 20 MG PO CPDR: 20.0000 mg | DELAYED_RELEASE_CAPSULE | Freq: Every morning | ORAL | Status: AC

## 2013-05-17 MED ORDER — FUROSEMIDE 20 MG PO TABS: 20.0000 mg | ORAL_TABLET | Freq: Every day | ORAL | Status: DC

## 2013-05-17 MED ORDER — SITAGLIPTIN PHOSPHATE 50 MG PO TABS: 50.0000 mg | ORAL_TABLET | Freq: Every day | ORAL | Status: AC

## 2013-05-17 NOTE — Discharge Summary (Signed)
Physician Discharge Summary  Jerry [name] WUJ:[id number] DOB: September 08, 1940 DOA: 05/13/2013  PCP: Thora Lance, MD  Admit date: 05/13/2013 Discharge date: 05/17/2013  Recommendations for Outpatient Follow-up:  1. Pt will need to follow up with PCP in 2 weeks post discharge 2. Please obtain BMP to evaluate electrolytes and kidney function 3. Please also check CBC to evaluate Hg and Hct levels 4. Follow up with Dr. Matthias Hughs 05/28/13  Discharge Diagnoses:  Principal Problem:   Hyponatremia Active Problems:   Ascites   Acute on chronic renal failure   Shortness of breath   NAFLD (nonalcoholic fatty liver disease)   Diabetes mellitus, type II   Cirrhosis of liver not due to alcohol Hyponatremia  -multifactorial including his liver disease, volume depletion, and medications (Aldactone), SIADH  -check uric acid--8.8  -urine osm--600, serum osm--272-->suggests a degree of SIADH  -urine Na and urine Creatinine-->FeNa < 1%  -aldactone restarted 11/27, lasix restarted 11/28  -diuretics restarted 11/24 for one day after holding during 11/22 and 11/23 in outpt setting  - saline lock NS  -improved with saline; suspect he will continue to have some degree of hyponatremia due to his cirrhosis and ascites  Liver cirrhosis (NASH) with ascites  -05/14/13 repeat paracentesis removed 8.4 L  -lower clinical suspicion of SBP  -not sure he will be able to tolerate lasix and aldactone long term  Acute on chronic renal Failure  -due to volume depletion and 3rd spacing of fluid  -doubt hepatorenal syndrome  -albumin given after paracentesis  -renal us--no hydronephrosis  -albumin 25 grams q6 hrs x 4 was given during admission in addition to 500cc additional albumin  -Improved with hydration and albumin  -monitor as pt restarted on diuretics  -Serum creatinine 1.22 on the day of discharge Dyspnea  -due to massive ascites compressing diaphragm  -Improved after paracentesis  Diabetes mellitus type  2-controlled  -HbA1C= 6.9 on 04/07/13  -will not plan to restart metformin due to wide fluctuations in renal function and renal insufficiency  -Novolog sliding scale while inpt  -CBGs controlled  -Due to the patient's renal insufficiency, the patient's glyburide will also be discontinued due to risk of hypoglycemia -he will start on sitagliptin 50mg  daily - he needs to follow up with his primary care provider for continued management of his diabetes mellitus.  HTN  -remain off ARB  -BP is soft but stable  Deconditioning  -PT and OT evaluation  Family Communication: Wife and daughter at beside updated    Discharge Condition: stable  Disposition: home  Diet:low sodium Wt Readings from Last 3 Encounters:  05/14/13 92.1 kg (203 lb 0.7 oz)  04/09/13 98.476 kg (217 lb 1.6 oz)    History of present illness:  72 year old male with history of newly diagnosed nonalcoholic liver disease, diabetes mellitus, hypertension was hospitalized one month back with acute respiratory dysphagia in the setting of abdominal ascites requiring large-volume paracenteses. He was then started on Lasix and Aldactone upon discharge. During that admission he underwent 2 rounds of paracenteses. He then followed up with he is GI doctor Buccini and had another large fluid and paracenteses about 2 weeks back with 7.5 L of fluid removed. Patient has been on a salt restricted diet at home and continued Lasix and Aldactone. He followed up with Dr. Matthias Hughs  last week and was asked to stop the Lasix and Aldactone for a few days and resume it from yesterday as his renal function had declined.  He saw his PCP today for blood work  and was found to have worsened hyponatremia and renal function. Dr Matthias Hughs reviewed the lab work and ask the patient to come to the ED.  Patient reports that he has noticed increased abdominal girth for possible 1 week. Also feels very tired and has some dyspnea on exertion. He denies any fever, chills,  headache, dizziness, blurred vision, tinnitus, jaundice, nausea, vomiting, chest pain, palpitations, orthopnea, PND, abdominal pain, bowel or urinary symptoms. The patient's sodium improved with fluid hydration. Once he stabilized, his fluids were saline lock. Paracentesis was performed. He 8.4 L was removed. He was treated with albumin IV. Spironolactone and Lasix were reintroduced. His renal function remains stable. He was evaluated by physical therapy who did not feel that the patient needed any further home PT.     Consultants: [facility]  Discharge Exam: Filed Vitals:   05/17/13 0455  BP: 103/61  Pulse: 96  Temp: 98.5 F (36.9 C)  Resp: 16   Filed Vitals:   05/15/13 2138 05/16/13 0628 05/16/13 2138 05/17/13 0455  BP: 112/63 106/60 96/63 103/61  Pulse: 92 89 101 96  Temp: 98.1 F (36.7 C) 98.3 F (36.8 C) 99.2 F (37.3 C) 98.5 F (36.9 C)  TempSrc: Oral Oral Oral Oral  Resp: 16 16 16 16   Height:      Weight:      SpO2: 95% 93%  97%   General: A&O x 3, NAD, pleasant, cooperative Cardiovascular: RRR, no rub, no gallop, no S3 Respiratory: CTAB, no wheeze, no rhonchi Abdomen:soft, nontender, nondistended, positive bowel sounds Extremities: No edema, No lymphangitis, no petechiae  Discharge Instructions  Discharge Orders   Future Appointments Provider Department Dept Phone   06/05/2013 8:30 AM Vevelyn Royals, RD [facility] [phone]   Future Orders Complete By Expires   Diet - low sodium heart healthy  As directed    Discharge instructions  As directed    Comments:     Stop taking glyburide, metformin Stop spironolactone 25mg  and lasix 40mg  Take lasix 20mg  once daily and spironolactone 100mg  once daily   Increase activity slowly  As directed        Medication List    STOP taking these medications       glyBURIDE micronized 3 MG tablet  Commonly known as:  GLYNASE     metFORMIN 500 MG 24 hr tablet  Commonly known  as:  GLUCOPHAGE-XR      TAKE these medications       esomeprazole 20 MG capsule  Commonly known as:  NEXIUM  Take 1 capsule (20 mg total) by mouth every morning.     furosemide 20 MG tablet  Commonly known as:  LASIX  Take 1 tablet (20 mg total) by mouth daily.     omeprazole-sodium bicarbonate 40-1100 MG per capsule  Commonly known as:  ZEGERID  Take 1 capsule by mouth every evening.     sitaGLIPtin 50 MG tablet  Commonly known as:  JANUVIA  Take 1 tablet (50 mg total) by mouth daily.     spironolactone 100 MG tablet  Commonly known as:  ALDACTONE  Take 1 tablet (100 mg total) by mouth daily.         The results of significant diagnostics from this hospitalization (including imaging, microbiology, ancillary and laboratory) are listed below for reference.    Significant Diagnostic Studies: Dg Esophagus  05/08/2013   CLINICAL DATA:  Dysphagia  EXAM: ESOPHOGRAM/BARIUM SWALLOW  TECHNIQUE: Single contrast examination was performed using  thin barium.  COMPARISON:  CT abdomen pelvis of 04/06/2013  FLUOROSCOPY TIME:  2 min 12 seconds  FINDINGS: In view of the clinical question of aspiration, this study was begun on the lateral projection with a rapid sequence spot films performed. The swallowing mechanism is unremarkable. No aspiration is demonstrated. There are mild to moderate tertiary contractions in the mid and distal esophagus. Within the distal esophagus there is an indentation laterally with irregular margins. 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 A distal esophageal mucosal lesion cannot be excluded. No reflux is seen. A barium pill was given at the end of the study which passed into the stomach without delay.  IMPRESSION: 1. No aspiration. 2. 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 An intraluminal  distal esophageal lesion cannot be excluded. 3. Mild to moderate tertiary contractions in the distal esophagus.   Electronically Signed   By: Dwyane Dee M.D.   On: 05/08/2013 15:45   US Renal  05/14/2013   CLINICAL DATA:  History of acute on chronic renal failure. History of hepatic cirrhosis.  EXAM: RENAL/URINARY TRACT ULTRASOUND COMPLETE  COMPARISON:  CT 04/06/2013.  FINDINGS: Right Kidney  Length: Right renal length is 11.1 cm. There is slight increased echogenicity of the parenchyma with decrease in the differentiation between cortex and medulla consistent with medical renal disease. No mass, calculus, parenchymal loss, or hydronephrosis visualized.  Left Kidney  Length: Left renal length is 11.2 cm. There is slight increased echogenicity of the parenchyma with decrease in the differentiation between cortex and medulla consistent with medical renal disease. No mass, calculus, parenchymal loss, or hydronephrosis visualized.  Bladder  No urinary bladder abnormality is evident. There is indentation of the bladder base by prostate. Prostate measures 5.4 x 5.1 x 3.7 cm.  There is ascites.  IMPRESSION: Kidneys are normal size with no evidence of parenchymal loss. No hydronephrosis is seen.  There is slight increased echogenicity of the parenchyma with decrease in the differentiation between cortex and medulla consistent with medical renal disease.  No intrinsic abnormality of the urinary bladder is seen. There is indentation of the bladder base by prostate gland. Prostate measures 5.4 x 5.1 x 3.7 cm.  There is ascites.   Electronically Signed   By: [name]  Call M.D.   On: 05/14/2013 15:40   US Paracentesis  05/14/2013   CLINICAL DATA:  Abdominal ascites  EXAM: ULTRASOUND GUIDED RIGHT LOWER QUADRANT PARACENTESIS  COMPARISON:  Previous paracentesis  PROCEDURE: An ultrasound guided paracentesis was thoroughly discussed with the patient and questions answered. The benefits, risks, alternatives and complications  were also discussed. The patient understands and wishes to proceed with the procedure. Written consent was obtained.  Ultrasound was performed to localize and mark an adequate pocket of fluid in the right lower quadrant of the abdomen. The area was then prepped and draped in the normal sterile fashion. 1% Lidocaine was used for local anesthesia. Under ultrasound guidance a 19 gauge Yeuh catheter was introduced. Paracentesis was performed. The catheter was removed and a dressing applied.  Complications: none  FINDINGS: A total of approximately 8.4 liters of milky brown fluid was removed. A fluid sample was not sent for laboratory analysis.  IMPRESSION: Successful ultrasound guided paracentesis yielding 8.4 liters of ascites.  IV albumin administered prior to procedure per MD.  BP 109/52 post Procedure the  Read by: Elita Quick  Carleene Mains PA-C   Electronically Signed   By: Oley Balm M.D.   On: 05/14/2013 15:01   US Paracentesis  04/28/2013   CLINICAL DATA:  Cirrhosis, recurrent ascites  EXAM: ULTRASOUND GUIDED PARACENTESIS  TECHNIQUE: Survey ultrasound of the abdomen was performed and an appropriate skin entry site in the RLQ abdomen was selected. Skin site was marked, prepped with Betadine, and draped in usual sterile fashion, and infiltrated locally with 1% lidocaine. A 5 French multisidehole Yueh sheath needle was advanced into the peritoneal space until fluid could be aspirated. The sheath was advanced and the needle removed. 7.5 liters of cloudy/milkyascites were aspirated. No immediate complication.  IMPRESSION: Technically successful ultrasound guided paracentesis, removing 7.5 liters of ascites.  Read By:  Pattricia Boss PA-C   Electronically Signed   By: Oley Balm M.D.   On: 04/28/2013 13:04     Microbiology: No results found for this or any previous visit (from the past 240 hour(s)).   Labs: Basic Metabolic Panel:  Recent Labs Lab 05/13/13 2055 05/14/13 0355 05/15/13 0433 05/16/13 0405  05/17/13 0420  NA 120* 120* 126* 129* 131*  K 4.7 4.4 4.0 4.2 4.1  CL 80* 82* 90* 96 98  CO2 23 22 24 22 22   GLUCOSE 159* 219* 139* 119* 147*  BUN 50* 51* 43* 31* 32*  CREATININE 1.95* 1.82* 1.44* 1.27 1.22  CALCIUM 9.3 8.7 8.4 8.5 8.5  MG  --   --   --  2.4  --    Liver Function Tests:  Recent Labs Lab 05/13/13 2055  AST 37  ALT 27  ALKPHOS 107  BILITOT 1.2  PROT 6.7  ALBUMIN 2.7*   No results found for this basename: LIPASE, AMYLASE,  in the last 168 hours No results found for this basename: AMMONIA,  in the last 168 hours CBC:  Recent Labs Lab 05/13/13 2055 05/14/13 0355 05/15/13 0433  WBC 12.5* 9.8 5.1  NEUTROABS 9.9*  --   --   HGB 14.0 12.8* 11.3*  HCT 39.2 35.9* 32.0*  MCV 89.1 88.9 89.6  PLT 188 175 PLATELET CLUMPS NOTED ON SMEAR, COUNT APPEARS ADEQUATE   Cardiac Enzymes: No results found for this basename: CKTOTAL, CKMB, CKMBINDEX, TROPONINI,  in the last 168 hours BNP: No components found with this basename: POCBNP,  CBG:  Recent Labs Lab 05/16/13 1145 05/16/13 1648 05/16/13 2137 05/17/13 0741 05/17/13 1157  GLUCAP 161* 182* 167* 121* 156*    Time coordinating discharge:  Greater than 30 minutes  Signed:  Raif Chachere, DO [facility] Pager: [phone] 05/17/2013, 12:54 PM

## 2013-05-17 NOTE — Progress Notes (Signed)
Jerry [name] 11:03 AM  Subjective: Patient without any new complaints although his abdomen is getting slightly bigger he says  Objective: Vital signs stable afebrile no acute distress abdomen is pertinent for obvious ascites soft nontender however BUN and creatinine okay  Assessment: Jerry [name] cirrhosis complicated with ascites  Plan: Okay from my standpoint to go home repeat labs in our office early next week and followup with my partner Dr. B. in 2 weeks and we discussed low salt diet continuing diuretics and consideration of tips versus shunt in the future  [facility] E

## 2013-05-17 NOTE — Evaluation (Signed)
Physical Therapy Evaluation Patient Details Name: Jerry [name] MRN: [id number] DOB: [birth year] Today's Date: 05/17/2013 Time: 7829-5621 PT Time Calculation (min): 8 min  PT Assessment / Plan / Recommendation History of Present Illness  Pt with h/o liver disease admitted with hyponatremia and DOE  Clinical Impression  *Pt ok to DC home from PT standpoint, he is independent with mobility. **    PT Assessment  Patent does not need any further PT services    Follow Up Recommendations  No PT follow up    Does the patient have the potential to tolerate intense rehabilitation      Barriers to Discharge        Equipment Recommendations  None recommended by PT    Recommendations for Other Services     Frequency      Precautions / Restrictions Precautions Precautions: None Restrictions Weight Bearing Restrictions: No   Pertinent Vitals/Pain **0/10*      Mobility  Bed Mobility Bed Mobility: Not assessed Transfers Transfers: Sit to Stand;Stand to Sit Sit to Stand: 7: Independent Stand to Sit: 7: Independent Ambulation/Gait Ambulation/Gait Assistance: 7: Independent Ambulation Distance (Feet): 200 Feet Assistive device: None Ambulation/Gait Assistance Details: steady, no LOB Gait Pattern: Within Functional Limits Gait velocity: WNL    Exercises     PT Diagnosis:    PT Problem List:   PT Treatment Interventions:       PT Goals(Current goals can be found in the care plan section) Acute Rehab PT Goals Patient Stated Goal: to get some rest, care for his turkeys and donkeys PT Goal Formulation: No goals set, d/c therapy  Visit Information  Last PT Received On: 05/17/13 History of Present Illness: Pt with h/o liver disease admitted with hyponatremia and DOE       Prior Functioning  Home Living Family/patient expects to be discharged to:: Private residence Living Arrangements: Spouse/significant other Available Help at Discharge: Family;Available 24  hours/day Type of Home: House Home Access: Ramped entrance Home Layout: Two level;Able to live on main level with bedroom/bathroom Home Equipment: Gilmer Mor - single point Prior Function Level of Independence: Independent Comments: used cane when outside Communication Communication: No difficulties    Cognition  Cognition Arousal/Alertness: Awake/alert Behavior During Therapy: WFL for tasks assessed/performed Overall Cognitive Status: Within Functional Limits for tasks assessed    Extremity/Trunk Assessment Upper Extremity Assessment Upper Extremity Assessment: Overall WFL for tasks assessed Lower Extremity Assessment Lower Extremity Assessment: Overall WFL for tasks assessed Cervical / Trunk Assessment Cervical / Trunk Assessment: Normal   Balance    End of Session PT - End of Session Activity Tolerance: Patient tolerated treatment well Patient left: in chair;with call bell/phone within reach;with family/visitor present Nurse Communication: Mobility status  GP     Ralene Bathe Kistler 05/17/2013, 11:42 AM [phone]

## 2013-05-17 NOTE — Progress Notes (Signed)
Discharge to home, alert and oriented, no distress noted. D/c instructions done  Discussed with patient with the presence of the wife verbalized understanding.PIV removed no s/s of infiltration or swelling  Noted.

## 2013-05-22 ENCOUNTER — Other Ambulatory Visit (HOSPITAL_COMMUNITY): Payer: Self-pay | Admitting: Gastroenterology

## 2013-05-22 ENCOUNTER — Ambulatory Visit (HOSPITAL_COMMUNITY)
Admission: RE | Admit: 2013-05-22 | Discharge: 2013-05-22 | Disposition: A | Discharge status: Home / Self Care | Payer: Medicare Other | Source: Ambulatory Visit | Attending: Gastroenterology | Admitting: Gastroenterology

## 2013-05-22 DIAGNOSIS — R188 Other ascites: Secondary | ICD-10-CM | POA: Insufficient documentation

## 2013-05-22 DIAGNOSIS — K7460 Unspecified cirrhosis of liver: Secondary | ICD-10-CM | POA: Insufficient documentation

## 2013-05-22 MED ORDER — ALBUMIN HUMAN 25 % IV SOLN
12.5000 g | Freq: Once | INTRAVENOUS | Status: AC
  Administered 2013-05-22: 12.5 g via INTRAVENOUS
  Filled 2013-05-22: qty 50

## 2013-05-22 NOTE — Procedures (Signed)
Successful US guided paracentesis from RLQ.  Yielded 9.5 liters of milky yellow/light brown fluid.  No immediate complications.  Pt tolerated well.   Specimen was not sent for labs.  Pattricia Boss D PA-C 05/22/2013 11:51 AM

## 2013-05-29 ENCOUNTER — Other Ambulatory Visit (HOSPITAL_COMMUNITY): Payer: Self-pay | Admitting: Gastroenterology

## 2013-05-29 DIAGNOSIS — R188 Other ascites: Secondary | ICD-10-CM

## 2013-06-01 ENCOUNTER — Emergency Department (HOSPITAL_COMMUNITY): Payer: Medicare Other

## 2013-06-01 ENCOUNTER — Encounter (HOSPITAL_COMMUNITY): Payer: Self-pay | Admitting: Emergency Medicine

## 2013-06-01 ENCOUNTER — Emergency Department (HOSPITAL_COMMUNITY)
Admission: EM | Admit: 2013-06-01 | Discharge: 2013-06-01 | Disposition: A | Discharge status: Home / Self Care | Payer: Medicare Other | Attending: Emergency Medicine | Admitting: Emergency Medicine

## 2013-06-01 DIAGNOSIS — Z87891 Personal history of nicotine dependence: Secondary | ICD-10-CM | POA: Insufficient documentation

## 2013-06-01 DIAGNOSIS — R0989 Other specified symptoms and signs involving the circulatory and respiratory systems: Secondary | ICD-10-CM | POA: Insufficient documentation

## 2013-06-01 DIAGNOSIS — E119 Type 2 diabetes mellitus without complications: Secondary | ICD-10-CM | POA: Insufficient documentation

## 2013-06-01 DIAGNOSIS — I10 Essential (primary) hypertension: Secondary | ICD-10-CM | POA: Insufficient documentation

## 2013-06-01 DIAGNOSIS — R0609 Other forms of dyspnea: Secondary | ICD-10-CM | POA: Insufficient documentation

## 2013-06-01 DIAGNOSIS — Z79899 Other long term (current) drug therapy: Secondary | ICD-10-CM | POA: Insufficient documentation

## 2013-06-01 DIAGNOSIS — R188 Other ascites: Secondary | ICD-10-CM | POA: Insufficient documentation

## 2013-06-01 DIAGNOSIS — R0602 Shortness of breath: Secondary | ICD-10-CM | POA: Insufficient documentation

## 2013-06-01 DIAGNOSIS — K7460 Unspecified cirrhosis of liver: Secondary | ICD-10-CM

## 2013-06-01 MED ORDER — ALBUMIN HUMAN 25 % IV SOLN
25.0000 g | Freq: Once | INTRAVENOUS | Status: AC
  Administered 2013-06-01: 25 g via INTRAVENOUS
  Filled 2013-06-01: qty 100

## 2013-06-01 NOTE — ED Notes (Signed)
Pt scheduled to have paracentesis on Wed but over night pt went from 190.8lb  to 193.1lb with trouble breathing.

## 2013-06-01 NOTE — ED Provider Notes (Addendum)
CSN: [id number]     Arrival date & time 06/01/13  1115 History   First MD Initiated Contact with Patient 06/01/13 1138     Chief Complaint  Patient presents with  . Ascites   (Consider location/radiation/quality/duration/timing/severity/associated sxs/prior Treatment) HPI  72 year old male with abdominal discomfort and dyspnea secondary to ascites. 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 Scheduled to have a therapeutic paracentesis this Wednesday, but states that his symptoms have progressed to the point where he doesn't feel he can wait this long. Denies any significant abdominal pain. No fevers or chills. No unusual cough. No blood in her stool or melena.  Past Medical History  Diagnosis Date  . Diabetes mellitus without complication   . Hypertension   . 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 Back surgery    . Hernia repair     Family History  Problem Relation Age of Onset  . Diabetes Mellitus I Mother   . Congestive Heart Failure Mother    History  Substance Use Topics  . Smoking status: Former Games developer  . Smokeless tobacco: Not on file  . Alcohol Use: No    Review of Systems  All systems reviewed and negative, other than as noted in HPI.   Allergies  Review of patient's allergies indicates no known allergies.  Home Medications   Current Outpatient Rx  Name  Route  Sig  Dispense  Refill  . esomeprazole (NEXIUM) 20 MG capsule   Oral   Take 1 capsule (20 mg total) by mouth every morning.   30 capsule   1   . furosemide (LASIX) 20 MG tablet   Oral   Take 1 tablet (20 mg total) by mouth daily.   30 tablet   0   . omeprazole-sodium bicarbonate (ZEGERID) 40-1100 MG per capsule   Oral   Take 1 capsule by mouth every evening.          . sitaGLIPtin (JANUVIA) 50 MG tablet   Oral   Take 1 tablet (50 mg total) by mouth daily.   30 tablet   1   . spironolactone (ALDACTONE) 100  MG tablet   Oral   Take 1 tablet (100 mg total) by mouth daily.   30 tablet   0    BP 138/66  Pulse 102  Resp 19  SpO2 94% Physical Exam  Nursing note and vitals reviewed. Constitutional: He appears well-developed and well-nourished. No distress.  Laying in bed. Appears tired, but in no acute distress.  HENT:  Head: Normocephalic and atraumatic.  Eyes: Conjunctivae are normal. Right eye exhibits no discharge. Left eye exhibits no discharge.  Neck: Neck supple.  Cardiovascular: Normal rate, regular rhythm and normal heart sounds.  Exam reveals no gallop and no friction rub.   No murmur heard. Pulmonary/Chest: Effort normal and breath sounds normal. No respiratory distress.  Abdominal: Soft. He exhibits distension. There is no tenderness.  Abdominal distention. Soft. Nontender. Fluid wave.  Musculoskeletal: He exhibits no edema and no tenderness.  Neurological: He is alert.  Skin: Skin is warm and dry.  Psychiatric: He has a normal mood and affect. His behavior is normal. Thought content normal.    ED Course  PARACENTESIS Date/Time: 06/01/2013 1:00 PM Performed by: Raeford Razor Authorized by: Raeford Razor Consent: Verbal consent obtained. written consent obtained. Risks and benefits: risks, benefits and alternatives were discussed Consent given by: patient Required items: required blood products, implants,  devices, and special equipment available Patient identity confirmed: verbally with patient, arm band and provided demographic data Initial or subsequent exam: subsequent Procedure purpose: therapeutic Indications: abdominal discomfort secondary to ascites Indication comments: dyspnea 2/2 ascites Anesthesia: local infiltration Local anesthetic: lidocaine 1% without epinephrine Anesthetic total: 2 ml Patient sedated: no Preparation: Patient was prepped and draped in the usual sterile fashion. Needle gauge: 51F catheter. Ultrasound guidance: yes (largest fluid pocket  noted in RLQ) Puncture site: right lower quadrant Fluid removed: 7500(ml) Fluid characteristics: milky tan fluid. Dressing: pressure dressing Patient tolerance: Patient tolerated the procedure well with no immediate complications.   (including critical care time) Labs Review Labs Reviewed - No data to display Imaging Review No results found.  Dg Chest 2 View     EKG Interpretation   None       MDM   1. Shortness of breath   2. Cirrhosis of liver not due to alcohol   3. Ascites      11:51 AM Pt here with worsening dyspnea and abdominal distension. Doesn't think he can wait until upcoming scheduled paracentesis. Most recent labs reviewed and no significant thrombocytopenia and normal INR. Suspect little utility in repeating addtional studied today. Will check CXR but suspect symptoms related to increasing ascites. IV access for albumin. Paracentesis.   1:48 PM Pt tolerated well. Reports feeling much better. Anticipate DC after albumin done infusing.     Raeford Razor, MD 06/04/13 2006

## 2013-06-02 ENCOUNTER — Emergency Department (HOSPITAL_COMMUNITY): Payer: Medicare Other

## 2013-06-02 ENCOUNTER — Inpatient Hospital Stay (HOSPITAL_COMMUNITY)
Admission: EM | Admit: 2013-06-02 | Discharge: 2013-06-03 | DRG: 441 | Disposition: A | Discharge status: Home / Self Care | Payer: Medicare Other | Attending: Family Medicine | Admitting: Family Medicine

## 2013-06-02 ENCOUNTER — Encounter (HOSPITAL_COMMUNITY): Payer: Self-pay | Admitting: Emergency Medicine

## 2013-06-02 DIAGNOSIS — Z87891 Personal history of nicotine dependence: Secondary | ICD-10-CM

## 2013-06-02 DIAGNOSIS — R748 Abnormal levels of other serum enzymes: Secondary | ICD-10-CM

## 2013-06-02 DIAGNOSIS — Z79899 Other long term (current) drug therapy: Secondary | ICD-10-CM

## 2013-06-02 DIAGNOSIS — E8779 Other fluid overload: Secondary | ICD-10-CM | POA: Diagnosis present

## 2013-06-02 DIAGNOSIS — G9340 Encephalopathy, unspecified: Secondary | ICD-10-CM

## 2013-06-02 DIAGNOSIS — I10 Essential (primary) hypertension: Secondary | ICD-10-CM | POA: Diagnosis present

## 2013-06-02 DIAGNOSIS — E860 Dehydration: Secondary | ICD-10-CM

## 2013-06-02 DIAGNOSIS — K7290 Hepatic failure, unspecified without coma: Principal | ICD-10-CM

## 2013-06-02 DIAGNOSIS — R188 Other ascites: Secondary | ICD-10-CM

## 2013-06-02 DIAGNOSIS — E871 Hypo-osmolality and hyponatremia: Secondary | ICD-10-CM | POA: Diagnosis present

## 2013-06-02 DIAGNOSIS — K7682 Hepatic encephalopathy: Principal | ICD-10-CM | POA: Diagnosis present

## 2013-06-02 DIAGNOSIS — N179 Acute kidney failure, unspecified: Secondary | ICD-10-CM | POA: Diagnosis present

## 2013-06-02 DIAGNOSIS — K7689 Other specified diseases of liver: Secondary | ICD-10-CM | POA: Diagnosis present

## 2013-06-02 DIAGNOSIS — E869 Volume depletion, unspecified: Secondary | ICD-10-CM | POA: Diagnosis present

## 2013-06-02 DIAGNOSIS — R0602 Shortness of breath: Secondary | ICD-10-CM

## 2013-06-02 DIAGNOSIS — N189 Chronic kidney disease, unspecified: Secondary | ICD-10-CM

## 2013-06-02 DIAGNOSIS — Z6826 Body mass index (BMI) 26.0-26.9, adult: Secondary | ICD-10-CM

## 2013-06-02 DIAGNOSIS — Z833 Family history of diabetes mellitus: Secondary | ICD-10-CM

## 2013-06-02 DIAGNOSIS — K7460 Unspecified cirrhosis of liver: Secondary | ICD-10-CM | POA: Diagnosis present

## 2013-06-02 DIAGNOSIS — J9600 Acute respiratory failure, unspecified whether with hypoxia or hypercapnia: Secondary | ICD-10-CM

## 2013-06-02 DIAGNOSIS — E43 Unspecified severe protein-calorie malnutrition: Secondary | ICD-10-CM | POA: Diagnosis present

## 2013-06-02 DIAGNOSIS — K760 Fatty (change of) liver, not elsewhere classified: Secondary | ICD-10-CM

## 2013-06-02 DIAGNOSIS — E119 Type 2 diabetes mellitus without complications: Secondary | ICD-10-CM | POA: Diagnosis present

## 2013-06-02 DIAGNOSIS — E861 Hypovolemia: Secondary | ICD-10-CM | POA: Diagnosis present

## 2013-06-02 LAB — COMPREHENSIVE METABOLIC PANEL
ALT: 36 U/L (ref 0–53)
Albumin: 2.8 g/dL — ABNORMAL LOW (ref 3.5–5.2)
Alkaline Phosphatase: 143 U/L — ABNORMAL HIGH (ref 39–117)
BUN: 59 mg/dL — ABNORMAL HIGH (ref 6–23)
CO2: 21 mEq/L (ref 19–32)
Chloride: 78 mEq/L — ABNORMAL LOW (ref 96–112)
Creatinine, Ser: 1.62 mg/dL — ABNORMAL HIGH (ref 0.50–1.35)
GFR calc Af Amer: 47 mL/min — ABNORMAL LOW (ref >90)
Glucose, Bld: 242 mg/dL — ABNORMAL HIGH (ref 70–99)
Potassium: 5.4 mEq/L — ABNORMAL HIGH (ref 3.5–5.1)
Total Bilirubin: 1.9 mg/dL — ABNORMAL HIGH (ref 0.3–1.2)

## 2013-06-02 LAB — URINALYSIS, ROUTINE W REFLEX MICROSCOPIC
Glucose, UA: NEGATIVE mg/dL
Ketones, ur: NEGATIVE mg/dL
pH: 5.5 (ref 5.0–8.0)

## 2013-06-02 LAB — CBC WITH DIFFERENTIAL/PLATELET
Eosinophils Absolute: 0 10*3/uL (ref 0.0–0.7)
Eosinophils Relative: 0 % (ref 0–5)
Hemoglobin: 15.8 g/dL (ref 13.0–17.0)
Lymphs Abs: 1.6 10*3/uL (ref 0.7–4.0)
MCH: 32.2 pg (ref 26.0–34.0)
MCV: 87.2 fL (ref 78.0–100.0)
Monocytes Absolute: 1.6 10*3/uL — ABNORMAL HIGH (ref 0.1–1.0)
Monocytes Relative: 10 % (ref 3–12)
Platelets: 289 10*3/uL (ref 150–400)
RBC: 4.91 MIL/uL (ref 4.22–5.81)

## 2013-06-02 LAB — URINE MICROSCOPIC-ADD ON

## 2013-06-02 LAB — GLUCOSE, CAPILLARY
Glucose-Capillary: 223 mg/dL — ABNORMAL HIGH (ref 70–99)
Glucose-Capillary: 251 mg/dL — ABNORMAL HIGH (ref 70–99)

## 2013-06-02 LAB — HEMOGLOBIN A1C
Hgb A1c MFr Bld: 7.1 % — ABNORMAL HIGH (ref <5.7)
Mean Plasma Glucose: 157 mg/dL — ABNORMAL HIGH (ref <117)

## 2013-06-02 LAB — TSH: TSH: 1.797 u[IU]/mL (ref 0.350–4.500)

## 2013-06-02 MED ORDER — ONDANSETRON HCL 4 MG/2ML IJ SOLN
4.0000 mg | Freq: Four times a day (QID) | INTRAMUSCULAR | Status: DC | PRN
  Administered 2013-06-02: 4 mg via INTRAVENOUS

## 2013-06-02 MED ORDER — SODIUM CHLORIDE 0.9 % IV SOLN
INTRAVENOUS | Status: DC
  Administered 2013-06-02 – 2013-06-03 (×2): via INTRAVENOUS

## 2013-06-02 MED ORDER — SODIUM CHLORIDE 0.9 % IV SOLN
Freq: Once | INTRAVENOUS | Status: AC
  Administered 2013-06-02: 06:00:00 via INTRAVENOUS

## 2013-06-02 MED ORDER — INSULIN ASPART 100 UNIT/ML ~~LOC~~ SOLN
0.0000 [IU] | SUBCUTANEOUS | Status: DC
  Administered 2013-06-02 (×2): 3 [IU] via SUBCUTANEOUS
  Administered 2013-06-02: 17:00:00 via SUBCUTANEOUS
  Administered 2013-06-02: 12:00:00 5 [IU] via SUBCUTANEOUS
  Administered 2013-06-02: 3 [IU] via SUBCUTANEOUS
  Administered 2013-06-03: 05:00:00 1 [IU] via SUBCUTANEOUS
  Administered 2013-06-03: 2 [IU] via SUBCUTANEOUS
  Administered 2013-06-03: 1 [IU] via SUBCUTANEOUS

## 2013-06-02 MED ORDER — PANTOPRAZOLE SODIUM 40 MG PO TBEC
40.0000 mg | DELAYED_RELEASE_TABLET | Freq: Every day | ORAL | Status: DC
  Administered 2013-06-02 – 2013-06-03 (×2): 40 mg via ORAL
  Filled 2013-06-02 (×2): qty 1

## 2013-06-02 MED ORDER — LACTULOSE 10 GM/15ML PO SOLN
20.0000 g | Freq: Three times a day (TID) | ORAL | Status: DC
  Administered 2013-06-02 – 2013-06-03 (×4): 20 g via ORAL
  Filled 2013-06-02 (×6): qty 30

## 2013-06-02 MED ORDER — ONDANSETRON HCL 4 MG/2ML IJ SOLN
INTRAMUSCULAR | Status: AC
  Filled 2013-06-02: qty 2

## 2013-06-02 MED ORDER — SODIUM CHLORIDE 0.9 % IV SOLN
Freq: Once | INTRAVENOUS | Status: AC
  Administered 2013-06-02: 20 mL via INTRAVENOUS

## 2013-06-02 MED ORDER — SODIUM CHLORIDE 0.9 % IV BOLUS (SEPSIS): 1000.0000 mL | Freq: Once | INTRAVENOUS | Status: DC

## 2013-06-02 MED ORDER — ALBUMIN HUMAN 25 % IV SOLN
25.0000 g | Freq: Four times a day (QID) | INTRAVENOUS | Status: DC
  Administered 2013-06-02 – 2013-06-03 (×6): 25 g via INTRAVENOUS
  Filled 2013-06-02 (×12): qty 100

## 2013-06-02 MED ORDER — ONDANSETRON HCL 4 MG PO TABS: 4.0000 mg | ORAL_TABLET | Freq: Four times a day (QID) | ORAL | Status: DC | PRN

## 2013-06-02 MED ORDER — GLUCERNA SHAKE PO LIQD
237.0000 mL | Freq: Two times a day (BID) | ORAL | Status: DC
  Administered 2013-06-03: 237 mL via ORAL
  Filled 2013-06-02 (×2): qty 237

## 2013-06-02 MED ORDER — BOOST PLUS PO LIQD
237.0000 mL | ORAL | Status: DC
  Filled 2013-06-02 (×2): qty 237

## 2013-06-02 MED ORDER — SODIUM CHLORIDE 0.9 % IJ SOLN: 3.0000 mL | Freq: Two times a day (BID) | INTRAMUSCULAR | Status: DC

## 2013-06-02 MED ORDER — LACTULOSE 10 GM/15ML PO SOLN
30.0000 g | Freq: Once | ORAL | Status: AC
  Administered 2013-06-02: 30 g via ORAL
  Filled 2013-06-02: qty 45

## 2013-06-02 NOTE — H&P (Signed)
[facility] History and Physical  Jerry [name] ZOX:[id number] DOB: [birth year]    PCP:   Thora Lance, MD   Chief Complaint: confusion and weakness.  HPI: Jerry [name] is an 72 y.o. male with hx of NASH, with significant ascites, DM, HTN, since Oct had required LVP of 7-8 liters at a time, last LVP was reportedly done yesterday in the ER of about 7L, followed by albumin (per family and MLP, but note was not completed at this time.), with normal coag studies, brought to the ER by family as he has been more confused, weak and lethargic, and was not able to get out of bed.  He has some nausea, but no vomiting.  He denied any abdominal pain, fever or chills.  Evalaution in the ER showed AKI, with Cr of 1.6, BUN 59, Na of 117, and K of 5.4.  He has an albumin of 2.7, with Hb of 15.8 grams per dL.  His head CT was negative, and his NH3 level is 120.  Hospitalist was asked to admit him for dehydration, hepatic encephalopathy, likely because of aggressive diuresis and intravascular depletion.    Rewiew of Systems:  Constitutional: Negative for malaise, fever and chills. No significant weight loss or weight gain Eyes: Negative for eye pain, redness and discharge, diplopia, visual changes, or flashes of light. ENMT: Negative for ear pain, hoarseness, nasal congestion, sinus pressure and sore throat. No headaches; tinnitus, drooling, or problem swallowing. Cardiovascular: Negative for chest pain, palpitations, diaphoresis, dyspnea and peripheral edema. ; No orthopnea, PND Respiratory: Negative for cough, hemoptysis, wheezing and stridor. No pleuritic chestpain. Gastrointestinal: Negative for nausea, vomiting, diarrhea, constipation, abdominal pain, melena, blood in stool, hematemesis, jaundice and rectal bleeding.    Genitourinary: Negative for frequency, dysuria, incontinence,flank pain and hematuria; Musculoskeletal: Negative for back pain and neck pain. Negative for swelling and trauma.;   Skin: . Negative for pruritus, rash, abrasions, bruising and skin lesion.; ulcerations Neuro: Negative for headache, lightheadedness and neck stiffness. Negative for weakness, altered level of consciousness , burning feet, involuntary movement, seizure and syncope.  Psych: negative for anxiety, depression, insomnia, tearfulness, panic attacks, hallucinations, paranoia, suicidal or homicidal ideation    Past Medical History  Diagnosis Date  . Diabetes mellitus without complication   . Hypertension   . Cirrhosis, non-alcoholic     October 2014 diagnosed    Past Surgical History  Procedure Laterality Date  . Back surgery    . Hernia repair      Medications:  HOME MEDS: Prior to Admission medications   Medication Sig Start Date End Date Taking? Authorizing Provider  esomeprazole (NEXIUM) 20 MG capsule Take 1 capsule (20 mg total) by mouth every morning. 05/17/13  Yes Catarina Hartshorn, MD  omeprazole-sodium bicarbonate (ZEGERID) 40-1100 MG per capsule Take 1 capsule by mouth every evening.  04/01/13  Yes Historical Provider, MD  sitaGLIPtin (JANUVIA) 50 MG tablet Take 1 tablet (50 mg total) by mouth daily. 05/17/13  Yes Catarina Hartshorn, MD     Allergies:  No Known Allergies  Social History:   reports that he has quit smoking. He does not have any smokeless tobacco history on file. He reports that he does not drink alcohol or use illicit drugs.  Family History: Family History  Problem Relation Age of Onset  . Diabetes Mellitus I Mother   . Congestive Heart Failure Mother      Physical Exam: Filed Vitals:   06/02/13 0239 06/02/13 0356 06/02/13 0522  BP: 118/95 126/59 109/69  Pulse: 99 98 94  Temp: 98.7 F (37.1 C)    TempSrc: Oral    Resp: 20 18 17   SpO2: 99% 95% 96%   Blood pressure 109/69, pulse 94, temperature 98.7 F (37.1 C), temperature source Oral, resp. rate 17, SpO2 96.00%.  GEN:  Pleasant  patient lying in the stretcher in no acute distress; cooperative with  exam. PSYCH:  alert and oriented x4; does not appear anxious or depressed; affect is appropriate. HEENT: Mucous membranes pink and anicteric; PERRLA; EOM intact; no cervical lymphadenopathy nor thyromegaly or carotid bruit; no JVD; There were no stridor. Neck is very supple. Breasts:: Not examined CHEST WALL: No tenderness CHEST: Normal respiration, clear to auscultation bilaterally.  HEART: Regular rate and rhythm.  There are no murmur, rub, or gallops.   BACK: No kyphosis or scoliosis; no CVA tenderness ABDOMEN: soft and non-tender; no masses, no organomegaly, normal abdominal bowel sounds; no pannus; no intertriginous candida. There is no rebound and no distention. Rectal Exam: Not done EXTREMITIES: No bone or joint deformity; age-appropriate arthropathy of the hands and knees; no edema; no ulcerations.  There is no calf tenderness. Genitalia: not examined PULSES: 2+ and symmetric SKIN: Normal hydration no rash or ulceration.  No excessive bruising. CNS: Cranial nerves 2-12 grossly intact no focal lateralizing neurologic deficit.  Speech is fluent; uvula elevated with phonation, facial symmetry and tongue midline. DTR are normal bilaterally, cerebella exam is intact, barbinski is negative and strengths are equaled bilaterally.  No sensory loss. He does have mild asterexis.   Labs on Admission:  Basic Metabolic Panel:  Recent Labs Lab 06/02/13 0355  NA 117*  K 5.4*  CL 78*  CO2 21  GLUCOSE 242*  BUN 59*  CREATININE 1.62*  CALCIUM 9.0   Liver Function Tests:  Recent Labs Lab 06/02/13 0355  AST 35  ALT 36  ALKPHOS 143*  BILITOT 1.9*  PROT 6.9  ALBUMIN 2.8*   No results found for this basename: LIPASE, AMYLASE,  in the last 168 hours  Recent Labs Lab 06/02/13 0355  AMMONIA 123*   CBC:  Recent Labs Lab 06/02/13 0300  WBC 16.8*  NEUTROABS 13.5*  HGB 15.8  HCT 42.8  MCV 87.2  PLT 289   Cardiac Enzymes: No results found for this basename: CKTOTAL, CKMB,  CKMBINDEX, TROPONINI,  in the last 168 hours  CBG: No results found for this basename: GLUCAP,  in the last 168 hours   Radiological Exams on Admission: Dg Chest 2 View  06/02/2013   CLINICAL DATA:  Altered mental still  EXAM: CHEST  2 VIEW  COMPARISON:  Chest x-ray from yesterday  FINDINGS: Low lung volumes persist, with bibasilar atelectasis. No edema, pneumothorax, or consolidation. Borderline cardiomegaly.  IMPRESSION: Persistently low lung volumes, with basilar atelectasis.   Electronically Signed   By: Tiburcio Pea M.D.   On: 06/02/2013 03:55   Dg Chest 2 View  06/01/2013   CLINICAL DATA:  Cough, dyspnea  EXAM: CHEST  2 VIEW  COMPARISON:  04/06/2013.  FINDINGS: Low lung volumes. Areas of increased density project within the lung bases. The osseous structures are unremarkable. The cardiac silhouette is obscured.  IMPRESSION: Low lung volumes. Atelectasis versus infiltrate within the lung bases. Repeat evaluation with deeper inspiration recommended.   Electronically Signed   By: Salome Holmes M.D.   On: 06/01/2013 12:44   Ct Head Wo Contrast  06/02/2013   CLINICAL DATA:  Expressive aphasia  EXAM: CT HEAD WITHOUT CONTRAST  TECHNIQUE: Contiguous  axial images were obtained from the base of the skull through the vertex without intravenous contrast.  COMPARISON:  10/02/2010  FINDINGS: Skull and Sinuses:No significant abnormality.  Orbits: No acute abnormality.  Brain: No evidence of acute abnormality, such as acute infarction, hemorrhage, hydrocephalus, or mass lesion/mass effect. Age appropriate cerebral volume loss.  IMPRESSION: No evidence of acute intracranial disease.   Electronically Signed   By: Tiburcio Pea M.D.   On: 06/02/2013 04:07    Assessment/Plan Present on Admission:  . Intravascular volume depletion . Cirrhosis of liver not due to alcohol . Hyponatremia . NAFLD (nonalcoholic fatty liver disease) . Diabetes mellitus, type II . AKI (acute kidney injury) . Hepatic  encephalopathy  PLAN: Though the history indicates that he does build up his ascites rather quickly, I suspect at this time, he is volume depleted.  This is especially true of his intravascular volume.  Since is is not anemic, will not give blood, though that would be an ideal intravascular volume expander.  I will give him NS at 75 cc per hour, along with Albumin 12 %, 25 grams every 6 hours.  Please follow his renal fx.  I don't think he is developing HRS (hepato-renal syndrome).  His Na will be correct with IV normal saline.  Once his intravascular volume is better, he would need fluid restriction again.  For his DM, will give sensitive SSI, but hold his oral meds for now, and place him on carb modified diet.  His confusion is likely from hepatic encephalopathy, and will start lactulose on him.  He is able to take it orally at this time.  There is no evidence of infection. He is stable, full code ( confirmed) and will be admitted to [facility] service.  Thank you for allowing me to participate in his care.  Other plans as per orders.  Code Status: FULL Unk Lightning, MD. [facility] Pager [phone] 7pm to 7am.  06/02/2013, 6:04 AM

## 2013-06-02 NOTE — ED Notes (Signed)
Patient medicated for nausea, see MAR

## 2013-06-02 NOTE — ED Notes (Signed)
Pt arrived to the ED with a complaint of emesis that is produced from a cough.  Pt's family states the pt is also having some altered mental status.  Pt was seen here yesterday and underwent paracentesis.  Dr Juleen China removed 7 liters of fluid according to family.  Pt also give albumin.  Pt has had present symptoms since returning home.

## 2013-06-02 NOTE — Progress Notes (Addendum)
Courtesy visit to patient and wife from [facility].  Pt has been approved for eligibility to [facility] services.  Admission was scheduled for today and patient ended up admitted to hospital.  Spoke with wife at length and discussed DME she needed at home.  Package A with hospital bed, 1/2 rails, OBT, BSC, APP Mattress, light weight wheel chair with foot rests no shower chair - all package info given to [facility]  12/16 to process the order.  Wife unsure of when pt to be discharged.  Advised wife to call [facility] @ [phone] and let us know when pt scheduled for discharge and we will be in touch with her for scheduling admission.  Please call [phone] with any questions.  Chalmers Cater RN Liaison [facility]

## 2013-06-02 NOTE — ED Notes (Signed)
Patient taken off floor for CXR and CT scan Patient in NAD

## 2013-06-02 NOTE — H&P (Signed)
Patient seen and evaluated earlier this AM by my associate. Please refer to his H and P regarding his Assessment and Plan.  Will plan on following up next am or sooner should any new concerns arise.  Cedrick Partain, [employer]

## 2013-06-02 NOTE — Progress Notes (Signed)
INITIAL NUTRITION ASSESSMENT  DOCUMENTATION CODES Per approved criteria  -Severe malnutrition in the context of chronic illness  Pt meets criteria for SEVERE MALNUTRITION in the context of CHRONIC ILLNESS as evidenced by 18% weight loss in less than 2 months and estimated energy intake <75% of estimated energy needs for > 1 month.   INTERVENTION: Provide Glucerna Shake BID Provide Boost Plus once daily Provide a snack once daily Encourage PO intake  NUTRITION DIAGNOSIS: Inadequate oral intake related to poor appetite as evidenced by 18% weight loss in less than 2 months.   Goal: Pt to meet >/= 90% of their estimated nutrition needs   Monitor:  PO intake Weight Labs  Reason for Assessment: Malnutrition Screening Tool, score of 3  72 y.o. male  Admitting Dx: Hepatic encephalopathy  ASSESSMENT: 72 y.o. male with hx of NASH, with significant ascites, DM, HTN, brought to the ER by family as he has been more confused, weak and lethargic, and was not able to get out of bed. He has some nausea, but no vomiting. Evalaution in the ER showed AKI, with Cr of 1.6, BUN 59, Na of 117, and K of 5.4. He has an albumin of 2.7, with Hb of 15.8 grams per dL. Hospitalist was asked to admit him for dehydration, hepatic encephalopathy, likely because of aggressive diuresis and intravascular depletion.   Pt lethargic at time of visit and falling asleep at time of visit; pt's wife at bedside assisted pt in answering questions. Pt reports having a poor appetite for the past couple months and eating poorly. Per wife pt's usual body weight is 220 lbs, pt just recently started drinking Boost Plus twice daily due to recent rapid weight loss. Per wife, PTA pt had been eating breakfast and lunch daily with one snack in between; he follows a low sodium, low carb diet. Pt states he ate well today, he ate about 75% of his lunch. Weight history shows that pt has lost 18% of his body weight within the past 2 months.   Pt asleep by end of assessment. Spoke with pt's wife and encouraged that pt continue drinking Boost and to snack as much as tolerated.    Height: Ht Readings from Last 1 Encounters:  06/02/13 5\' 9"  (1.753 m)    Weight: Wt Readings from Last 1 Encounters:  06/02/13 178 lb 12.7 oz (81.1 kg)    Ideal Body Weight: 160 lbs  % Ideal Body Weight: 111%  Wt Readings from Last 10 Encounters:  06/02/13 178 lb 12.7 oz (81.1 kg)  05/14/13 203 lb 0.7 oz (92.1 kg)  04/09/13 217 lb 1.6 oz (98.476 kg)    Usual Body Weight: 220 lbs  % Usual Body Weight: 81%  BMI:  Body mass index is 26.39 kg/(m^2).  Estimated Nutritional Needs: Kcal: 2000-2200 Protein: 95-105 grams Fluid: 2-2.2 L/day  Skin: WDL  Diet Order: Carb Control  EDUCATION NEEDS: -No education needs identified at this time   Intake/Output Summary (Last 24 hours) at 06/02/13 1640 Last data filed at 06/02/13 1500  Gross per 24 hour  Intake  782.5 ml  Output      0 ml  Net  782.5 ml    Last BM: 12/15   Labs:   Recent Labs Lab 06/02/13 0355  NA 117*  K 5.4*  CL 78*  CO2 21  BUN 59*  CREATININE 1.62*  CALCIUM 9.0  GLUCOSE 242*    CBG (last 3)   Recent Labs  06/02/13 0735 06/02/13 1153  06/02/13 1608  GLUCAP 223* 251* 204*    Scheduled Meds: . albumin human  25 g Intravenous Q6H  . insulin aspart  0-9 Units Subcutaneous Q4H  . lactulose  20 g Oral TID  . ondansetron      . pantoprazole  40 mg Oral Daily  . sodium chloride  3 mL Intravenous Q12H    Continuous Infusions: . sodium chloride 75 mL/hr at 06/02/13 0730    Past Medical History  Diagnosis Date  . Diabetes mellitus without complication   . Hypertension   . Cirrhosis, non-alcoholic     October 2014 diagnosed    Past Surgical History  Procedure Laterality Date  . Back surgery    . Hernia repair      Ian Malkin RD, LDN Inpatient Clinical Dietitian Pager: [phone] After Hours Pager: [phone]

## 2013-06-02 NOTE — Progress Notes (Signed)
Inpatient Diabetes Program Recommendations  AACE/ADA: New Consensus Statement on Inpatient Glycemic Control (2013)  Target Ranges:  Prepandial:   less than 140 mg/dL      Peak postprandial:   less than 180 mg/dL (1-2 hours)      Critically ill patients:  140 - 180 mg/dL    Results for ELMORE, HYSLOP (MRN [id number]) as of 06/02/2013 12:04  Ref. Range 06/02/2013 07:35 06/02/2013 11:53  Glucose-Capillary Latest Range: 70-99 mg/dL 086 (H) 578 (H)   Inpatient Diabetes Program Recommendations Correction (SSI): Please consider increasing Novolog correction to moderate scale Q4H.  Thanks, Orlando Penner, RN, MSN, CCRN Diabetes Coordinator Inpatient Diabetes Program [phone] (Team Pager) [phone] ([facility] office) [phone] [facility] office)

## 2013-06-02 NOTE — ED Notes (Signed)
Per patient's family, patient "not right" since 2300 last night 12/14, unable to fully verbalize needs, confused Patient unable to answer orientation questions PERRLA, tongue midline and grips equal bilaterally Dr. Anitra Lauth called and made aware, NP at bedside

## 2013-06-02 NOTE — ED Notes (Signed)
Report given to Kelly, RN. All questions answered.

## 2013-06-02 NOTE — ED Notes (Signed)
Albumin given, see MAR 2nd check provided by Elmarie Shiley, RN ED Charge Nurse Patient and pt's family deny further needs at this time Side rails up, call bell in reach and awaiting bed placement

## 2013-06-02 NOTE — ED Notes (Signed)
Bladder scan: 56 ml Will inform NP

## 2013-06-02 NOTE — ED Notes (Signed)
CRITICAL VALUE ALERT  Critical value received:  Na+ 117  Date of notification:  06/02/2013  Time of notification:  0458  Critical value read back:yes  Nurse who received alert: Margaretann Loveless, RN

## 2013-06-02 NOTE — ED Notes (Signed)
Patient to be admitted Dr. Conley Rolls at bedside

## 2013-06-02 NOTE — ED Provider Notes (Signed)
CSN: [id number]     Arrival date & time 06/02/13  2440 History   First MD Initiated Contact with Patient 06/02/13 0257     Chief Complaint  Patient presents with  . Emesis  . Altered Mental Status   (Consider location/radiation/quality/duration/timing/severity/associated sxs/prior Treatment) HPI  Past Medical History  Diagnosis Date  . Diabetes mellitus without complication   . Hypertension   . 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 Back surgery    . Hernia repair     Family History  Problem Relation Age of Onset  . Diabetes Mellitus I Mother   . Congestive Heart Failure Mother    History  Substance Use Topics  . Smoking status: Former Games developer  . Smokeless tobacco: Not on file  . Alcohol Use: No    Review of Systems  Allergies  Review of patient's allergies indicates no known allergies.  Home Medications   Current Outpatient Rx  Name  Route  Sig  Dispense  Refill  . esomeprazole (NEXIUM) 20 MG capsule   Oral   Take 1 capsule (20 mg total) by mouth every morning.   30 capsule   1   . omeprazole-sodium bicarbonate (ZEGERID) 40-1100 MG per capsule   Oral   Take 1 capsule by mouth every evening.          . sitaGLIPtin (JANUVIA) 50 MG tablet   Oral   Take 1 tablet (50 mg total) by mouth daily.   30 tablet   1    BP 126/59  Pulse 98  Temp(Src) 98.7 F (37.1 C) (Oral)  Resp 18  SpO2 95% Physical Exam  ED Course  Procedures (including critical care time) Labs Review Labs Reviewed  CBC WITH DIFFERENTIAL - Abnormal; Notable for the following:    WBC 16.8 (*)    MCHC 36.9 (*)    RDW 15.7 (*)    Neutrophils Relative % 81 (*)    Neutro Abs 13.5 (*)    Lymphocytes Relative 9 (*)    Monocytes Absolute 1.6 (*)    All other components within normal limits  AMMONIA - Abnormal; Notable for the following:    Ammonia 123 (*)    All other components within normal limits  LACTIC ACID,  PLASMA - Abnormal; Notable for the following:    Lactic Acid, Venous 6.5 (*)    All other components within normal limits  COMPREHENSIVE METABOLIC PANEL - Abnormal; Notable for the following:    Sodium 117 (*)    Potassium 5.4 (*)    Chloride 78 (*)    Glucose, Bld 242 (*)    BUN 59 (*)    Creatinine, Ser 1.62 (*)    Albumin 2.8 (*)    Alkaline Phosphatase 143 (*)    Total Bilirubin 1.9 (*)    GFR calc non Af Amer 40 (*)    GFR calc Af Amer 47 (*)    All other components within normal limits  URINALYSIS, ROUTINE W REFLEX MICROSCOPIC   Imaging Review Dg Chest 2 View  06/02/2013   CLINICAL DATA:  Altered mental still  EXAM: CHEST  2 VIEW  COMPARISON:  Chest x-ray from yesterday  FINDINGS: Low lung volumes persist, with bibasilar atelectasis. No edema, pneumothorax, or consolidation. Borderline cardiomegaly.  IMPRESSION: Persistently low lung volumes, with basilar atelectasis.   Electronically Signed   By: Tiburcio Pea M.D.   On: 06/02/2013 03:55   Dg  Chest 2 View  06/01/2013   CLINICAL DATA:  Cough, dyspnea  EXAM: CHEST  2 VIEW  COMPARISON:  04/06/2013.  FINDINGS: Low lung volumes. Areas of increased density project within the lung bases. The osseous structures are unremarkable. The cardiac silhouette is obscured.  IMPRESSION: Low lung volumes. Atelectasis versus infiltrate within the lung bases. Repeat evaluation with deeper inspiration recommended.   Electronically Signed   By: Salome Holmes M.D.   On: 06/01/2013 12:44   Ct Head Wo Contrast  06/02/2013   CLINICAL DATA:  Expressive aphasia  EXAM: CT HEAD WITHOUT CONTRAST  TECHNIQUE: Contiguous axial images were obtained from the base of the skull through the vertex without intravenous contrast.  COMPARISON:  10/02/2010  FINDINGS: Skull and Sinuses:No significant abnormality.  Orbits: No acute abnormality.  Brain: No evidence of acute abnormality, such as acute infarction, hemorrhage, hydrocephalus, or mass lesion/mass effect. Age  appropriate cerebral volume loss.  IMPRESSION: No evidence of acute intracranial disease.   Electronically Signed   By: Tiburcio Pea M.D.   On: 06/02/2013 04:07    EKG Interpretation   None       MDM   1. Dehydration, severe   2. Encephalopathy acute        Arman Filter, NP 06/02/13 0530

## 2013-06-02 NOTE — ED Notes (Signed)
Patient unable to provide urine specimen In and Out cath attempted without success--could not advance catheter for urine specimen Patient's wife reports same issues during last ED visit and that UA wasn't able to be collected  NP made aware Bladder scan in progress

## 2013-06-02 NOTE — ED Provider Notes (Signed)
CSN: [id number]     Arrival date & time 06/02/13  4098 History   First MD Initiated Contact with Patient 06/02/13 0257     Chief Complaint  Patient presents with  . Emesis  . Altered Mental Status   (Consider location/radiation/quality/duration/timing/severity/associated sxs/prior Treatment) HPI Comments: Patient's family state that at 11:00 his  Speech became garbled and was  unable to complete his sentences this is new for this patient.  He had a paracentesis for nonalcoholic cirrhosis and ascites yesterday  he states since that time has not been "himself. also stated that about one week ago he stopped walking.  They deny any fever falls patient denies blurry vision headache pain  but he is speaking in full sentences at this time.   Patient is a 72 y.o. male presenting with vomiting and altered mental status. The history is provided by the patient.  Emesis Severity:  Moderate Duration:  4 hours Timing:  Intermittent Quality:  Bilious material Progression:  Improving Chronicity:  New Relieved by:  None tried Worsened by:  Nothing tried Associated symptoms: no abdominal pain, no chills and no headaches   Altered Mental Status Associated symptoms: vomiting   Associated symptoms: no abdominal pain, no fever and no headaches     Past Medical History  Diagnosis Date  . Diabetes mellitus without complication   . Hypertension   . 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 Back surgery    . Hernia repair     Family History  Problem Relation Age of Onset  . Diabetes Mellitus I Mother   . Congestive Heart Failure Mother    History  Substance Use Topics  . Smoking status: Former Games developer  . Smokeless tobacco: Not on file  . Alcohol Use: No    Review of Systems  Constitutional: Negative for fever and chills.  Eyes: Negative for visual disturbance.  Cardiovascular: Negative for leg swelling.  Gastrointestinal:  Positive for vomiting and abdominal distention. Negative for abdominal pain.  Skin: Positive for pallor.  Neurological: Negative for dizziness and headaches.  All other systems reviewed and are negative.    Allergies  Review of patient's allergies indicates no known allergies.  Home Medications   Current Outpatient Rx  Name  Route  Sig  Dispense  Refill  . esomeprazole (NEXIUM) 20 MG capsule   Oral   Take 1 capsule (20 mg total) by mouth every morning.   30 capsule   1   . omeprazole-sodium bicarbonate (ZEGERID) 40-1100 MG per capsule   Oral   Take 1 capsule by mouth every evening.          . sitaGLIPtin (JANUVIA) 50 MG tablet   Oral   Take 1 tablet (50 mg total) by mouth daily.   30 tablet   1    BP 126/59  Pulse 98  Temp(Src) 98.7 F (37.1 C) (Oral)  Resp 18  SpO2 95% Physical Exam  Constitutional: He is oriented to person, place, and time. He appears well-developed and well-nourished. No distress.  HENT:  Head: Normocephalic and atraumatic.  Right Ear: External ear normal.  Left Ear: External ear normal.  Eyes: Pupils are equal, round, and reactive to light.  Neck: Normal range of motion.  Cardiovascular: Normal rate and regular rhythm.   Pulmonary/Chest: Effort normal and breath sounds normal.  Abdominal: Soft. Bowel sounds are normal. He exhibits no distension.  Musculoskeletal: Normal range of motion. He  exhibits no edema and no tenderness.  Neurological: He is alert and oriented to person, place, and time.  Skin: Skin is warm.  Abrasions on right knee left elbow    ED Course  Procedures (including critical care time) Labs Review Labs Reviewed  CBC WITH DIFFERENTIAL - Abnormal; Notable for the following:    WBC 16.8 (*)    MCHC 36.9 (*)    RDW 15.7 (*)    Neutrophils Relative % 81 (*)    Neutro Abs 13.5 (*)    Lymphocytes Relative 9 (*)    Monocytes Absolute 1.6 (*)    All other components within normal limits  AMMONIA - Abnormal; Notable for  the following:    Ammonia 123 (*)    All other components within normal limits  LACTIC ACID, PLASMA - Abnormal; Notable for the following:    Lactic Acid, Venous 6.5 (*)    All other components within normal limits  COMPREHENSIVE METABOLIC PANEL - Abnormal; Notable for the following:    Sodium 117 (*)    Potassium 5.4 (*)    Chloride 78 (*)    Glucose, Bld 242 (*)    BUN 59 (*)    Creatinine, Ser 1.62 (*)    Albumin 2.8 (*)    Alkaline Phosphatase 143 (*)    Total Bilirubin 1.9 (*)    GFR calc non Af Amer 40 (*)    GFR calc Af Amer 47 (*)    All other components within normal limits  URINALYSIS, ROUTINE W REFLEX MICROSCOPIC   Imaging Review Dg Chest 2 View  06/02/2013   CLINICAL DATA:  Altered mental still  EXAM: CHEST  2 VIEW  COMPARISON:  Chest x-ray from yesterday  FINDINGS: Low lung volumes persist, with bibasilar atelectasis. No edema, pneumothorax, or consolidation. Borderline cardiomegaly.  IMPRESSION: Persistently low lung volumes, with basilar atelectasis.   Electronically Signed   By: Tiburcio Pea M.D.   On: 06/02/2013 03:55   Dg Chest 2 View  06/01/2013   CLINICAL DATA:  Cough, dyspnea  EXAM: CHEST  2 VIEW  COMPARISON:  04/06/2013.  FINDINGS: Low lung volumes. Areas of increased density project within the lung bases. The osseous structures are unremarkable. The cardiac silhouette is obscured.  IMPRESSION: Low lung volumes. Atelectasis versus infiltrate within the lung bases. Repeat evaluation with deeper inspiration recommended.   Electronically Signed   By: Salome Holmes M.D.   On: 06/01/2013 12:44   Ct Head Wo Contrast  06/02/2013   CLINICAL DATA:  Expressive aphasia  EXAM: CT HEAD WITHOUT CONTRAST  TECHNIQUE: Contiguous axial images were obtained from the base of the skull through the vertex without intravenous contrast.  COMPARISON:  10/02/2010  FINDINGS: Skull and Sinuses:No significant abnormality.  Orbits: No acute abnormality.  Brain: No evidence of acute  abnormality, such as acute infarction, hemorrhage, hydrocephalus, or mass lesion/mass effect. Age appropriate cerebral volume loss.  IMPRESSION: No evidence of acute intracranial disease.   Electronically Signed   By: Tiburcio Pea M.D.   On: 06/02/2013 04:07    EKG Interpretation   None       MDM   1. Dehydration, severe   2. Encephalopathy acute    Discussed lab finding with family and patient  Will admit for hydration and treatment  And patient's family members and he now did not lose consciousness he has scrapes and bruises on his extremities he does not remember striking his head.   Arman Filter, NP 06/02/13 [id number]

## 2013-06-02 NOTE — Care Management Note (Addendum)
    Page 1 of 2   06/03/2013     3:13:25 PM   CARE MANAGEMENT NOTE 06/03/2013  Patient:  Jerry [name], Jerry [name]   Account Number:  [id number]  Date Initiated:  06/02/2013  Documentation initiated by:  Lanier Clam  Subjective/Objective Assessment:   72 Y/O M ADMITTED W/CONFUSION,WEAKNESS.READMIT-11/25-11/29-HYPOSMOLALITY.     Action/Plan:   FROM HOME W/SPOUSE.HAS RW,CANE,3N1.   Anticipated DC Date:  06/03/2013   Anticipated DC Plan:  HOME W HOSPICE CARE      DC Planning Services  CM consult      Choice offered to / List presented to:  C-1 Patient   DME arranged  HOSPITAL BED  OVERBED TABLE  AIR OVERLAY MATTRESS  3-N-1  WALKER - ROLLING  WHEELCHAIR - MANUAL      DME agency  [facility]     HH arranged  HH-1 RN      [facility] agency  [facility] [place]   Status of service:  Completed, signed off Medicare Important Message given?   (If response is "NO", the following Medicare IM given date fields will be blank) Date Medicare IM given:   Date Additional Medicare IM given:    Discharge Disposition:  HOME W HOSPICE CARE  Per UR Regulation:  Reviewed for med. necessity/level of care/duration of stay  If discussed at Long Length of Stay Meetings, dates discussed:    Comments:  06/03/13 Justice Aguirre RN,BSN NCM [phone] [facility] CHOSEN.[facility] ALREADY FOLLOWING.[facility] DME REP AWARE OF DME NEEDS,FAMILY STATES THEY DO NOT HAVE TO STAY IN HOSPITAL FOR DELIVERY OF EQUIPMENT.FAMILY WILL PROVIDE OWN TRANSP.  06/01/13 Rossanna Spitzley RN,BSN NCM [phone] PATIENT,FAMILY ASKING ABOUT HHC SERVICES.RECOMMEND PT CONS WHEN APPROPRIATE.

## 2013-06-02 NOTE — ED Notes (Signed)
Patient back from CT dept Neuro status remains unchanged Patient is able to speak, but not able to complete sentence or fully form thoughts/answer questions when prompted ED tech at bedside to assist patient with standing to provide urine specimen

## 2013-06-02 NOTE — ED Notes (Signed)
Attempted a in and out catheter and met resistance. Performed bladder scan and it showed 56 ml in the bladder.

## 2013-06-03 DIAGNOSIS — E43 Unspecified severe protein-calorie malnutrition: Secondary | ICD-10-CM | POA: Insufficient documentation

## 2013-06-03 DIAGNOSIS — K7290 Hepatic failure, unspecified without coma: Principal | ICD-10-CM

## 2013-06-03 DIAGNOSIS — K7460 Unspecified cirrhosis of liver: Secondary | ICD-10-CM

## 2013-06-03 DIAGNOSIS — E119 Type 2 diabetes mellitus without complications: Secondary | ICD-10-CM

## 2013-06-03 DIAGNOSIS — G9340 Encephalopathy, unspecified: Secondary | ICD-10-CM

## 2013-06-03 DIAGNOSIS — J9600 Acute respiratory failure, unspecified whether with hypoxia or hypercapnia: Secondary | ICD-10-CM

## 2013-06-03 LAB — CBC
HCT: 35.5 % — ABNORMAL LOW (ref 39.0–52.0)
Hemoglobin: 12.4 g/dL — ABNORMAL LOW (ref 13.0–17.0)
MCH: 31.9 pg (ref 26.0–34.0)
MCHC: 34.9 g/dL (ref 30.0–36.0)
MCV: 91.3 fL (ref 78.0–100.0)
RBC: 3.89 MIL/uL — ABNORMAL LOW (ref 4.22–5.81)
RDW: 16.1 % — ABNORMAL HIGH (ref 11.5–15.5)

## 2013-06-03 LAB — BASIC METABOLIC PANEL
BUN: 49 mg/dL — ABNORMAL HIGH (ref 6–23)
CO2: 23 mEq/L (ref 19–32)
Calcium: 9.1 mg/dL (ref 8.4–10.5)
Chloride: 87 mEq/L — ABNORMAL LOW (ref 96–112)
Creatinine, Ser: 1.43 mg/dL — ABNORMAL HIGH (ref 0.50–1.35)
Glucose, Bld: 214 mg/dL — ABNORMAL HIGH (ref 70–99)
Potassium: 4.2 mEq/L (ref 3.5–5.1)
Sodium: 121 mEq/L — ABNORMAL LOW (ref 135–145)

## 2013-06-03 LAB — GLUCOSE, CAPILLARY
Glucose-Capillary: 140 mg/dL — ABNORMAL HIGH (ref 70–99)
Glucose-Capillary: 183 mg/dL — ABNORMAL HIGH (ref 70–99)

## 2013-06-03 MED ORDER — BOOST PLUS PO LIQD: 237.0000 mL | ORAL | Status: DC

## 2013-06-03 NOTE — Progress Notes (Signed)
Report received from C. Keatts RN, agree with am assessment. 

## 2013-06-03 NOTE — Progress Notes (Signed)
Courtesy visit.  Pt discharging today - order for DME placed by [facility] to [facility].   [facility] aware of late discharge today.  Wife to call [phone] with any needs until staff RN sees pt in home for [facility] admission.  Thank you, Chalmers Cater RN Liaison   [phone] [facility] patient line.

## 2013-06-03 NOTE — Discharge Summary (Signed)
Physician Discharge Summary  Jerry [name] RUE:[id number] DOB: 1941/02/14 DOA: 06/02/2013  PCP: Thora Lance, MD  Admit date: 06/02/2013 Discharge date: 06/03/2013  Time spent: > 35 minutes  Recommendations for Outpatient Follow-up:  1. Discharged home with home health 2. Followup with primary care physician within the next week for evaluation of serum creatinine and recommendations regarding hypoglycemic agents 3. Recommend following up with Dr. Matthias Hughs within the next week patient or family to call and set up appointment  Discharge Diagnoses:  Principal Problem:   Hepatic encephalopathy Active Problems:   Hyponatremia   NAFLD (nonalcoholic fatty liver disease)   Diabetes mellitus, type II   Cirrhosis of liver not due to alcohol   Intravascular volume depletion   AKI (acute kidney injury)   Protein-calorie malnutrition, severe   Discharge Condition: Stay  Diet recommendation: Diabetic diet  Filed Weights   06/02/13 0721 06/03/13 0430  Weight: 81.1 kg (178 lb 12.7 oz) 81.511 kg (179 lb 11.2 oz)    History of present illness:  Patient is a 72 year old with history of NASH with history of ascites requiring frequent paracentesis with most recent one done in the ED one day prior to admission. During that time 7-8 L were removed reportedly. Patient presented the following day with altered mental status. With an elevated ammonia level of 1.3.  Hospital Course:  Principal Problem:   Hepatic encephalopathy - Initially it was thought that patient's intravascular volume was depleted and patient was given IV fluids as well as albumin of which the patient tolerated well and showed improvement in clinical condition with this regimen. - Also improved on lactulose - Patient afebrile and WBC on last check within normal limits off of antibiotic - Family reports patient at baseline and expressing wishes to take patient home.  Active Problems:   Hyponatremia - Most likely secondary  to hypervolemia secondary to chronic cirrhosis    NAFLD (nonalcoholic fatty liver disease)   Diabetes mellitus, type II - Patient to continue diabetic diet and followup with primary care physicians for further evaluation.   Cirrhosis of liver not due to alcohol - Patient to follow up with gastroenterologist upon discharge   Intravascular volume depletion - Please see above   AKI (acute kidney injury) - Improved with IV fluids   Protein-calorie malnutrition, severe - Discharge with prescription for Glucerna  Procedures:  As listed above  Consultations:  None  Discharge Exam: Filed Vitals:   06/03/13 1314  BP: 101/58  Pulse: 81  Temp: 98.1 F (36.7 C)  Resp: 16    General: Pt in NAD, Alert and Awake Cardiovascular: RRR, no MRG Respiratory: CTA BL, no wheezes Abdomen: NT, soft  Discharge Instructions  Discharge Orders   Future Orders Complete By Expires   Call MD for:  severe uncontrolled pain  As directed    Call MD for:  temperature >100.4  As directed    Diet - low sodium heart healthy  As directed    Discharge instructions  As directed    Comments:     Please follow up with your pcp within the next 1 week for recommendations regarding your diabetes.   Follow up with your GI physician in the next week for routine care regarding your GI needs.  Discharge home with hospice.   Increase activity slowly  As directed        Medication List         esomeprazole 20 MG capsule  Commonly known as:  NEXIUM  Take 1 capsule (  20 mg total) by mouth every morning.     lactose free nutrition Liqd  Take 237 mLs by mouth daily.     omeprazole-sodium bicarbonate 40-1100 MG per capsule  Commonly known as:  ZEGERID  Take 1 capsule by mouth every evening.     sitaGLIPtin 50 MG tablet  Commonly known as:  JANUVIA  Take 1 tablet (50 mg total) by mouth daily.       No Known Allergies    The results of significant diagnostics from this hospitalization (including  imaging, microbiology, ancillary and laboratory) are listed below for reference.    Significant Diagnostic Studies: Dg Chest 2 View  06/02/2013   CLINICAL DATA:  Altered mental still  EXAM: CHEST  2 VIEW  COMPARISON:  Chest x-ray from yesterday  FINDINGS: Low lung volumes persist, with bibasilar atelectasis. No edema, pneumothorax, or consolidation. Borderline cardiomegaly.  IMPRESSION: Persistently low lung volumes, with basilar atelectasis.   Electronically Signed   By: Tiburcio Pea M.D.   On: 06/02/2013 03:55   Dg Chest 2 View  06/01/2013   CLINICAL DATA:  Cough, dyspnea  EXAM: CHEST  2 VIEW  COMPARISON:  04/06/2013.  FINDINGS: Low lung volumes. Areas of increased density project within the lung bases. The osseous structures are unremarkable. The cardiac silhouette is obscured.  IMPRESSION: Low lung volumes. Atelectasis versus infiltrate within the lung bases. Repeat evaluation with deeper inspiration recommended.   Electronically Signed   By: Salome Holmes M.D.   On: 06/01/2013 12:44   Ct Head Wo Contrast  06/02/2013   CLINICAL DATA:  Expressive aphasia  EXAM: CT HEAD WITHOUT CONTRAST  TECHNIQUE: Contiguous axial images were obtained from the base of the skull through the vertex without intravenous contrast.  COMPARISON:  10/02/2010  FINDINGS: Skull and Sinuses:No significant abnormality.  Orbits: No acute abnormality.  Brain: No evidence of acute abnormality, such as acute infarction, hemorrhage, hydrocephalus, or mass lesion/mass effect. Age appropriate cerebral volume loss.  IMPRESSION: No evidence of acute intracranial disease.   Electronically Signed   By: Tiburcio Pea M.D.   On: 06/02/2013 04:07   Dg Esophagus  05/08/2013   CLINICAL DATA:  Dysphagia  EXAM: ESOPHOGRAM/BARIUM SWALLOW  TECHNIQUE: Single contrast examination was performed using  thin barium.  COMPARISON:  CT abdomen pelvis of 04/06/2013  FLUOROSCOPY TIME:  2 min 12 seconds  FINDINGS: In view of the clinical question of  aspiration, this study was begun on the lateral projection with a rapid sequence spot films performed. The swallowing mechanism is unremarkable. No aspiration is demonstrated. There are mild to moderate tertiary contractions in the mid and distal esophagus. Within the distal esophagus there is an indentation laterally with irregular margins. 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 A distal esophageal mucosal lesion cannot be excluded. No reflux is seen. A barium pill was given at the end of the study which passed into the stomach without delay.  IMPRESSION: 1. No aspiration. 2. 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 An intraluminal distal esophageal lesion cannot be excluded. 3. Mild to moderate tertiary contractions in the distal esophagus.   Electronically Signed   By: Dwyane Dee M.D.   On: 05/08/2013 15:45   US Renal  05/14/2013   CLINICAL DATA:  History of acute on chronic renal failure. History of hepatic cirrhosis.  EXAM: RENAL/URINARY TRACT ULTRASOUND COMPLETE  COMPARISON:  CT 04/06/2013.  FINDINGS: Right Kidney  Length: Right renal length is 11.1 cm. There is slight increased echogenicity of the parenchyma with decrease in the differentiation between cortex and medulla consistent with medical renal disease. No mass, calculus, parenchymal loss, or hydronephrosis visualized.  Left Kidney  Length: Left renal length is 11.2 cm. There is slight increased echogenicity of the parenchyma with decrease in the differentiation between cortex and medulla consistent with medical renal disease. No mass, calculus, parenchymal loss, or hydronephrosis visualized.  Bladder  No urinary bladder abnormality is evident. There is indentation of the bladder base by prostate. Prostate measures 5.4 x 5.1 x 3.7 cm.  There is ascites.  IMPRESSION: Kidneys  are normal size with no evidence of parenchymal loss. No hydronephrosis is seen.  There is slight increased echogenicity of the parenchyma with decrease in the differentiation between cortex and medulla consistent with medical renal disease.  No intrinsic abnormality of the urinary bladder is seen. There is indentation of the bladder base by prostate gland. Prostate measures 5.4 x 5.1 x 3.7 cm.  There is ascites.   Electronically Signed   By: [name]  Call M.D.   On: 05/14/2013 15:40   US Paracentesis  05/22/2013   CLINICAL DATA:  Cirrhosis, recurrent ascites  EXAM: ULTRASOUND GUIDED PARACENTESIS  TECHNIQUE: Survey ultrasound of the abdomen was performed and an appropriate skin entry site in the RLQ abdomen was selected. Skin site was marked, prepped with Betadine, and draped in usual sterile fashion, and infiltrated locally with 1% lidocaine. A 5 French multi-sidehole Yueh sheath needle was advanced into the peritoneal space until fluid could be aspirated. The sheath was advanced and the needle removed. 9.5 liters of milky yellow/light brown colorascites were aspirated. No immediate complication.  IMPRESSION: Technically successful ultrasound guided paracentesis, removing 9.5 liters of ascites.  Read By:  Pattricia Boss PA-C   Electronically Signed   By: Richarda Overlie M.D.   On: 05/22/2013 11:54   US Paracentesis  05/14/2013   CLINICAL DATA:  Abdominal ascites  EXAM: ULTRASOUND GUIDED RIGHT LOWER QUADRANT PARACENTESIS  COMPARISON:  Previous paracentesis  PROCEDURE: An ultrasound guided paracentesis was thoroughly discussed with the patient and questions answered. The benefits, risks, alternatives and complications were also discussed. The patient understands and wishes to proceed with the procedure. Written consent was obtained.  Ultrasound was performed to localize and mark an adequate pocket of fluid in the right lower quadrant of the abdomen. The area was then prepped and draped in the normal sterile fashion.  1% Lidocaine was used for local anesthesia. Under ultrasound guidance a 19 gauge Yeuh catheter was introduced. Paracentesis was performed. The catheter was removed and a dressing applied.  Complications: none  FINDINGS: A total of approximately 8.4 liters of milky brown fluid was removed. A fluid sample was not sent for laboratory analysis.  IMPRESSION: Successful ultrasound guided paracentesis yielding 8.4 liters of ascites.  IV albumin administered prior to procedure per MD.  BP 109/52 post Procedure the  Read by: Beckey Downing PA-C   Electronically Signed   By: Oley Balm M.D.   On: 05/14/2013 15:01    Microbiology: No results found for this or any previous visit (from the past 240 hour(s)).   Labs: Basic Metabolic Panel:  Recent Labs Lab 06/02/13 0355 06/03/13 1113  NA 117* 121*  K 5.4* 4.2  CL 78* 87*  CO2 21 23  GLUCOSE 242* 214*  BUN 59* 49*  CREATININE 1.62* 1.43*  CALCIUM 9.0  9.1   Liver Function Tests:  Recent Labs Lab 06/02/13 0355  AST 35  ALT 36  ALKPHOS 143*  BILITOT 1.9*  PROT 6.9  ALBUMIN 2.8*   No results found for this basename: LIPASE, AMYLASE,  in the last 168 hours  Recent Labs Lab 06/02/13 0355  AMMONIA 123*   CBC:  Recent Labs Lab 06/02/13 0300 06/03/13 1113  WBC 16.8* 7.2  NEUTROABS 13.5*  --   HGB 15.8 12.4*  HCT 42.8 35.5*  MCV 87.2 91.3  PLT 289 134*   Cardiac Enzymes: No results found for this basename: CKTOTAL, CKMB, CKMBINDEX, TROPONINI,  in the last 168 hours BNP: BNP (last 3 results)  Recent Labs  04/06/13 0518 04/07/13 0500  PROBNP 73.5 66.0   CBG:  Recent Labs Lab 06/02/13 2028 06/02/13 2321 06/03/13 0426 06/03/13 0803 06/03/13 1134  GLUCAP 213* 228* 140* 133* 183*       Signed:  Penny Pia  [facility] 06/03/2013, 2:16 PM

## 2013-06-04 ENCOUNTER — Ambulatory Visit (HOSPITAL_COMMUNITY): Payer: Medicare Other

## 2013-06-04 ENCOUNTER — Encounter (HOSPITAL_COMMUNITY): Payer: Medicare Other

## 2013-06-05 ENCOUNTER — Ambulatory Visit: Payer: Medicare Other | Admitting: *Deleted

## 2013-06-05 ENCOUNTER — Emergency Department (HOSPITAL_COMMUNITY): Payer: Medicare Other

## 2013-06-05 ENCOUNTER — Ambulatory Visit (HOSPITAL_COMMUNITY): Payer: Medicare Other

## 2013-06-05 ENCOUNTER — Emergency Department (HOSPITAL_COMMUNITY)
Admission: EM | Admit: 2013-06-05 | Discharge: 2013-06-05 | Disposition: A | Discharge status: Home / Self Care | Payer: Medicare Other | Attending: Emergency Medicine | Admitting: Emergency Medicine

## 2013-06-05 ENCOUNTER — Encounter (HOSPITAL_COMMUNITY): Payer: Self-pay | Admitting: Emergency Medicine

## 2013-06-05 DIAGNOSIS — Z8719 Personal history of other diseases of the digestive system: Secondary | ICD-10-CM | POA: Insufficient documentation

## 2013-06-05 DIAGNOSIS — Z87891 Personal history of nicotine dependence: Secondary | ICD-10-CM | POA: Insufficient documentation

## 2013-06-05 DIAGNOSIS — R142 Eructation: Secondary | ICD-10-CM | POA: Insufficient documentation

## 2013-06-05 DIAGNOSIS — I10 Essential (primary) hypertension: Secondary | ICD-10-CM | POA: Insufficient documentation

## 2013-06-05 DIAGNOSIS — R188 Other ascites: Secondary | ICD-10-CM | POA: Insufficient documentation

## 2013-06-05 DIAGNOSIS — R141 Gas pain: Secondary | ICD-10-CM | POA: Insufficient documentation

## 2013-06-05 DIAGNOSIS — E119 Type 2 diabetes mellitus without complications: Secondary | ICD-10-CM | POA: Insufficient documentation

## 2013-06-05 DIAGNOSIS — Z79899 Other long term (current) drug therapy: Secondary | ICD-10-CM | POA: Insufficient documentation

## 2013-06-05 DIAGNOSIS — F29 Unspecified psychosis not due to a substance or known physiological condition: Secondary | ICD-10-CM | POA: Insufficient documentation

## 2013-06-05 LAB — CBC WITH DIFFERENTIAL/PLATELET
Basophils Absolute: 0 10*3/uL (ref 0.0–0.1)
Basophils Relative: 0 % (ref 0–1)
Eosinophils Absolute: 0.1 10*3/uL (ref 0.0–0.7)
MCH: 32.7 pg (ref 26.0–34.0)
MCHC: 36.5 g/dL — ABNORMAL HIGH (ref 30.0–36.0)
Monocytes Relative: 11 % (ref 3–12)
Neutro Abs: 9.7 10*3/uL — ABNORMAL HIGH (ref 1.7–7.7)
Neutrophils Relative %: 81 % — ABNORMAL HIGH (ref 43–77)
Platelets: 130 10*3/uL — ABNORMAL LOW (ref 150–400)
RDW: 15.9 % — ABNORMAL HIGH (ref 11.5–15.5)
WBC: 12 10*3/uL — ABNORMAL HIGH (ref 4.0–10.5)

## 2013-06-05 LAB — AMMONIA: Ammonia: 49 umol/L (ref 11–60)

## 2013-06-05 LAB — POCT I-STAT TROPONIN I: Troponin i, poc: 0.02 ng/mL (ref 0.00–0.08)

## 2013-06-05 LAB — COMPREHENSIVE METABOLIC PANEL
AST: 63 U/L — ABNORMAL HIGH (ref 0–37)
Albumin: 3.7 g/dL (ref 3.5–5.2)
Alkaline Phosphatase: 153 U/L — ABNORMAL HIGH (ref 39–117)
BUN: 45 mg/dL — ABNORMAL HIGH (ref 6–23)
Calcium: 9 mg/dL (ref 8.4–10.5)
Glucose, Bld: 203 mg/dL — ABNORMAL HIGH (ref 70–99)
Potassium: 4.6 mEq/L (ref 3.5–5.1)
Sodium: 122 mEq/L — ABNORMAL LOW (ref 135–145)
Total Bilirubin: 2.5 mg/dL — ABNORMAL HIGH (ref 0.3–1.2)
Total Protein: 6.6 g/dL (ref 6.0–8.3)

## 2013-06-05 LAB — URINALYSIS, ROUTINE W REFLEX MICROSCOPIC
Glucose, UA: NEGATIVE mg/dL
Ketones, ur: NEGATIVE mg/dL
Leukocytes, UA: NEGATIVE
Nitrite: NEGATIVE
Specific Gravity, Urine: 1.024 (ref 1.005–1.030)
pH: 5.5 (ref 5.0–8.0)

## 2013-06-05 LAB — GLUCOSE, CAPILLARY: Glucose-Capillary: 200 mg/dL — ABNORMAL HIGH (ref 70–99)

## 2013-06-05 LAB — CG4 I-STAT (LACTIC ACID): Lactic Acid, Venous: 2.85 mmol/L — ABNORMAL HIGH (ref 0.5–2.2)

## 2013-06-05 MED ORDER — ALBUMIN HUMAN 25 % IV SOLN
37.5000 g | Freq: Once | INTRAVENOUS | Status: AC
  Administered 2013-06-05: 37.5 g via INTRAVENOUS
  Filled 2013-06-05: qty 150

## 2013-06-05 NOTE — ED Provider Notes (Signed)
CSN: [id number]     Arrival date & time 06/05/13  1017 History   First MD Initiated Contact with Patient 06/05/13 1027     Chief Complaint  Patient presents with  . Altered Mental Status   (Consider location/radiation/quality/duration/timing/severity/associated sxs/prior Treatment) HPI  72 year old male with history of NASH, who was brought here via family member for evaluations of increased confusion. Patient has abdominal ascites requiring paracentesis weekly who was recently admitted to the hospital over the weekend for 2-3 days and was discharge 3 days ago. He was admitted for hepatic encephalopathy due to elevated ammonia level and low sodium. When he was discharge he was more alert and oriented and and was cohesive. However last night his wife noticed that he is more confused and not making sense. He is scheduled to have a paracentesis today by family member felt his ammonia level is elevated again and would like to have it rechecked. Patient currently unable to answers all questions appropriately. He denies having effusion. Denies having any fever, chills, headache, productive cough, chest pain, trouble breathing, abdominal pain, numbness, weakness. At home he is wheelchair-bound but was able to shower while standing this morning with his brother's assistance.    Past Medical History  Diagnosis Date  . Diabetes mellitus without complication   . Hypertension   . 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 Back surgery    . Hernia repair     Family History  Problem Relation Age of Onset  . Diabetes Mellitus I Mother   . Congestive Heart Failure Mother    History  Substance Use Topics  . Smoking status: Former Games developer  . Smokeless tobacco: Not on file  . Alcohol Use: No    Review of Systems  Constitutional: Negative for fever.  Gastrointestinal: Negative for abdominal pain.  Neurological: Negative for headaches.   All other systems reviewed and are negative.    Allergies  Review of patient's allergies indicates no known allergies.  Home Medications   Current Outpatient Rx  Name  Route  Sig  Dispense  Refill  . esomeprazole (NEXIUM) 20 MG capsule   Oral   Take 1 capsule (20 mg total) by mouth every morning.   30 capsule   1   . lactose free nutrition (BOOST PLUS) LIQD   Oral   Take 237 mLs by mouth daily.   30 Can   0   . omeprazole-sodium bicarbonate (ZEGERID) 40-1100 MG per capsule   Oral   Take 1 capsule by mouth every evening.          . sitaGLIPtin (JANUVIA) 50 MG tablet   Oral   Take 1 tablet (50 mg total) by mouth daily.   30 tablet   1    BP 116/78  Pulse 88  Temp(Src) 97.7 F (36.5 C) (Oral)  Resp 18  Ht 5\' 7"  (1.702 m)  Wt 179 lb (81.194 kg)  BMI 28.03 kg/m2  SpO2 97% Physical Exam  Constitutional:  Chronically ill appearing male, appearing to be in no acute distress.  HENT:  Head: Atraumatic.  Has partial dentures, mouth is dry  Eyes: Conjunctivae and EOM are normal. Pupils are equal, round, and reactive to light. No scleral icterus.  Neck: Normal range of motion. Neck supple.  No meningismal sign  Cardiovascular: Normal rate, regular rhythm and intact distal pulses.   Pulmonary/Chest: Effort normal and breath sounds normal. He has no wheezes.  He has no rales.  Abdominal: He exhibits distension.  Abdomen is distended, no caput medusa, nontender on palpation, no guarding or rebound tenderness    Musculoskeletal: He exhibits no edema.  4/5 strength to all 4 extremities  Neurological: He is alert.  Patient is is alert and oriented, speech is goal driven. He is alert and situation an environment.  Able to identify that this is December but thought it was 2012 instead of 2014.  Mild asterixis on exam.    Skin: No rash noted.  Psychiatric: He has a normal mood and affect.    ED Course  Procedures (including critical care time)  10:56 AM Pt is in  hospice care for liver failure, hx of ascites 2/2 NASH.  Here with increased confusion according to wife and daughter who is at bedside.  Recently admitted and discharged several days ago for management of hepatic encephalopathy.  He is afebrile, no abd pain.  Low suspicion for SBP.    2:52 PM Pt is currently mentating at baseline.  His labs are reassuring, improving from recent labs.  Normal ammonia level, hyponatremia but improves from prior.  Lactic acid improved.  UA without evidence of UTI.  I have discussed with Dr. Juleen China.  Plan to have pt receive therapeutic paracentesis via IR and pt will be discharge afterward.  Pt to f/u closely with PCP for further care.  Will sign out pt to oncoming provider who will d/c pt once paracentesis has been performed.   4:20 PM Pt received paracentesis.  4.2L of fluid were withdrawn.  Will give pt 37.5g of albumin infusion here in ED and will have pt d/c once infusion is done.  Care discussed with oncoming provider.    Labs Review Labs Reviewed  CBC WITH DIFFERENTIAL - Abnormal; Notable for the following:    WBC 12.0 (*)    MCHC 36.5 (*)    RDW 15.9 (*)    Platelets 130 (*)    Neutrophils Relative % 81 (*)    Neutro Abs 9.7 (*)    Lymphocytes Relative 8 (*)    Monocytes Absolute 1.3 (*)    All other components within normal limits  COMPREHENSIVE METABOLIC PANEL - Abnormal; Notable for the following:    Sodium 122 (*)    Chloride 87 (*)    Glucose, Bld 203 (*)    BUN 45 (*)    AST 63 (*)    Alkaline Phosphatase 153 (*)    Total Bilirubin 2.5 (*)    GFR calc non Af Amer 54 (*)    GFR calc Af Amer 63 (*)    All other components within normal limits  URINALYSIS, ROUTINE W REFLEX MICROSCOPIC - Abnormal; Notable for the following:    Color, Urine AMBER (*)    All other components within normal limits  GLUCOSE, CAPILLARY - Abnormal; Notable for the following:    Glucose-Capillary 200 (*)    All other components within normal limits  CG4 I-STAT  (LACTIC ACID) - Abnormal; Notable for the following:    Lactic Acid, Venous 2.85 (*)    All other components within normal limits  AMMONIA  POCT I-STAT TROPONIN I   Imaging Review No results found.  EKG Interpretation   None       MDM   1. Ascites    BP 102/73  Pulse 111  Temp(Src) 97.7 F (36.5 C) (Oral)  Resp 18  Ht 5\' 7"  (1.702 m)  Wt 179 lb (81.194 kg)  BMI  28.03 kg/m2  SpO2 97%  I have reviewed nursing notes and vital signs.  I reviewed available ER/hospitalization records thought the EMR     Fayrene Helper, [place] 06/05/13 1624

## 2013-06-05 NOTE — ED Notes (Signed)
Paged IV team at this time.

## 2013-06-05 NOTE — ED Notes (Signed)
Istat lactic acid shown to [place], [place] and [place], [place]

## 2013-06-05 NOTE — Procedures (Signed)
Successful US guided paracentesis from RUQ.  Yielded 4.5 liters of milky yellow fluid.  No immediate complications.  Pt tolerated well.   Specimen was not sent for labs.  Pattricia Boss D PA-C 06/05/2013 4:05 PM

## 2013-06-05 NOTE — ED Notes (Signed)
Notified RN of CBG 200

## 2013-06-05 NOTE — ED Notes (Signed)
Provider at the bedside.  

## 2013-06-05 NOTE — ED Provider Notes (Signed)
Care assumed from Fayrene Helper, PA-C  Jerry [name] is a 72 y.o. male presents with AMS in hospice for liver failure.    Paracentesis without complication.   Plan: Patient will be moved to pod C; wear his albumin will be replaced. Plan is to give albumin infusion of 37.3 g and discharge home afterwards.   Face to face Exam:   General: Awake, chronically ill-appearing  HEENT: Atraumatic  Resp: Normal effort, clear equal breath sounds  Abd: Nondistended, soft and nontender  Neuro:No focal weakness  Lymph: No adenopathy  7:45pm Iron reports that albumin is complete. Patient will be discharged.  He tolerated the infusion without difficulty. Vital signs stable.  BP 107/56  Pulse 85  Temp(Src) 97.7 F (36.5 C) (Oral)  Resp 15  Ht 5\' 7"  (1.702 m)  Wt 179 lb (81.194 kg)  BMI 28.03 kg/m2  SpO2 96%      Dierdre Forth, PA-C 06/05/13 2035

## 2013-06-05 NOTE — ED Notes (Signed)
IV team at the bedside. 

## 2013-06-05 NOTE — ED Notes (Signed)
Pt to department via altered mental status. Reports that he is in hospice care for liver failure and family thinks that his ammonia level may be elevated again. Recently admitted for the same. Pt is alert to self and surroundings at this time. No complaints of pain.

## 2013-06-06 NOTE — ED Provider Notes (Signed)
Medical screening examination/treatment/procedure(s) were conducted as a shared visit with non-physician practitioner(s) and myself.  I personally evaluated the patient during the encounter.  EKG Interpretation    Date/Time:    Ventricular Rate:    PR Interval:    QRS Duration:   QT Interval:    QTC Calculation:   R Axis:     Text Interpretation:              Pt with hx of liver dx with AMS intermittently and vomiting.  Seen yesterday with 7L of ascitic fluid removed and replacement with albumin.  Pt awake and alert currently without focal deficit.  Hepatic encephalopathy today and elevated lactate.  Pt admitted for further care.  Gwyneth Sprout, MD 06/06/13 [phone]

## 2013-06-06 NOTE — ED Provider Notes (Signed)
Medical screening examination/treatment/procedure(s) were performed by non-physician practitioner and as supervising physician I was immediately available for consultation/collaboration.  EKG Interpretation   None        Cynda Soule, MD 06/06/13 0742 

## 2013-06-10 ENCOUNTER — Other Ambulatory Visit (HOSPITAL_COMMUNITY): Payer: Self-pay | Admitting: Gastroenterology

## 2013-06-10 ENCOUNTER — Ambulatory Visit (HOSPITAL_COMMUNITY)
Admission: RE | Admit: 2013-06-10 | Discharge: 2013-06-10 | Disposition: A | Discharge status: Home / Self Care | Source: Ambulatory Visit | Attending: Gastroenterology | Admitting: Gastroenterology

## 2013-06-10 ENCOUNTER — Ambulatory Visit (HOSPITAL_COMMUNITY)

## 2013-06-10 VITALS — BP 120/62 | HR 78

## 2013-06-10 DIAGNOSIS — R188 Other ascites: Secondary | ICD-10-CM | POA: Insufficient documentation

## 2013-06-10 DIAGNOSIS — K7460 Unspecified cirrhosis of liver: Secondary | ICD-10-CM | POA: Insufficient documentation

## 2013-06-10 MED ORDER — ALBUMIN HUMAN 25 % IV SOLN
25.0000 g | Freq: Once | INTRAVENOUS | Status: AC
  Administered 2013-06-10: 25 g via INTRAVENOUS
  Filled 2013-06-10: qty 100

## 2013-06-10 NOTE — Procedures (Signed)
US guided therapeutic paracentesis performed yielding 2.9 liters turbid,yellow fluid. No immediate complications. The pt will receive IV albumin postprocedure.

## 2013-06-12 ENCOUNTER — Inpatient Hospital Stay (HOSPITAL_COMMUNITY)
Admission: EM | Admit: 2013-06-12 | Discharge: 2013-06-25 | DRG: 193 | Disposition: A | Discharge status: Hospice | Attending: Internal Medicine | Admitting: Internal Medicine

## 2013-06-12 ENCOUNTER — Emergency Department (HOSPITAL_COMMUNITY)

## 2013-06-12 ENCOUNTER — Encounter (HOSPITAL_COMMUNITY): Payer: Self-pay | Admitting: Emergency Medicine

## 2013-06-12 DIAGNOSIS — Z87891 Personal history of nicotine dependence: Secondary | ICD-10-CM

## 2013-06-12 DIAGNOSIS — D72829 Elevated white blood cell count, unspecified: Secondary | ICD-10-CM | POA: Diagnosis present

## 2013-06-12 DIAGNOSIS — Z79899 Other long term (current) drug therapy: Secondary | ICD-10-CM

## 2013-06-12 DIAGNOSIS — N189 Chronic kidney disease, unspecified: Secondary | ICD-10-CM | POA: Diagnosis present

## 2013-06-12 DIAGNOSIS — I129 Hypertensive chronic kidney disease with stage 1 through stage 4 chronic kidney disease, or unspecified chronic kidney disease: Secondary | ICD-10-CM | POA: Diagnosis present

## 2013-06-12 DIAGNOSIS — E875 Hyperkalemia: Secondary | ICD-10-CM | POA: Diagnosis present

## 2013-06-12 DIAGNOSIS — J189 Pneumonia, unspecified organism: Principal | ICD-10-CM | POA: Diagnosis present

## 2013-06-12 DIAGNOSIS — K760 Fatty (change of) liver, not elsewhere classified: Secondary | ICD-10-CM | POA: Diagnosis present

## 2013-06-12 DIAGNOSIS — E119 Type 2 diabetes mellitus without complications: Secondary | ICD-10-CM | POA: Diagnosis present

## 2013-06-12 DIAGNOSIS — R339 Retention of urine, unspecified: Secondary | ICD-10-CM | POA: Diagnosis present

## 2013-06-12 DIAGNOSIS — E872 Acidosis, unspecified: Secondary | ICD-10-CM | POA: Diagnosis not present

## 2013-06-12 DIAGNOSIS — E785 Hyperlipidemia, unspecified: Secondary | ICD-10-CM | POA: Diagnosis present

## 2013-06-12 DIAGNOSIS — R7402 Elevation of levels of lactic acid dehydrogenase (LDH): Secondary | ICD-10-CM | POA: Diagnosis present

## 2013-06-12 DIAGNOSIS — R188 Other ascites: Secondary | ICD-10-CM | POA: Diagnosis present

## 2013-06-12 DIAGNOSIS — R748 Abnormal levels of other serum enzymes: Secondary | ICD-10-CM

## 2013-06-12 DIAGNOSIS — R7401 Elevation of levels of liver transaminase levels: Secondary | ICD-10-CM | POA: Diagnosis present

## 2013-06-12 DIAGNOSIS — J9600 Acute respiratory failure, unspecified whether with hypoxia or hypercapnia: Secondary | ICD-10-CM | POA: Diagnosis present

## 2013-06-12 DIAGNOSIS — D638 Anemia in other chronic diseases classified elsewhere: Secondary | ICD-10-CM | POA: Diagnosis present

## 2013-06-12 DIAGNOSIS — Z515 Encounter for palliative care: Secondary | ICD-10-CM

## 2013-06-12 DIAGNOSIS — Z66 Do not resuscitate: Secondary | ICD-10-CM | POA: Diagnosis not present

## 2013-06-12 DIAGNOSIS — Z833 Family history of diabetes mellitus: Secondary | ICD-10-CM

## 2013-06-12 DIAGNOSIS — E871 Hypo-osmolality and hyponatremia: Secondary | ICD-10-CM | POA: Diagnosis present

## 2013-06-12 DIAGNOSIS — K7682 Hepatic encephalopathy: Secondary | ICD-10-CM | POA: Diagnosis present

## 2013-06-12 DIAGNOSIS — R0602 Shortness of breath: Secondary | ICD-10-CM

## 2013-06-12 DIAGNOSIS — N179 Acute kidney failure, unspecified: Secondary | ICD-10-CM | POA: Diagnosis present

## 2013-06-12 DIAGNOSIS — K7460 Unspecified cirrhosis of liver: Secondary | ICD-10-CM | POA: Diagnosis present

## 2013-06-12 DIAGNOSIS — R197 Diarrhea, unspecified: Secondary | ICD-10-CM | POA: Diagnosis present

## 2013-06-12 DIAGNOSIS — N183 Chronic kidney disease, stage 3 unspecified: Secondary | ICD-10-CM | POA: Diagnosis present

## 2013-06-12 DIAGNOSIS — E43 Unspecified severe protein-calorie malnutrition: Secondary | ICD-10-CM | POA: Diagnosis present

## 2013-06-12 DIAGNOSIS — K7290 Hepatic failure, unspecified without coma: Secondary | ICD-10-CM | POA: Diagnosis present

## 2013-06-12 DIAGNOSIS — Z6825 Body mass index (BMI) 25.0-25.9, adult: Secondary | ICD-10-CM

## 2013-06-12 DIAGNOSIS — E861 Hypovolemia: Secondary | ICD-10-CM

## 2013-06-12 DIAGNOSIS — K7689 Other specified diseases of liver: Secondary | ICD-10-CM | POA: Diagnosis present

## 2013-06-12 HISTORY — DX: Encounter for palliative care: Z51.5

## 2013-06-12 LAB — COMPREHENSIVE METABOLIC PANEL
ALT: 76 U/L — ABNORMAL HIGH (ref 0–53)
ALT: 69 U/L — ABNORMAL HIGH (ref 0–53)
Albumin: 3.6 g/dL (ref 3.5–5.2)
Albumin: 3.3 g/dL — ABNORMAL LOW (ref 3.5–5.2)
Alkaline Phosphatase: 285 U/L — ABNORMAL HIGH (ref 39–117)
Alkaline Phosphatase: 259 U/L — ABNORMAL HIGH (ref 39–117)
BUN: 52 mg/dL — ABNORMAL HIGH (ref 6–23)
CO2: 21 mEq/L (ref 19–32)
Calcium: 9.4 mg/dL (ref 8.4–10.5)
Chloride: 82 mEq/L — ABNORMAL LOW (ref 96–112)
GFR calc Af Amer: 50 mL/min — ABNORMAL LOW (ref >90)
GFR calc Af Amer: 54 mL/min — ABNORMAL LOW (ref >90)
GFR calc non Af Amer: 47 mL/min — ABNORMAL LOW (ref >90)
Glucose, Bld: 253 mg/dL — ABNORMAL HIGH (ref 70–99)
Potassium: 5.5 mEq/L — ABNORMAL HIGH (ref 3.5–5.1)
Potassium: 5.2 mEq/L — ABNORMAL HIGH (ref 3.5–5.1)
Sodium: 119 mEq/L — CL (ref 135–145)
Sodium: 116 mEq/L — CL (ref 135–145)
Total Bilirubin: 2.6 mg/dL — ABNORMAL HIGH (ref 0.3–1.2)
Total Protein: 6.8 g/dL (ref 6.0–8.3)
Total Protein: 6.5 g/dL (ref 6.0–8.3)

## 2013-06-12 LAB — BASIC METABOLIC PANEL
BUN: 55 mg/dL — ABNORMAL HIGH (ref 6–23)
CO2: 20 mEq/L (ref 19–32)
Calcium: 9.3 mg/dL (ref 8.4–10.5)
Chloride: 84 mEq/L — ABNORMAL LOW (ref 96–112)
Creatinine, Ser: 1.44 mg/dL — ABNORMAL HIGH (ref 0.50–1.35)
Glucose, Bld: 331 mg/dL — ABNORMAL HIGH (ref 70–99)
Sodium: 118 mEq/L — CL (ref 135–145)

## 2013-06-12 LAB — CBC WITH DIFFERENTIAL/PLATELET
Basophils Relative: 0 % (ref 0–1)
Eosinophils Absolute: 0 10*3/uL (ref 0.0–0.7)
HCT: 40.5 % (ref 39.0–52.0)
MCH: 32.3 pg (ref 26.0–34.0)
MCHC: 36.5 g/dL — ABNORMAL HIGH (ref 30.0–36.0)
MCV: 88.4 fL (ref 78.0–100.0)
Monocytes Absolute: 1.3 10*3/uL — ABNORMAL HIGH (ref 0.1–1.0)
Neutrophils Relative %: 86 % — ABNORMAL HIGH (ref 43–77)
Platelets: 133 10*3/uL — ABNORMAL LOW (ref 150–400)
RDW: 15.8 % — ABNORMAL HIGH (ref 11.5–15.5)

## 2013-06-12 LAB — AMMONIA: Ammonia: 86 umol/L — ABNORMAL HIGH (ref 11–60)

## 2013-06-12 LAB — MAGNESIUM: Magnesium: 3 mg/dL — ABNORMAL HIGH (ref 1.5–2.5)

## 2013-06-12 LAB — GLUCOSE, CAPILLARY: Glucose-Capillary: 325 mg/dL — ABNORMAL HIGH (ref 70–99)

## 2013-06-12 MED ORDER — SODIUM CHLORIDE 0.9 % IV BOLUS (SEPSIS)
500.0000 mL | Freq: Once | INTRAVENOUS | Status: AC
  Administered 2013-06-12: 500 mL via INTRAVENOUS

## 2013-06-12 MED ORDER — ALBUTEROL SULFATE (2.5 MG/3ML) 0.083% IN NEBU
2.5000 mg | INHALATION_SOLUTION | RESPIRATORY_TRACT | Status: DC | PRN
  Filled 2013-06-12: qty 3

## 2013-06-12 MED ORDER — AZITHROMYCIN 500 MG PO TABS
500.0000 mg | ORAL_TABLET | Freq: Every day | ORAL | Status: DC
  Administered 2013-06-12: 500 mg via ORAL
  Filled 2013-06-12: qty 1

## 2013-06-12 MED ORDER — PIPERACILLIN-TAZOBACTAM 3.375 G IVPB 30 MIN
3.3750 g | Freq: Once | INTRAVENOUS | Status: AC
  Administered 2013-06-12: 3.375 g via INTRAVENOUS
  Filled 2013-06-12: qty 50

## 2013-06-12 MED ORDER — INSULIN ASPART 100 UNIT/ML ~~LOC~~ SOLN
0.0000 [IU] | Freq: Three times a day (TID) | SUBCUTANEOUS | Status: DC
  Administered 2013-06-12: 16:00:00 7 [IU] via SUBCUTANEOUS
  Administered 2013-06-13: 3 [IU] via SUBCUTANEOUS
  Administered 2013-06-13: 17:00:00 7 [IU] via SUBCUTANEOUS
  Administered 2013-06-13: 5 [IU] via SUBCUTANEOUS
  Administered 2013-06-14: 10:00:00 3 [IU] via SUBCUTANEOUS
  Administered 2013-06-14 (×2): 5 [IU] via SUBCUTANEOUS
  Administered 2013-06-15: 3 [IU] via SUBCUTANEOUS
  Administered 2013-06-15: 7 [IU] via SUBCUTANEOUS

## 2013-06-12 MED ORDER — DEXTROSE 5 % IV SOLN
2.0000 g | INTRAVENOUS | Status: DC
  Administered 2013-06-12 – 2013-06-15 (×4): 2 g via INTRAVENOUS
  Filled 2013-06-12 (×4): qty 2

## 2013-06-12 MED ORDER — SODIUM CHLORIDE 0.9 % IJ SOLN: 3.0000 mL | INTRAMUSCULAR | Status: DC | PRN

## 2013-06-12 MED ORDER — SODIUM CHLORIDE 0.9 % IV SOLN: 250.0000 mL | INTRAVENOUS | Status: DC | PRN

## 2013-06-12 MED ORDER — DEXTROSE 5 % IV SOLN
1.0000 g | INTRAVENOUS | Status: DC
  Filled 2013-06-12: qty 10

## 2013-06-12 MED ORDER — BOOST PLUS PO LIQD
237.0000 mL | Freq: Every day | ORAL | Status: DC
  Administered 2013-06-12 – 2013-06-13 (×2): 237 mL via ORAL
  Filled 2013-06-12 (×2): qty 237

## 2013-06-12 MED ORDER — SODIUM CHLORIDE 0.9 % IJ SOLN
3.0000 mL | Freq: Two times a day (BID) | INTRAMUSCULAR | Status: DC
  Administered 2013-06-12 (×2): 3 mL via INTRAVENOUS

## 2013-06-12 MED ORDER — PANTOPRAZOLE SODIUM 40 MG PO TBEC: 40.0000 mg | DELAYED_RELEASE_TABLET | Freq: Every day | ORAL | Status: DC

## 2013-06-12 MED ORDER — LINAGLIPTIN 5 MG PO TABS
5.0000 mg | ORAL_TABLET | Freq: Every day | ORAL | Status: DC
  Administered 2013-06-12 – 2013-06-25 (×11): 5 mg via ORAL
  Filled 2013-06-12 (×14): qty 1

## 2013-06-12 MED ORDER — PANTOPRAZOLE SODIUM 40 MG PO TBEC
40.0000 mg | DELAYED_RELEASE_TABLET | Freq: Every day | ORAL | Status: DC
  Administered 2013-06-12 – 2013-06-15 (×4): 40 mg via ORAL
  Filled 2013-06-12 (×4): qty 1

## 2013-06-12 MED ORDER — LACTULOSE 10 GM/15ML PO SOLN
10.0000 g | Freq: Three times a day (TID) | ORAL | Status: DC
  Administered 2013-06-12 – 2013-06-16 (×14): 10 g via ORAL
  Filled 2013-06-12 (×15): qty 15

## 2013-06-12 MED ORDER — VANCOMYCIN HCL IN DEXTROSE 1-5 GM/200ML-% IV SOLN
1000.0000 mg | Freq: Once | INTRAVENOUS | Status: AC
  Administered 2013-06-12: 1000 mg via INTRAVENOUS
  Filled 2013-06-12: qty 200

## 2013-06-12 MED ORDER — VANCOMYCIN HCL IN DEXTROSE 750-5 MG/150ML-% IV SOLN
750.0000 mg | Freq: Two times a day (BID) | INTRAVENOUS | Status: DC
  Administered 2013-06-12 – 2013-06-14 (×5): 750 mg via INTRAVENOUS
  Filled 2013-06-12 (×7): qty 150

## 2013-06-12 MED ORDER — ENOXAPARIN SODIUM 30 MG/0.3ML ~~LOC~~ SOLN
30.0000 mg | SUBCUTANEOUS | Status: DC
  Administered 2013-06-12 – 2013-06-13 (×2): 30 mg via SUBCUTANEOUS
  Filled 2013-06-12 (×3): qty 0.3

## 2013-06-12 NOTE — ED Notes (Signed)
Pt arrived to the ED with a complaint of a cough and congestion.  Pt had a paracentesis on Tuesday.  Tonight the pt is coughing as if to get mucous out of his throat but is not being able to achieve that.  Pt is a hospice patient who was sent here by hospice for evaluation given his recent procedure.

## 2013-06-12 NOTE — ED Provider Notes (Signed)
CSN: [id number]     Arrival date & time 06/12/13  0200 History   First MD Initiated Contact with Patient 06/12/13 0424     Chief Complaint  Patient presents with  . Cough   (Consider location/radiation/quality/duration/timing/severity/associated sxs/prior Treatment) HPI Patient with history of diabetes, hypertension and cirrhosis presents with one week of cough and congestion. His cough is productive of a clear sputum. According to family patient also been less responsive. He had a paracentesis last week in the emergency department. Patient is quite drowsy. He denies any specific pain including chest pain, abdominal pain. He has had no nausea, vomiting or diarrhea. Past Medical History  Diagnosis Date  . Diabetes mellitus without complication   . Hypertension   . 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 Back surgery    . Hernia repair     Family History  Problem Relation Age of Onset  . Diabetes Mellitus I Mother   . Congestive Heart Failure Mother    History  Substance Use Topics  . Smoking status: Former Games developer  . Smokeless tobacco: Not on file  . Alcohol Use: No    Review of Systems  Constitutional: Negative for fever and chills.  Respiratory: Positive for cough and shortness of breath. Negative for chest tightness and wheezing.   Cardiovascular: Negative for chest pain, palpitations and leg swelling.  Gastrointestinal: Negative for nausea, vomiting, abdominal pain, diarrhea, constipation, blood in stool and abdominal distention.  Musculoskeletal: Negative for back pain, myalgias, neck pain and neck stiffness.  Skin: Negative for rash and wound.  Neurological: Positive for weakness (generalized). Negative for dizziness, light-headedness, numbness and headaches.  All other systems reviewed and are negative.    Allergies  Review of patient's allergies indicates no known allergies.  Home Medications    Current Outpatient Rx  Name  Route  Sig  Dispense  Refill  . esomeprazole (NEXIUM) 20 MG capsule   Oral   Take 1 capsule (20 mg total) by mouth every morning.   30 capsule   1   . lactose free nutrition (BOOST PLUS) LIQD   Oral   Take 237 mLs by mouth daily.   30 Can   0   . lactulose (CHRONULAC) 10 GM/15ML solution   Oral   Take 10 g by mouth 3 (three) times daily.         Marland Kitchen omeprazole-sodium bicarbonate (ZEGERID) 40-1100 MG per capsule   Oral   Take 1 capsule by mouth every evening.          . sitaGLIPtin (JANUVIA) 50 MG tablet   Oral   Take 1 tablet (50 mg total) by mouth daily.   30 tablet   1    BP 108/54  Pulse 96  Temp(Src) 98.5 F (36.9 C) (Oral)  Resp 14  SpO2 94% Physical Exam  Nursing note and vitals reviewed. Constitutional: He is oriented to person, place, and time. He appears well-developed and well-nourished. No distress.  Chronically ill-appearing  HENT:  Head: Normocephalic and atraumatic.  Mouth/Throat: Oropharynx is clear and moist.  Eyes: EOM are normal. Pupils are equal, round, and reactive to light.  Neck: Normal range of motion. Neck supple.  No nuchal rigidity  Cardiovascular: Normal rate and regular rhythm.   Pulmonary/Chest: Effort normal. No respiratory distress. He has no wheezes. He has rales (scattered rhonchi).  Abdominal: Soft. Bowel sounds are normal. He exhibits no distension and no mass.  There is no tenderness. There is no rebound and no guarding.  Musculoskeletal: Normal range of motion. He exhibits no edema and no tenderness.  No calf swelling or pain.  Neurological: He is oriented to person, place, and time.  Drowsy but arousable. Moves all extremities without focal deficit.  Skin: Skin is warm and dry. No rash noted. No erythema.  Psychiatric: He has a normal mood and affect. His behavior is normal.    ED Course  Procedures (including critical care time) Labs Review Labs Reviewed  CBC WITH DIFFERENTIAL   COMPREHENSIVE METABOLIC PANEL  URINALYSIS, ROUTINE W REFLEX MICROSCOPIC  AMMONIA   Imaging Review Dg Chest 2 View  06/12/2013   CLINICAL DATA:  Cough and nausea.  EXAM: CHEST  2 VIEW  COMPARISON:  Chest radiograph performed 06/02/2013  FINDINGS: The lungs are hypoexpanded. Mild left basilar opacity may reflect atelectasis or possibly mild pneumonia. There is no evidence of pleural effusion or pneumothorax.  The heart is normal in size; the mediastinal contour is within normal limits. No acute osseous abnormalities are seen.  IMPRESSION: Lungs hypoexpanded. Mild left basilar airspace opacity may reflect atelectasis or possibly mild pneumonia.   Electronically Signed   By: Roanna Raider M.D.   On: 06/12/2013 04:30   US Paracentesis  06/10/2013   CLINICAL DATA:  Cirrhosis, recurrent ascites. Request is made for therapeutic paracentesis.  EXAM: ULTRASOUND GUIDED THERAPEUTIC PARACENTESIS  COMPARISON:  PREVIOUS PARACENTESIS ON 05/22/2013.  PROCEDURE: An ultrasound guided paracentesis was thoroughly discussed with the patient and questions answered. The benefits, risks, alternatives and complications were also discussed. The patient understands and wishes to proceed with the procedure. Written consent was obtained.  Ultrasound was performed to localize and mark an adequate pocket of fluid in the left mid to lower quadrant of the abdomen. The area was then prepped and draped in the normal sterile fashion. 1% Lidocaine was used for local anesthesia. Under ultrasound guidance a 19 gauge Yueh catheter was introduced. Paracentesis was performed. The catheter was removed and a dressing applied.  Complications: None.  FINDINGS: A total of approximately 2.9 liters of turbid, yellow fluid was removed.  IMPRESSION: Successful ultrasound guided therapeutic paracentesis yielding 2.9 liters of ascites.  Read by: Jeananne Rama ,P.A.-C.   Electronically Signed   By: Simonne Come M.D.   On: 06/10/2013 13:02    EKG  Interpretation   None       MDM   Discussed with [facility] and we'll admit for altered mental status, hyponatremia, hepatic encephalopathy and questionable hospital-acquired pneumonia   Loren Racer, MD 06/13/13 [phone]

## 2013-06-12 NOTE — Progress Notes (Addendum)
ANTIBIOTIC CONSULT NOTE - INITIAL  Pharmacy Consult for vancomycin/cefepime Indication: HCAP  No Known Allergies  Patient Measurements: Height: 5\' 9"  (175.3 cm) Weight: 174 lb 2.6 oz (79 kg) IBW/kg (Calculated) : 70.7 Adjusted Body Weight:   Vital Signs: Temp: 98.2 F (36.8 C) (12/25 1526) Temp src: Oral (12/25 1526) BP: 106/57 mmHg (12/25 1526) Pulse Rate: 101 (12/25 1526) Intake/Output from previous day:   Intake/Output from this shift: Total I/O In: -  Out: 350 [Urine:350]  Labs:  Recent Labs  06/12/13 0508 06/12/13 1056  WBC 16.4*  --   HGB 14.8  --   PLT 133*  --   CREATININE 1.53* 1.43*   Estimated Creatinine Clearance: 45.3 ml/min (by C-G formula based on Cr of 1.43). No results found for this basename: VANCOTROUGH, VANCOPEAK, VANCORANDOM, GENTTROUGH, GENTPEAK, GENTRANDOM, TOBRATROUGH, TOBRAPEAK, TOBRARND, AMIKACINPEAK, AMIKACINTROU, AMIKACIN,  in the last 72 hours   Microbiology: No results found for this or any previous visit (from the past 720 hour(s)).  Medical History: Past Medical History  Diagnosis Date  . Diabetes mellitus without complication   . Hypertension   . Cirrhosis, non-alcoholic     October 2014 diagnosed    Assessment: 20 YOM presents with altered mental status, he was recently admitted 06/02/13 for confusion.  He has h/o NASH requiring large-volume paracentesis fairly frequently.  Orders to start vancomycin and cefepime for pneumonia. Given vancomycin and zosyn x 1 12/25am.   12/25>>vancomycin  >> 12/25 >>cefepime  >>    Tmax: afebrile WBCs: 16.4 Renal: Scr = 1.43 for est CrCl 40ml/min (C-G) and (N)  12/25 blood: pending / sputum: ordered 12/25: respiratory virus panel: ordered 12/25: legionella urine Ag: pending 12/25: S. Pneumoniae urine Ag: pending  Goal of Therapy:  Vancomycin trough level 15-20 mcg/ml  Plan:   Vancomycin 1gm given in ED 0625 this am, start 750mg  IV q12h, watching renal function closely  Check  vancomycin trough at steady state  Cefepime 2gm IV q24h   Dannielle Huh 06/12/2013,3:40 PM

## 2013-06-12 NOTE — H&P (Signed)
[facility] History and Physical  Other Atienza WGN:[id number] DOB: 06-30-1940 DOA: 06/12/2013  Referring physician: ED physician PCP: Thora Lance, MD   Chief Complaint: altered mental status   HPI:  Pt is 72 yo male with complicated and complex medical history including liver cirrhosis secondary to NASH and requiring frequent paracentesis due to rapidly re accumulating ascites, last one done 06/10/2013 and 2.9 L removed, HTN, HLD, DM with last HgA1C 7.1 (06/02/2013), CKD stage II -III, with baseline Cr 1.2 - 1.6, Anemia of chronic disease with baseline hg ~11. He is now presenting to [facility] ED with main concern of several days duration of progressively worsening altered mental status associated with exertional dyspnea as well as dyspnea at rest, subjective fevers, chills, cough productive of clear sputum. Pt denies any specific abdominal or urinary concerns and explains he has tolerated paracentesis well few days ago.  In ED, pt is rather drowsy but hemodynamically stable. Electrolyte panel notable for K = 5.5, Na 119, Cr 1.46. Ammonia level 86. CXR worrisome for PNA. [facility] asked to admit for further evaluation. Telemetry bed requested.   Assessment and Plan:  Principal Problem:   Acute respiratory failure - based on symptoms and CXR findings mostly consistent with PNA - will admit to telemetry bed and will start with treating with broad spectrum ABX to cover for HCAP, Vancomycin and Maxipime  - will also obtain sputum analysis and urine legionella and strep pneumo - provide oxygen as needed, BD's PRN  Active Problems:   Ascites - status post paracentesis 06/10/2013 - 2.9 L removed and pt has tolerated well    Acute on chronic renal failure - likely secondary to pre renal etiology, recent paracentesis 12/23 - will give very small bolus of NS 500 cc  - monitor I's and O's, daily weights - repeat BMP in AM   Hyponatremia - likely pre renal as well imposed on chronic  hyponatremia - will give small NS bolus as noted above and will repeat BMP in AM   Pneumonia, HCAP  - will treat as HCAP given recent hospitalization, discharged 06/03/2014 - sputum analysis ordered and will need to follow up on results - Vancomycin and Maxipime ordered    Leukocytosis, unspecified - secondary to HCAP as noted above - ABX and repeat CBC in AM   Transaminitis - secondary to NAFLD (nonalcoholic fatty liver disease)   Diabetes mellitus, type II - recent A1C 7.1 - continue Januvia - place on SSI as well    Hepatic encephalopathy - ammonia 86 on admission  - continue Lactulose, place on Rifaximin - repeat ammonia in AM   Hyperkalemia - mild, repeat BMP now - monitor on telemetry    Protein-calorie malnutrition, severe - nutrition consultation   Code Status: Full Family Communication: Pt at bedside Disposition Plan: Admit to telemetry bed    Review of Systems:  Constitutional: Negative for diaphoresis.  HENT: Negative for hearing loss, ear pain, nosebleeds, congestion, sore throat, neck pain, tinnitus and ear discharge.   Eyes: Negative for blurred vision, double vision, photophobia, pain, discharge and redness.  Respiratory: Negative for  hemoptysis, wheezing and stridor.   Cardiovascular: Negative for chest pain, palpitations, orthopnea, claudication and leg swelling.  Gastrointestinal:  Negative for heartburn, constipation, blood in stool and melena.  Genitourinary: Negative for dysuria, urgency, frequency, hematuria and flank pain.  Musculoskeletal: Negative for myalgias, back pain, joint pain and falls.  Skin: Negative for itching and rash.  Neurological: NNegative for tingling, tremors, sensory change, speech change,  focal weakness, loss of consciousness and headaches.  Endo/Heme/Allergies: Negative for environmental allergies and polydipsia. Does not bruise/bleed easily.  Psychiatric/Behavioral: Negative for suicidal ideas. The patient is not  nervous/anxious.      Past Medical History  Diagnosis Date  . Diabetes mellitus without complication   . Hypertension   . Cirrhosis, non-alcoholic     October 2014 diagnosed    Past Surgical History  Procedure Laterality Date  . Back surgery    . Hernia repair      Social History:  reports that he has quit smoking. He does not have any smokeless tobacco history on file. He reports that he does not drink alcohol or use illicit drugs.  No Known Allergies  Family History  Problem Relation Age of Onset  . Diabetes Mellitus I Mother   . Congestive Heart Failure Mother     Prior to Admission medications   Medication Sig Start Date End Date Taking? Authorizing Provider  esomeprazole (NEXIUM) 20 MG capsule Take 1 capsule (20 mg total) by mouth every morning. 05/17/13  Yes Catarina Hartshorn, MD  lactose free nutrition (BOOST PLUS) LIQD Take 237 mLs by mouth daily. 06/03/13  Yes Penny Pia, MD  lactulose (CHRONULAC) 10 GM/15ML solution Take 10 g by mouth 3 (three) times daily.   Yes Historical Provider, MD  omeprazole-sodium bicarbonate (ZEGERID) 40-1100 MG per capsule Take 1 capsule by mouth every evening.  04/01/13  Yes Historical Provider, MD  sitaGLIPtin (JANUVIA) 50 MG tablet Take 1 tablet (50 mg total) by mouth daily. 05/17/13  Yes Catarina Hartshorn, MD    Physical Exam: Filed Vitals:   06/12/13 0206 06/12/13 0455 06/12/13 0722 06/12/13 0909  BP: 123/67 108/54 125/75 107/69  Pulse: 103 96 88 87  Temp: 98.5 F (36.9 C)   98.2 F (36.8 C)  TempSrc: Oral   Oral  Resp: 14 14 16    Height:    5\' 9"  (1.753 m)  Weight:    79 kg (174 lb 2.6 oz)  SpO2: 97% 94% 97% 97%    Physical Exam  Constitutional: Appears somnolent but NAD, easy to arouse HENT: Normocephalic. External right and left ear normal. Dry MM Eyes: Conjunctivae and EOM are normal. PERRLA, no scleral icterus.  Neck: Normal ROM. Neck supple. No JVD. No tracheal deviation. No thyromegaly.  CVS: RRR, S1/S2 +, no murmurs, no  gallops, no carotid bruit.  Pulmonary: Effort and breath sounds normal, no stridor, rhonchi, diminished breath sounds at bases  Abdominal: Soft. BS +,  no distension, tenderness, rebound or guarding.  Musculoskeletal: Normal range of motion. No edema and no tenderness.  Lymphadenopathy: No lymphadenopathy noted, cervical, inguinal. Neuro: Somnolent. Normal reflexes, muscle tone normal. No cranial nerve deficit. Skin: Skin is warm and dry. No rash noted. Not diaphoretic. No erythema. No pallor.  Psychiatric: Normal mood and affect. Behavior, judgment, thought content normal.   Labs on Admission:  Basic Metabolic Panel:  Recent Labs Lab 06/05/13 1054 06/12/13 0508  NA 122* 119*  K 4.6 5.5*  CL 87* 83*  CO2 25 22  GLUCOSE 203* 274*  BUN 45* 56*  CREATININE 1.27 1.53*  CALCIUM 9.0 9.4   Liver Function Tests:  Recent Labs Lab 06/05/13 1054 06/12/13 0508  AST 63* 72*  ALT 53 76*  ALKPHOS 153* 285*  BILITOT 2.5* 2.6*  PROT 6.6 6.8  ALBUMIN 3.7 3.6    Recent Labs Lab 06/05/13 1054 06/12/13 0508  AMMONIA 49 86*   CBC:  Recent Labs Lab 06/05/13 1054  06/12/13 0508  WBC 12.0* 16.4*  NEUTROABS 9.7* 14.1*  HGB 14.4 14.8  HCT 39.5 40.5  MCV 89.8 88.4  PLT 130* 133*    CBG:  Recent Labs Lab 06/05/13 1058  GLUCAP 200*   Radiological Exams on Admission: Dg Chest 2 View   06/12/2013   Lungs hypoexpanded. Mild left basilar airspace opacity may reflect atelectasis or possibly mild pneumonia.   US Paracentesis  06/10/2013  Successful ultrasound guided therapeutic paracentesis yielding 2.9 liters of ascites.    EKG: Normal sinus rhythm, no ST/T wave changes  Debbora Presto, MD  [facility] Pager [phone]  If 7PM-7AM, please contact night-coverage [email or web address] Password [facility] 06/12/2013, 9:35 AM

## 2013-06-13 DIAGNOSIS — J9600 Acute respiratory failure, unspecified whether with hypoxia or hypercapnia: Secondary | ICD-10-CM

## 2013-06-13 DIAGNOSIS — E119 Type 2 diabetes mellitus without complications: Secondary | ICD-10-CM

## 2013-06-13 DIAGNOSIS — K7460 Unspecified cirrhosis of liver: Secondary | ICD-10-CM

## 2013-06-13 LAB — COMPREHENSIVE METABOLIC PANEL
AST: 64 U/L — ABNORMAL HIGH (ref 0–37)
Albumin: 3.2 g/dL — ABNORMAL LOW (ref 3.5–5.2)
Calcium: 9.2 mg/dL (ref 8.4–10.5)
Chloride: 85 mEq/L — ABNORMAL LOW (ref 96–112)
Creatinine, Ser: 1.43 mg/dL — ABNORMAL HIGH (ref 0.50–1.35)
Sodium: 119 mEq/L — CL (ref 135–145)

## 2013-06-13 LAB — LEGIONELLA ANTIGEN, URINE

## 2013-06-13 LAB — GLUCOSE, CAPILLARY
Glucose-Capillary: 242 mg/dL — ABNORMAL HIGH (ref 70–99)
Glucose-Capillary: 282 mg/dL — ABNORMAL HIGH (ref 70–99)
Glucose-Capillary: 326 mg/dL — ABNORMAL HIGH (ref 70–99)
Glucose-Capillary: 287 mg/dL — ABNORMAL HIGH (ref 70–99)

## 2013-06-13 LAB — CBC
Hemoglobin: 14.6 g/dL (ref 13.0–17.0)
MCH: 32 pg (ref 26.0–34.0)
MCV: 88.6 fL (ref 78.0–100.0)
RBC: 4.56 MIL/uL (ref 4.22–5.81)
RDW: 16 % — ABNORMAL HIGH (ref 11.5–15.5)

## 2013-06-13 LAB — URINALYSIS, ROUTINE W REFLEX MICROSCOPIC
Hgb urine dipstick: NEGATIVE
Nitrite: NEGATIVE
Protein, ur: NEGATIVE mg/dL
Urobilinogen, UA: 1 mg/dL (ref 0.0–1.0)

## 2013-06-13 LAB — STREP PNEUMONIAE URINARY ANTIGEN: Strep Pneumo Urinary Antigen: NEGATIVE

## 2013-06-13 MED ORDER — BOOST PLUS PO LIQD
237.0000 mL | Freq: Every day | ORAL | Status: DC
  Administered 2013-06-14 – 2013-06-22 (×6): 237 mL via ORAL
  Filled 2013-06-13 (×13): qty 237

## 2013-06-13 MED ORDER — SODIUM POLYSTYRENE SULFONATE 15 GM/60ML PO SUSP
15.0000 g | Freq: Once | ORAL | Status: AC
  Administered 2013-06-13: 13:00:00 15 g via ORAL
  Filled 2013-06-13: qty 60

## 2013-06-13 MED ORDER — GLUCERNA SHAKE PO LIQD
237.0000 mL | Freq: Two times a day (BID) | ORAL | Status: DC
  Administered 2013-06-14 – 2013-06-23 (×16): 237 mL via ORAL
  Filled 2013-06-13 (×25): qty 237

## 2013-06-13 NOTE — Progress Notes (Signed)
Inpatient Room 1415, [facility] RN visit Note  Pt admitted on 06/12/13 with complaints of increased congestion, cough, emesis, and AMS.  Pt's Ammonia level was 86 and he was dx with HCA pneumonia.  Pt lying in bed with eyes closed with family at bedside.  He reported feeling bad but he no longer has been feeling nauseated.  Pt has not been eating.   Per [name] his spouse the pt never really was nauseated as much as he was gagging because of the congestion/cough.  Last night perJoyce pt was reported to have been restless with some confusion.  She prefers that he stays longer than just a day or two in the hospital to avoid him having to come right back.    Pt currently under hospice services for Chronic Liver Disease.  This is most likely a hospice related admission.  Writer will confirm with [facility] MD.    Please contact [facility] at [phone] with any Hospice concerns.  Elijah Birk RN, [facility] [place] [phone]

## 2013-06-13 NOTE — Progress Notes (Signed)
CRITICAL VALUE ALERT  Critical value received:  Na+ 119  Date of notification:  06/13/2013  Time of notification:  0610   Critical value read back:yes  Nurse who received alert:  Eathel Pajak  MD notified (1st page): Benedetto Coons  Time of first page:  0615  MD notified (2nd page):  Time of second page:  Responding MD:  T.Callahan  Time MD responded:  [phone]

## 2013-06-13 NOTE — Progress Notes (Signed)
Inpatient Room 1415, [facility]  This is a hospice related admission per Dr Jamie Brookes at [facility].  Elijah Birk RN, [facility] RN, [facility] [place] [phone]

## 2013-06-13 NOTE — Progress Notes (Signed)
[facility] PROGRESS NOTE  Jerry [name] ZOX:[id number] DOB: [birth year] DOA: 06/12/2013 PCP: Thora Lance, MD  Assessment/Plan:  Acute respiratory failure  - based on symptoms and CXR findings mostly consistent with PNA - will admit to telemetry bed and will start with treating with broad spectrum ABX to cover for HCAP, Vancomycin and Maxipime  - will also obtain sputum analysis and urine legionella and strep pneumo  - provide oxygen as needed, BD's PRN  Active Problems:  Ascites  - status post paracentesis 06/10/2013  - 2.9 L removed and pt has tolerated well  Acute on chronic renal failure  - likely secondary to pre renal etiology, recent paracentesis 12/23  - monitor I's and O's, daily weights  - repeat BMP in AM  Hyponatremia  - likely pre renal as well imposed on chronic hyponatremia  - will give small NS bolus as noted above and will repeat BMP in AM  Pneumonia, HCAP  - will treat as HCAP given recent hospitalization, discharged 06/03/2014  - sputum analysis ordered and will need to follow up on results  - Vancomycin and Maxipime ordered  Leukocytosis, unspecified  - secondary to HCAP as noted above  - ABX and repeat CBC in AM  Transaminitis  - secondary to NAFLD (nonalcoholic fatty liver disease)  Diabetes mellitus, type II  - recent A1C 7.1  - continue Januvia  - place on SSI as well  Hepatic encephalopathy  - ammonia 86 on admission  - continue Lactulose, place on Rifaximin  - repeat ammonia in AM  Hyperkalemia  - mild, repeat BMP  In am. One dose of kayexalate given.  - monitor on telemetry  Protein-calorie malnutrition, severe  - nutrition consultation  Code Status: Full  Family Communication: Pt at bedside  Disposition Plan: Admit to telemetry bed     Consultants:none  Procedures: none  Antibiotics: Vancomycin 12/25 Cefepime 12/25  HPI/Subjective: Reports he was not feeling good.  Objective: Filed Vitals:   06/13/13 1455  BP:  110/68  Pulse: 92  Temp: 98.4 F (36.9 C)  Resp: 18    Intake/Output Summary (Last 24 hours) at 06/13/13 1532 Last data filed at 06/13/13 1420  Gross per 24 hour  Intake    475 ml  Output     81 ml  Net    394 ml   Filed Weights   06/12/13 0909 06/13/13 0507  Weight: 79 kg (174 lb 2.6 oz) 80.2 kg (176 lb 12.9 oz)    Exam:   General:  Alert afebrile comfortable  Cardiovascular: s1s2  Respiratory: scattered rhonchi.   Abdomen: soft, distended, mild tenderness bs+  Musculoskeletal: pedal edema present.   Data Reviewed: Basic Metabolic Panel:  Recent Labs Lab 06/12/13 0508 06/12/13 1056 06/12/13 1608 06/13/13 0500  NA 119* 116* 118* 119*  K 5.5* 5.2* 4.8 5.2*  CL 83* 82* 84* 85*  CO2 22 21 20 22   GLUCOSE 274* 253* 331* 268*  BUN 56* 52* 55* 54*  CREATININE 1.53* 1.43* 1.44* 1.43*  CALCIUM 9.4 9.1 9.3 9.2  MG  --  3.0*  --   --    Liver Function Tests:  Recent Labs Lab 06/12/13 0508 06/12/13 1056 06/13/13 0500  AST 72* 60* 64*  ALT 76* 69* 69*  ALKPHOS 285* 259* 262*  BILITOT 2.6* 2.6* 3.0*  PROT 6.8 6.5 6.2  ALBUMIN 3.6 3.3* 3.2*   No results found for this basename: LIPASE, AMYLASE,  in the last 168 hours  Recent Labs Lab 06/12/13 0508  06/13/13 0500  AMMONIA 86* 73*   CBC:  Recent Labs Lab 06/12/13 0508 06/13/13 0500  WBC 16.4* 14.7*  NEUTROABS 14.1*  --   HGB 14.8 14.6  HCT 40.5 40.4  MCV 88.4 88.6  PLT 133* 129*   Cardiac Enzymes: No results found for this basename: CKTOTAL, CKMB, CKMBINDEX, TROPONINI,  in the last 168 hours BNP (last 3 results)  Recent Labs  04/06/13 0518 04/07/13 0500 06/12/13 1056  PROBNP 73.5 66.0 447.5*   CBG:  Recent Labs Lab 06/12/13 1557 06/12/13 2115 06/13/13 0739 06/13/13 1129  GLUCAP 325* 264* 242* 282*    Recent Results (from the past 240 hour(s))  CULTURE, BLOOD (ROUTINE X 2)     Status: None   Collection Time    06/12/13 10:56 AM      Result Value Range Status   Specimen  Description BLOOD RIGHT HAND   Final   Special Requests     Final   Value: BOTTLES DRAWN AEROBIC AND ANAEROBIC 5CC BOTH BOTTLES   Culture  Setup Time     Final   Value: 06/12/2013 14:52     Performed at [facility]   Culture     Final   Value:        BLOOD CULTURE RECEIVED NO GROWTH TO DATE CULTURE WILL BE HELD FOR 5 DAYS BEFORE ISSUING A FINAL NEGATIVE REPORT     Performed at [facility]   Report Status PENDING   Incomplete  CULTURE, BLOOD (ROUTINE X 2)     Status: None   Collection Time    06/12/13 11:00 AM      Result Value Range Status   Specimen Description BLOOD LEFT ARM   Final   Special Requests BOTTLES DRAWN AEROBIC ONLY 3CC   Final   Culture  Setup Time     Final   Value: 06/12/2013 14:52     Performed at [facility]   Culture     Final   Value:        BLOOD CULTURE RECEIVED NO GROWTH TO DATE CULTURE WILL BE HELD FOR 5 DAYS BEFORE ISSUING A FINAL NEGATIVE REPORT     Performed at [facility]   Report Status PENDING   Incomplete     Studies: Dg Chest 2 View  06/12/2013   CLINICAL DATA:  Cough and nausea.  EXAM: CHEST  2 VIEW  COMPARISON:  Chest radiograph performed 06/02/2013  FINDINGS: The lungs are hypoexpanded. Mild left basilar opacity may reflect atelectasis or possibly mild pneumonia. There is no evidence of pleural effusion or pneumothorax.  The heart is normal in size; the mediastinal contour is within normal limits. No acute osseous abnormalities are seen.  IMPRESSION: Lungs hypoexpanded. Mild left basilar airspace opacity may reflect atelectasis or possibly mild pneumonia.   Electronically Signed   By: Roanna Raider M.D.   On: 06/12/2013 04:30    Scheduled Meds: . ceFEPime (MAXIPIME) IV  2 g Intravenous Q24H  . enoxaparin (LOVENOX) injection  30 mg Subcutaneous Q24H  . insulin aspart  0-9 Units Subcutaneous TID WC  . lactose free nutrition  237 mL Oral Daily  . lactulose  10 g Oral TID  . linagliptin  5 mg Oral Daily  .  pantoprazole  40 mg Oral Daily  . sodium chloride  3 mL Intravenous Q12H  . vancomycin  750 mg Intravenous Q12H   Continuous Infusions:   Principal Problem:   Acute respiratory failure Active Problems:  Ascites   Acute on chronic renal failure   Transaminitis   Hyponatremia   NAFLD (nonalcoholic fatty liver disease)   Diabetes mellitus, type II   Hepatic encephalopathy   Protein-calorie malnutrition, severe   Pneumonia   Hyperkalemia   Leukocytosis, unspecified    Time spent: 35 min    Jerry [name]  [facility] Pager [phone]. If 7PM-7AM, please contact night-coverage at [email or web address], password [facility] 06/13/2013, 3:32 PM  LOS: 1 day

## 2013-06-13 NOTE — Care Management Note (Addendum)
    Page 1 of 1   06/16/2013     2:49:15 PM   CARE MANAGEMENT NOTE 06/16/2013  Patient:  Jerry [name], Jerry [name]   Account Number:  [id number]  Date Initiated:  06/13/2013  Documentation initiated by:  Lanier Clam  Subjective/Objective Assessment:   72 Y/O M ADMITTED W/RESP FAILURE,ASCITES.READMIT-12/15-12/16/14.     Action/Plan:   FROM HOME W/SPOUSE.ACTIVE W/[facility].HAS HOSP BED,OVERBED TABLE,RW,W/C.   Anticipated DC Date:  06/17/2013   Anticipated DC Plan:  HOME W HOSPICE CARE      DC Planning Services  CM consult      [facility] Choice  Resumption Of Svcs/PTA Provider   Choice offered to / List presented to:             Status of service:  In process, will continue to follow Medicare Important Message given?   (If response is "NO", the following Medicare IM given date fields will be blank) Date Medicare IM given:   Date Additional Medicare IM given:    Discharge Disposition:    Per UR Regulation:  Reviewed for med. necessity/level of care/duration of stay  If discussed at Long Length of Stay Meetings, dates discussed:    Comments:  06/16/13 Lisbet Busker RN,BSN NCM [phone] FOR PARACENTESIS.HYPONATREMIA.D/C PLAN HOME W/RESUMPTION OF [facility],THEY ARE ALREADY FOLLOWING.  06/13/13 Hadyn Azer RN,BSN NCM [phone] ACTIVE W/[facility],THEY ARE AWARE & FOLLOWING FOR RESUMPTION OF HOME HOSPICE SERVICES @ D/C.FAMILY ABLE TO TRANSPORT HOME.

## 2013-06-13 NOTE — Progress Notes (Signed)
[facility] ([facility]) Sw note:  Pt in bed, wife at bedside feeding Pt yogurt. Pt appeared alert, but did not engage in conversation with Sw. Per wife she is unsure how long Pt will be in the hospital and what the plan will be for Pt while he is here, wife stated "we are just waiting to see what they are going to do." Sw provided active/reflective listening during visit as wife talked about Pt's hospital admission. Sw will continue to follow for support for Pt/family during hospital stay.  Lorain Childes, [place], ACHP-SW [facility] # [phone]

## 2013-06-13 NOTE — Progress Notes (Addendum)
Inpatient Diabetes Program Recommendations  AACE/ADA: New Consensus Statement on Inpatient Glycemic Control (2013)  Target Ranges:  Prepandial:   less than 140 mg/dL      Peak postprandial:   less than 180 mg/dL (1-2 hours)      Critically ill patients:  140 - 180 mg/dL   Consistent glucose in 200 range.  Results for Jerry [name], Jerry [name] (MRN [id number]) as of 06/13/2013 16:25  Ref. Range 06/12/2013 15:57 06/12/2013 16:08 06/12/2013 21:15 06/12/2013 23:21 06/13/2013 05:00 06/13/2013 07:39 06/13/2013 11:29  Glucose-Capillary Latest Range: 70-99 mg/dL 782 (H)  956 (H)   213 (H) 282 (H)   Inpatient Diabetes Program Recommendations Insulin - Basal: Please consider adding some Lantus while here as he is consistently running n 200 range.  Start with 10 units lantus daily. Correction (SSI): xxx  Thank you, Lenor Coffin, RN, CNS, Diabetes Coordinator [phone])

## 2013-06-13 NOTE — Progress Notes (Signed)
INITIAL NUTRITION ASSESSMENT  DOCUMENTATION CODES Per approved criteria  -Not Applicable   INTERVENTION: Provide Glucerna Shake BID  Provide Boost Plus once daily  Provide a snack once daily  Encourage PO intake   NUTRITION DIAGNOSIS: Inadequate oral intake related to varied appetite as evidenced by 18% weight loss in less than 2 months.    Goal: Pt to meet >/= 90% of their estimated nutrition needs   Monitor:  PO intake Weight Labs  Reason for Assessment: Malnutrition Screening Tool, score of 5  72 y.o. male  Admitting Dx: Acute respiratory failure  ASSESSMENT: 72 yo male with complicated and complex medical history including liver cirrhosis secondary to NASH and requiring frequent paracentesis due to rapidly re accumulating ascites, last one done 06/10/2013 and 2.9 L removed, HTN, HLD, DM with last HgA1C 7.1 (06/02/2013), CKD stage II -III, with baseline Cr 1.2 - 1.6, Anemia of chronic disease with baseline hg ~11. He is now presenting to [facility] ED with main concern of several days duration of progressively worsening altered mental status associated with exertional dyspnea as well as dyspnea at rest, subjective fevers, chills, cough productive of clear sputum.   Pt asleep at time of visit; RD familiar with pt and wife from previous admission. Wife reports pt is eating fairly well today but, PTA his appetite varied greatly. He has been drinking Glucerna supplements at home. Pt's weight has dropped an additional 3 lbs in the 8 days. Pt's usual weight is 220 lbs ad he typically eats 2 meals and one snack daily plus nutritional supplements.   Height: Ht Readings from Last 1 Encounters:  06/12/13 5\' 9"  (1.753 m)    Weight: Wt Readings from Last 1 Encounters:  06/13/13 176 lb 12.9 oz (80.2 kg)    Ideal Body Weight: 160 lbs  % Ideal Body Weight: 110%  Wt Readings from Last 10 Encounters:  06/13/13 176 lb 12.9 oz (80.2 kg)  06/05/13 179 lb (81.194 kg)  06/03/13 179 lb 11.2  oz (81.511 kg)  05/14/13 203 lb 0.7 oz (92.1 kg)  04/09/13 217 lb 1.6 oz (98.476 kg)    Usual Body Weight: 220 lbs  % Usual Body Weight: 80%  BMI:  Body mass index is 26.1 kg/(m^2).  Estimated Nutritional Needs: Kcal: 2000-2200  Protein: 95-105 grams  Fluid: 2-2.2 L/day  Skin: intact  Diet Order: General  EDUCATION NEEDS: -No education needs identified at this time   Intake/Output Summary (Last 24 hours) at 06/13/13 1529 Last data filed at 06/13/13 1420  Gross per 24 hour  Intake    475 ml  Output     81 ml  Net    394 ml    Last BM: 12/25  Labs:   Recent Labs Lab 06/12/13 1056 06/12/13 1608 06/13/13 0500  NA 116* 118* 119*  K 5.2* 4.8 5.2*  CL 82* 84* 85*  CO2 21 20 22   BUN 52* 55* 54*  CREATININE 1.43* 1.44* 1.43*  CALCIUM 9.1 9.3 9.2  MG 3.0*  --   --   GLUCOSE 253* 331* 268*    CBG (last 3)   Recent Labs  06/12/13 2115 06/13/13 0739 06/13/13 1129  GLUCAP 264* 242* 282*    Scheduled Meds: . ceFEPime (MAXIPIME) IV  2 g Intravenous Q24H  . enoxaparin (LOVENOX) injection  30 mg Subcutaneous Q24H  . insulin aspart  0-9 Units Subcutaneous TID WC  . lactose free nutrition  237 mL Oral Daily  . lactulose  10 g Oral TID  .  linagliptin  5 mg Oral Daily  . pantoprazole  40 mg Oral Daily  . sodium chloride  3 mL Intravenous Q12H  . vancomycin  750 mg Intravenous Q12H    Continuous Infusions:   Past Medical History  Diagnosis Date  . Diabetes mellitus without complication   . Hypertension   . Cirrhosis, non-alcoholic     October 2014 diagnosed    Past Surgical History  Procedure Laterality Date  . Back surgery    . Hernia repair      Ian Malkin RD, LDN Inpatient Clinical Dietitian Pager: [phone] After Hours Pager: [phone]

## 2013-06-14 ENCOUNTER — Inpatient Hospital Stay (HOSPITAL_COMMUNITY)

## 2013-06-14 DIAGNOSIS — N179 Acute kidney failure, unspecified: Secondary | ICD-10-CM

## 2013-06-14 DIAGNOSIS — E871 Hypo-osmolality and hyponatremia: Secondary | ICD-10-CM

## 2013-06-14 DIAGNOSIS — R188 Other ascites: Secondary | ICD-10-CM

## 2013-06-14 LAB — BASIC METABOLIC PANEL
BUN: 53 mg/dL — ABNORMAL HIGH (ref 6–23)
Chloride: 81 mEq/L — ABNORMAL LOW (ref 96–112)
Creatinine, Ser: 1.41 mg/dL — ABNORMAL HIGH (ref 0.50–1.35)
GFR calc Af Amer: 55 mL/min — ABNORMAL LOW (ref >90)
GFR calc non Af Amer: 48 mL/min — ABNORMAL LOW (ref >90)
Sodium: 115 mEq/L — CL (ref 135–145)

## 2013-06-14 LAB — CBC WITH DIFFERENTIAL/PLATELET
Basophils Absolute: 0 10*3/uL (ref 0.0–0.1)
Basophils Relative: 0 % (ref 0–1)
Eosinophils Absolute: 0.1 10*3/uL (ref 0.0–0.7)
HCT: 41.1 % (ref 39.0–52.0)
Lymphocytes Relative: 15 % (ref 12–46)
MCHC: 35.5 g/dL (ref 30.0–36.0)
Monocytes Absolute: 1.1 10*3/uL — ABNORMAL HIGH (ref 0.1–1.0)
Neutro Abs: 10.5 10*3/uL — ABNORMAL HIGH (ref 1.7–7.7)
Platelets: 137 10*3/uL — ABNORMAL LOW (ref 150–400)
RDW: 15.9 % — ABNORMAL HIGH (ref 11.5–15.5)
WBC: 13.8 10*3/uL — ABNORMAL HIGH (ref 4.0–10.5)

## 2013-06-14 LAB — SODIUM, URINE, RANDOM: Sodium, Ur: <15 mEq/L

## 2013-06-14 LAB — OSMOLALITY, URINE: Osmolality, Ur: 565 mOsm/kg (ref 390–1090)

## 2013-06-14 LAB — GLUCOSE, CAPILLARY: Glucose-Capillary: 297 mg/dL — ABNORMAL HIGH (ref 70–99)

## 2013-06-14 MED ORDER — INSULIN GLARGINE 100 UNIT/ML ~~LOC~~ SOLN
10.0000 [IU] | Freq: Every day | SUBCUTANEOUS | Status: DC
  Administered 2013-06-14 – 2013-06-15 (×2): 10 [IU] via SUBCUTANEOUS
  Filled 2013-06-14 (×3): qty 0.1

## 2013-06-14 MED ORDER — ENOXAPARIN SODIUM 40 MG/0.4ML ~~LOC~~ SOLN
40.0000 mg | SUBCUTANEOUS | Status: DC
  Administered 2013-06-14 – 2013-06-21 (×8): 40 mg via SUBCUTANEOUS
  Filled 2013-06-14 (×9): qty 0.4

## 2013-06-14 NOTE — Progress Notes (Signed)
[facility] PROGRESS NOTE  Jerry [name] ZOX:[id number] DOB: 03/25/1941 DOA: 06/12/2013 PCP: Thora Lance, MD  Assessment/Plan:  Acute respiratory failure  - based on symptoms and CXR findings mostly consistent with PNA - will admit to telemetry bed and will start with treating with broad spectrum ABX to cover for HCAP, Vancomycin and Maxipime  - will also obtain sputum analysis and urine legionella and strep pneumo  - provide oxygen as needed, BD's PRN  Active Problems:  Ascites  - status post paracentesis 06/10/2013  - 2.9 L removed and pt has tolerated well  Acute on chronic renal failure  - likely secondary to pre renal etiology, recent paracentesis 12/23  - monitor I's and O's, daily weights  - repeat BMP in AM  Hyponatremia  - likely pre renal as well imposed on chronic hyponatremia  - will give small NS bolus as noted above and will repeat BMP in AM  Pneumonia, HCAP  - will treat as HCAP given recent hospitalization, discharged 06/03/2014  - sputum analysis ordered and will need to follow up on results  - Vancomycin and Maxipime ordered  Leukocytosis, unspecified  - secondary to HCAP as noted above  - ABX and repeat CBC in AM  Transaminitis  - secondary to NAFLD (nonalcoholic fatty liver disease)  Diabetes mellitus, type II  - recent A1C 7.1  - continue Januvia  - place on SSI as well  Hepatic encephalopathy  - ammonia 86 on admission  - continue Lactulose, place on Rifaximin  - repeat ammonia in AM  Hyperkalemia  - mild, repeat BMP  In am. One dose of kayexalate given.  - monitor on telemetry  Protein-calorie malnutrition, severe  - nutrition consultation  Code Status: Full  Family Communication: Pt at bedside  Disposition Plan: Admit to telemetry bed     Consultants:none  Procedures: none  Antibiotics: Vancomycin 12/25 Cefepime 12/25  HPI/Subjective: Reports he was not feeling good.  Objective: Filed Vitals:   06/14/13 0524  BP:  111/69  Pulse: 93  Temp: 98.2 F (36.8 C)  Resp: 20    Intake/Output Summary (Last 24 hours) at 06/14/13 1556 Last data filed at 06/14/13 0852  Gross per 24 hour  Intake    480 ml  Output      2 ml  Net    478 ml   Filed Weights   06/12/13 0909 06/13/13 0507 06/14/13 0524  Weight: 79 kg (174 lb 2.6 oz) 80.2 kg (176 lb 12.9 oz) 80.5 kg (177 lb 7.5 oz)    Exam:   General:  Alert afebrile comfortable  Cardiovascular: s1s2  Respiratory: scattered rhonchi.   Abdomen: soft, distended, mild tenderness bs+  Musculoskeletal: pedal edema present.   Data Reviewed: Basic Metabolic Panel:  Recent Labs Lab 06/12/13 0508 06/12/13 1056 06/12/13 1608 06/13/13 0500 06/14/13 0840  NA 119* 116* 118* 119* 115*  K 5.5* 5.2* 4.8 5.2* 4.3  CL 83* 82* 84* 85* 81*  CO2 22 21 20 22 23   GLUCOSE 274* 253* 331* 268* 292*  BUN 56* 52* 55* 54* 53*  CREATININE 1.53* 1.43* 1.44* 1.43* 1.41*  CALCIUM 9.4 9.1 9.3 9.2 9.2  MG  --  3.0*  --   --   --    Liver Function Tests:  Recent Labs Lab 06/12/13 0508 06/12/13 1056 06/13/13 0500  AST 72* 60* 64*  ALT 76* 69* 69*  ALKPHOS 285* 259* 262*  BILITOT 2.6* 2.6* 3.0*  PROT 6.8 6.5 6.2  ALBUMIN 3.6 3.3* 3.2*  No results found for this basename: LIPASE, AMYLASE,  in the last 168 hours  Recent Labs Lab 06/12/13 0508 06/13/13 0500  AMMONIA 86* 73*   CBC:  Recent Labs Lab 06/12/13 0508 06/13/13 0500 06/14/13 0840  WBC 16.4* 14.7* 13.8*  NEUTROABS 14.1*  --  10.5*  HGB 14.8 14.6 14.6  HCT 40.5 40.4 41.1  MCV 88.4 88.6 90.1  PLT 133* 129* 137*   Cardiac Enzymes: No results found for this basename: CKTOTAL, CKMB, CKMBINDEX, TROPONINI,  in the last 168 hours BNP (last 3 results)  Recent Labs  04/06/13 0518 04/07/13 0500 06/12/13 1056  PROBNP 73.5 66.0 447.5*   CBG:  Recent Labs Lab 06/13/13 1129 06/13/13 1649 06/13/13 2053 06/14/13 0803 06/14/13 1201  GLUCAP 282* 326* 287* 214* 297*    Recent Results (from  the past 240 hour(s))  CULTURE, BLOOD (ROUTINE X 2)     Status: None   Collection Time    06/12/13 10:56 AM      Result Value Range Status   Specimen Description BLOOD RIGHT HAND   Final   Special Requests     Final   Value: BOTTLES DRAWN AEROBIC AND ANAEROBIC 5CC BOTH BOTTLES   Culture  Setup Time     Final   Value: 06/12/2013 14:52     Performed at [facility]   Culture     Final   Value:        BLOOD CULTURE RECEIVED NO GROWTH TO DATE CULTURE WILL BE HELD FOR 5 DAYS BEFORE ISSUING A FINAL NEGATIVE REPORT     Performed at [facility]   Report Status PENDING   Incomplete  CULTURE, BLOOD (ROUTINE X 2)     Status: None   Collection Time    06/12/13 11:00 AM      Result Value Range Status   Specimen Description BLOOD LEFT ARM   Final   Special Requests BOTTLES DRAWN AEROBIC ONLY 3CC   Final   Culture  Setup Time     Final   Value: 06/12/2013 14:52     Performed at [facility]   Culture     Final   Value:        BLOOD CULTURE RECEIVED NO GROWTH TO DATE CULTURE WILL BE HELD FOR 5 DAYS BEFORE ISSUING A FINAL NEGATIVE REPORT     Performed at [facility]   Report Status PENDING   Incomplete     Studies: No results found.  Scheduled Meds: . ceFEPime (MAXIPIME) IV  2 g Intravenous Q24H  . enoxaparin (LOVENOX) injection  40 mg Subcutaneous Q24H  . feeding supplement (GLUCERNA SHAKE)  237 mL Oral BID BM  . insulin aspart  0-9 Units Subcutaneous TID WC  . insulin glargine  10 Units Subcutaneous Daily  . lactose free nutrition  237 mL Oral Daily  . lactulose  10 g Oral TID  . linagliptin  5 mg Oral Daily  . pantoprazole  40 mg Oral Daily  . sodium chloride  3 mL Intravenous Q12H  . vancomycin  750 mg Intravenous Q12H   Continuous Infusions:   Principal Problem:   Acute respiratory failure Active Problems:   Ascites   Acute on chronic renal failure   Transaminitis   Hyponatremia   NAFLD (nonalcoholic fatty liver disease)   Diabetes  mellitus, type II   Hepatic encephalopathy   Protein-calorie malnutrition, severe   Pneumonia   Hyperkalemia   Leukocytosis, unspecified    Time spent:  35 min    Jerry [name]  [facility] Pager [phone]. If 7PM-7AM, please contact night-coverage at [email or web address], password [facility] 06/14/2013, 3:56 PM  LOS: 2 days

## 2013-06-14 NOTE — Progress Notes (Signed)
Related admission. Patient sleeping at this time.  Wife at bedside.  Wife with no concerns or needs at this time. Chart reviewed and spoke with Dwana Curd, RN for patient.  Please contact [facility] [phone] with any questions or concerns.  Willette Pa, RN [facility]

## 2013-06-15 LAB — RESPIRATORY VIRUS PANEL
Adenovirus: NOT DETECTED
Influenza A H3: NOT DETECTED
Influenza B: NOT DETECTED
Metapneumovirus: NOT DETECTED
Parainfluenza 1: NOT DETECTED
Parainfluenza 2: NOT DETECTED
Parainfluenza 3: NOT DETECTED
Respiratory Syncytial Virus B: NOT DETECTED
Rhinovirus: NOT DETECTED

## 2013-06-15 LAB — CBC
HCT: 40.8 % (ref 39.0–52.0)
Hemoglobin: 14.5 g/dL (ref 13.0–17.0)
MCH: 31.7 pg (ref 26.0–34.0)
MCV: 89.1 fL (ref 78.0–100.0)
Platelets: 142 10*3/uL — ABNORMAL LOW (ref 150–400)
RDW: 15.7 % — ABNORMAL HIGH (ref 11.5–15.5)
WBC: 14.6 10*3/uL — ABNORMAL HIGH (ref 4.0–10.5)

## 2013-06-15 LAB — BASIC METABOLIC PANEL
CO2: 21 mEq/L (ref 19–32)
Calcium: 8.9 mg/dL (ref 8.4–10.5)
Chloride: 80 mEq/L — ABNORMAL LOW (ref 96–112)
Creatinine, Ser: 1.54 mg/dL — ABNORMAL HIGH (ref 0.50–1.35)
Glucose, Bld: 229 mg/dL — ABNORMAL HIGH (ref 70–99)
Potassium: 4.2 mEq/L (ref 3.5–5.1)

## 2013-06-15 LAB — GLUCOSE, CAPILLARY
Glucose-Capillary: 345 mg/dL — ABNORMAL HIGH (ref 70–99)
Glucose-Capillary: 389 mg/dL — ABNORMAL HIGH (ref 70–99)

## 2013-06-15 LAB — VANCOMYCIN, TROUGH: Vancomycin Tr: 26.3 ug/mL (ref 10.0–20.0)

## 2013-06-15 LAB — SODIUM: Sodium: 116 mEq/L — CL (ref 135–145)

## 2013-06-15 LAB — OSMOLALITY: Osmolality: 279 mOsm/kg (ref 275–300)

## 2013-06-15 MED ORDER — INSULIN ASPART 100 UNIT/ML ~~LOC~~ SOLN: 0.0000 [IU] | Freq: Three times a day (TID) | SUBCUTANEOUS | Status: DC

## 2013-06-15 MED ORDER — INSULIN ASPART 100 UNIT/ML ~~LOC~~ SOLN
0.0000 [IU] | Freq: Three times a day (TID) | SUBCUTANEOUS | Status: DC
  Administered 2013-06-15: 15 [IU] via SUBCUTANEOUS
  Administered 2013-06-16 (×3): 3 [IU] via SUBCUTANEOUS
  Administered 2013-06-17: 2 [IU] via SUBCUTANEOUS
  Administered 2013-06-17: 8 [IU] via SUBCUTANEOUS
  Administered 2013-06-17: 2 [IU] via SUBCUTANEOUS
  Administered 2013-06-18: 8 [IU] via SUBCUTANEOUS
  Administered 2013-06-18: 5 [IU] via SUBCUTANEOUS
  Administered 2013-06-18: 3 [IU] via SUBCUTANEOUS

## 2013-06-15 MED ORDER — SODIUM CHLORIDE 0.9 % IV SOLN
INTRAVENOUS | Status: DC
  Administered 2013-06-15 – 2013-06-16 (×3): via INTRAVENOUS

## 2013-06-15 MED ORDER — INSULIN GLARGINE 100 UNIT/ML ~~LOC~~ SOLN
15.0000 [IU] | Freq: Every day | SUBCUTANEOUS | Status: DC
  Administered 2013-06-16 – 2013-06-18 (×3): 15 [IU] via SUBCUTANEOUS
  Filled 2013-06-15 (×4): qty 0.15

## 2013-06-15 MED ORDER — LEVOFLOXACIN 500 MG PO TABS
500.0000 mg | ORAL_TABLET | Freq: Every day | ORAL | Status: DC
  Administered 2013-06-15: 18:00:00 500 mg via ORAL
  Filled 2013-06-15 (×2): qty 1

## 2013-06-15 MED ORDER — PANTOPRAZOLE SODIUM 40 MG PO TBEC
40.0000 mg | DELAYED_RELEASE_TABLET | Freq: Two times a day (BID) | ORAL | Status: DC
  Administered 2013-06-15 – 2013-06-25 (×16): 40 mg via ORAL
  Filled 2013-06-15 (×23): qty 1

## 2013-06-15 MED ORDER — VANCOMYCIN HCL IN DEXTROSE 1-5 GM/200ML-% IV SOLN
1000.0000 mg | INTRAVENOUS | Status: DC
  Filled 2013-06-15: qty 200

## 2013-06-15 NOTE — Progress Notes (Signed)
CRITICAL VALUE ALERT  Critical value received:  Sodium 116  Date of notification:  06/15/13  Time of notification:  1830  Critical value read back:yes  Nurse who received alert:  Minna Merritts   MD notified (1st page):  Dr. Blake Divine  Time of first page:  1843  MD notified (2nd page): Dr. Blake Divine  Time of second page:1855  Responding MD:  None   Time MD responded: None

## 2013-06-15 NOTE — Progress Notes (Signed)
[facility] PROGRESS NOTE  Lionel Woodberry AVW:[id number] DOB: November 24, 1940 DOA: 06/12/2013 PCP: Thora Lance, MD  Assessment/Plan:  Acute respiratory failure  - based on symptoms and CXR findings mostly consistent with PNA - he was admitted to telemetry and started on broad spectrum antibiotics. Repeat cxr shows improvement and his antibiotics were transitioned to oral levaquin.   - urine legionella and strep pneumo negative.  - provide oxygen as needed, BD's PRN  Active Problems:  Ascites  - status post paracentesis 06/10/2013  - 2.9 L removed and pt has tolerated well  Acute on chronic renal failure  - likely secondary to pre renal etiology, recent paracentesis 12/23  - monitor I's and O's, daily weights  - repeat BMP in AM shows stable creatinine.  Hyponatremia  - likely pre renal as well imposed on chronic hyponatremia  - fluids started. There was slight improvement in the sodium.  Pneumonia, HCAP  - transitioned to oral levaquin  Leukocytosis, unspecified  - secondary to HCAP as noted above  - ABX and repeat CBC in AM  Transaminitis  - secondary to NAFLD (nonalcoholic fatty liver disease)  Diabetes mellitus, type II  - recent A1C 7.1  - continue Januvia  - place on SSI as well  Hepatic encephalopathy  - ammonia 86 on admission  - continue Lactulose, place on Rifaximin   Hyperkalemia  - mild, repeat BMP  In am. One dose of kayexalate given.  - resolved.  Protein-calorie malnutrition, severe  - nutrition consultation  Code Status: Full  Family Communication: family at bedside, discussed thep lan of care with the patient.  Disposition Plan: possible d/c in am after paracentesis.     Consultants:none  Procedures: none  Antibiotics: Vancomycin 12/25- 12/28 Cefepime 12/25- 12/28 levaquin 12/28.   HPI/Subjective: Feeling much better today. Wants to go home.   Objective: Filed Vitals:   06/15/13 1401  BP: 120/70  Pulse: 87  Temp: 97.7 F (36.5  C)  Resp: 16    Intake/Output Summary (Last 24 hours) at 06/15/13 1937 Last data filed at 06/15/13 1900  Gross per 24 hour  Intake 1818.75 ml  Output      0 ml  Net 1818.75 ml   Filed Weights   06/13/13 0507 06/14/13 0524 06/15/13 0526  Weight: 80.2 kg (176 lb 12.9 oz) 80.5 kg (177 lb 7.5 oz) 82.5 kg (181 lb 14.1 oz)    Exam:   General:  Alert afebrile comfortable  Cardiovascular: s1s2  Respiratory: scattered rhonchi.   Abdomen: soft, distended, mild tenderness bs+  Musculoskeletal: pedal edema present.   Data Reviewed: Basic Metabolic Panel:  Recent Labs Lab 06/12/13 1056 06/12/13 1608 06/13/13 0500 06/14/13 0840 06/15/13 0530 06/15/13 1802  NA 116* 118* 119* 115* 114* 116*  K 5.2* 4.8 5.2* 4.3 4.2  --   CL 82* 84* 85* 81* 80*  --   CO2 21 20 22 23 21   --   GLUCOSE 253* 331* 268* 292* 229*  --   BUN 52* 55* 54* 53* 57*  --   CREATININE 1.43* 1.44* 1.43* 1.41* 1.54*  --   CALCIUM 9.1 9.3 9.2 9.2 8.9  --   MG 3.0*  --   --   --   --   --    Liver Function Tests:  Recent Labs Lab 06/12/13 0508 06/12/13 1056 06/13/13 0500  AST 72* 60* 64*  ALT 76* 69* 69*  ALKPHOS 285* 259* 262*  BILITOT 2.6* 2.6* 3.0*  PROT 6.8 6.5 6.2  ALBUMIN 3.6 3.3* 3.2*   No results found for this basename: LIPASE, AMYLASE,  in the last 168 hours  Recent Labs Lab 06/12/13 0508 06/13/13 0500  AMMONIA 86* 73*   CBC:  Recent Labs Lab 06/12/13 0508 06/13/13 0500 06/14/13 0840 06/15/13 0530  WBC 16.4* 14.7* 13.8* 14.6*  NEUTROABS 14.1*  --  10.5*  --   HGB 14.8 14.6 14.6 14.5  HCT 40.5 40.4 41.1 40.8  MCV 88.4 88.6 90.1 89.1  PLT 133* 129* 137* 142*   Cardiac Enzymes: No results found for this basename: CKTOTAL, CKMB, CKMBINDEX, TROPONINI,  in the last 168 hours BNP (last 3 results)  Recent Labs  04/06/13 0518 04/07/13 0500 06/12/13 1056  PROBNP 73.5 66.0 447.5*   CBG:  Recent Labs Lab 06/14/13 1201 06/14/13 1709 06/14/13 2206 06/15/13 1143  06/15/13 1708  GLUCAP 297* 269* 293* 345* 389*    Recent Results (from the past 240 hour(s))  CULTURE, BLOOD (ROUTINE X 2)     Status: None   Collection Time    06/12/13 10:56 AM      Result Value Range Status   Specimen Description BLOOD RIGHT HAND   Final   Special Requests     Final   Value: BOTTLES DRAWN AEROBIC AND ANAEROBIC 5CC BOTH BOTTLES   Culture  Setup Time     Final   Value: 06/12/2013 14:52     Performed at [facility]   Culture     Final   Value:        BLOOD CULTURE RECEIVED NO GROWTH TO DATE CULTURE WILL BE HELD FOR 5 DAYS BEFORE ISSUING A FINAL NEGATIVE REPORT     Performed at [facility]   Report Status PENDING   Incomplete  CULTURE, BLOOD (ROUTINE X 2)     Status: None   Collection Time    06/12/13 11:00 AM      Result Value Range Status   Specimen Description BLOOD LEFT ARM   Final   Special Requests BOTTLES DRAWN AEROBIC ONLY 3CC   Final   Culture  Setup Time     Final   Value: 06/12/2013 14:52     Performed at [facility]   Culture     Final   Value:        BLOOD CULTURE RECEIVED NO GROWTH TO DATE CULTURE WILL BE HELD FOR 5 DAYS BEFORE ISSUING A FINAL NEGATIVE REPORT     Performed at [facility]   Report Status PENDING   Incomplete     Studies: Dg Chest 2 View  06/14/2013   CLINICAL DATA:  Cough.  FOLLOW UP pneumonia.  EXAM: CHEST  2 VIEW  COMPARISON:  06/12/2013  FINDINGS: Mild basilar lung opacity is noted, similar on the left and mildly increased on the right, most consistent with atelectasis. No convincing pneumonia. Lung volumes are low accentuating the lung base opacity.  No pulmonary edema.  No pleural effusion or pneumothorax.  The heart, mediastinum and hila are unremarkable.  IMPRESSION: Mild lung base opacity as described most consistent with atelectasis.   Electronically Signed   By: Amie Portland M.D.   On: 06/14/2013 17:37    Scheduled Meds: . enoxaparin (LOVENOX) injection  40 mg Subcutaneous Q24H  .  feeding supplement (GLUCERNA SHAKE)  237 mL Oral BID BM  . insulin aspart  0-15 Units Subcutaneous TID WC  . insulin glargine  10 Units Subcutaneous Daily  . lactose free nutrition  237 mL  Oral Daily  . lactulose  10 g Oral TID  . levofloxacin  500 mg Oral Daily  . linagliptin  5 mg Oral Daily  . pantoprazole  40 mg Oral BID AC  . sodium chloride  3 mL Intravenous Q12H   Continuous Infusions: . sodium chloride 75 mL/hr at 06/15/13 1610    Principal Problem:   Acute respiratory failure Active Problems:   Ascites   Acute on chronic renal failure   Transaminitis   Hyponatremia   NAFLD (nonalcoholic fatty liver disease)   Diabetes mellitus, type II   Hepatic encephalopathy   Protein-calorie malnutrition, severe   Pneumonia   Hyperkalemia   Leukocytosis, unspecified    Time spent: 35 min    Renell Coaxum  [facility] Pager [phone]. If 7PM-7AM, please contact night-coverage at [email or web address], password [email or web address] 06/15/2013, 7:37 PM  LOS: 3 days

## 2013-06-15 NOTE — Progress Notes (Signed)
CRITICAL VALUE ALERT  Critical value received:  Vancomycin 26.3  Date of notification:  06/15/13  Time of notification: 0623  Critical value read back:yes  Nurse who received alert:  Radene Knee   MD notified (1st page):  Claiborne Billings   Time of first page:  0626  MD notified (2nd page):   Time of second page:  Responding MD:  Claiborne Billings  Time MD responded:  [phone]

## 2013-06-15 NOTE — Progress Notes (Signed)
Patient  Sodium level 116 higher than what it was this Am, notified Dr. Blake Divine via text page. No call back and not new order written. Reported off to night shift RN.

## 2013-06-15 NOTE — Progress Notes (Signed)
ANTIBIOTIC CONSULT NOTE - FOLLOW UP  Pharmacy Consult for Vancomycin Indication: pneumonia  No Known Allergies  Patient Measurements: Height: 5\' 9"  (175.3 cm) Weight: 181 lb 14.1 oz (82.5 kg) IBW/kg (Calculated) : 70.7 Adjusted Body Weight:   Vital Signs: Temp: 98 F (36.7 C) (12/28 0526) Temp src: Oral (12/28 0526) BP: 141/88 mmHg (12/28 0526) Pulse Rate: 90 (12/28 0526) Intake/Output from previous day: 12/27 0701 - 12/28 0700 In: 560 [P.O.:560] Out: -  Intake/Output from this shift:    Labs:  Recent Labs  06/13/13 0500 06/14/13 0840 06/14/13 1214 06/15/13 0530  WBC 14.7* 13.8*  --  14.6*  HGB 14.6 14.6  --  14.5  PLT 129* 137*  --  142*  LABCREA  --   --  157.4  --   CREATININE 1.43* 1.41*  --  1.54*   Estimated Creatinine Clearance: 42.1 ml/min (by C-G formula based on Cr of 1.54).  Recent Labs  06/15/13 0530  VANCOTROUGH 26.3*     Microbiology: Recent Results (from the past 720 hour(s))  CULTURE, BLOOD (ROUTINE X 2)     Status: None   Collection Time    06/12/13 10:56 AM      Result Value Range Status   Specimen Description BLOOD RIGHT HAND   Final   Special Requests     Final   Value: BOTTLES DRAWN AEROBIC AND ANAEROBIC 5CC BOTH BOTTLES   Culture  Setup Time     Final   Value: 06/12/2013 14:52     Performed at [facility]   Culture     Final   Value:        BLOOD CULTURE RECEIVED NO GROWTH TO DATE CULTURE WILL BE HELD FOR 5 DAYS BEFORE ISSUING A FINAL NEGATIVE REPORT     Performed at [facility]   Report Status PENDING   Incomplete  CULTURE, BLOOD (ROUTINE X 2)     Status: None   Collection Time    06/12/13 11:00 AM      Result Value Range Status   Specimen Description BLOOD LEFT ARM   Final   Special Requests BOTTLES DRAWN AEROBIC ONLY 3CC   Final   Culture  Setup Time     Final   Value: 06/12/2013 14:52     Performed at [facility]   Culture     Final   Value:        BLOOD CULTURE RECEIVED NO GROWTH TO DATE  CULTURE WILL BE HELD FOR 5 DAYS BEFORE ISSUING A FINAL NEGATIVE REPORT     Performed at [facility]   Report Status PENDING   Incomplete    Anti-infectives   Start     Dose/Rate Route Frequency Ordered Stop   06/15/13 1800  vancomycin (VANCOCIN) IVPB 1000 mg/200 mL premix     1,000 mg 200 mL/hr over 60 Minutes Intravenous Every 24 hours 06/15/13 0643     06/12/13 1800  vancomycin (VANCOCIN) IVPB 750 mg/150 ml premix  Status:  Discontinued     750 mg 150 mL/hr over 60 Minutes Intravenous Every 12 hours 06/12/13 1624 06/15/13 0628   06/12/13 1700  ceFEPIme (MAXIPIME) 2 g in dextrose 5 % 50 mL IVPB     2 g 100 mL/hr over 30 Minutes Intravenous Every 24 hours 06/12/13 1624     06/12/13 1600  cefTRIAXone (ROCEPHIN) 1 g in dextrose 5 % 50 mL IVPB  Status:  Discontinued     1 g 100 mL/hr  over 30 Minutes Intravenous Every 24 hours 06/12/13 1011 06/12/13 1529   06/12/13 1200  azithromycin (ZITHROMAX) tablet 500 mg  Status:  Discontinued     500 mg Oral Daily 06/12/13 1011 06/12/13 1529   06/12/13 0545  vancomycin (VANCOCIN) IVPB 1000 mg/200 mL premix     1,000 mg 200 mL/hr over 60 Minutes Intravenous  Once 06/12/13 0535 06/12/13 0724   06/12/13 0545  piperacillin-tazobactam (ZOSYN) IVPB 3.375 g     3.375 g 100 mL/hr over 30 Minutes Intravenous  Once 06/12/13 0535 06/12/13 0802      Assessment: Patient with high vancomycin level.  AM dose NOT given after level drawn per RN.  Goal of Therapy:  Vancomycin trough level 15-20 mcg/ml  Plan:  Measure antibiotic drug levels at steady state Follow up culture results Change vancomycin to 1gm iv q24hr, next dose at [address], [place] 06/15/2013,6:43 AM

## 2013-06-15 NOTE — Progress Notes (Signed)
Pt alert and stating that he wants to go home.  Wife and daughter at bedside.  Wife states that pt is scheduled for a procedure tomorrow.  Plan is for pt to return home once he is ready for discharge.  Hospice to continue. Wynonia Hazard, LCSW

## 2013-06-15 NOTE — Progress Notes (Signed)
CRITICAL VALUE ALERT  Critical value received:  Sodium 114  Date of notification:  06/15/13  Time of notification:  0633  Critical value read back:yes  Nurse who received alert:  Radene Knee   MD notified (1st page):  Claiborne Billings  Time of first page:  [phone]   MD notified (2nd page):  Time of second page:  Responding MD:  Claiborne Billings  Time MD responded:  [phone]

## 2013-06-16 ENCOUNTER — Inpatient Hospital Stay (HOSPITAL_COMMUNITY)

## 2013-06-16 DIAGNOSIS — E871 Hypo-osmolality and hyponatremia: Secondary | ICD-10-CM

## 2013-06-16 LAB — BASIC METABOLIC PANEL
BUN: 61 mg/dL — ABNORMAL HIGH (ref 6–23)
BUN: 61 mg/dL — ABNORMAL HIGH (ref 6–23)
CO2: 19 mEq/L (ref 19–32)
CO2: 19 mEq/L (ref 19–32)
Chloride: 82 mEq/L — ABNORMAL LOW (ref 96–112)
Chloride: 83 mEq/L — ABNORMAL LOW (ref 96–112)
Creatinine, Ser: 1.76 mg/dL — ABNORMAL HIGH (ref 0.50–1.35)
Glucose, Bld: 218 mg/dL — ABNORMAL HIGH (ref 70–99)
Glucose, Bld: 171 mg/dL — ABNORMAL HIGH (ref 70–99)
Potassium: 4.6 mEq/L (ref 3.5–5.1)
Potassium: 4.5 mEq/L (ref 3.5–5.1)
Sodium: 116 mEq/L — CL (ref 135–145)

## 2013-06-16 LAB — GLUCOSE, CAPILLARY
Glucose-Capillary: 187 mg/dL — ABNORMAL HIGH (ref 70–99)
Glucose-Capillary: 152 mg/dL — ABNORMAL HIGH (ref 70–99)

## 2013-06-16 LAB — MRSA PCR SCREENING: MRSA by PCR: POSITIVE — AB

## 2013-06-16 LAB — SODIUM: Sodium: 116 mEq/L — CL (ref 135–145)

## 2013-06-16 MED ORDER — ALBUMIN HUMAN 25 % IV SOLN
25.0000 g | Freq: Once | INTRAVENOUS | Status: AC
  Administered 2013-06-16: 16:00:00 25 g via INTRAVENOUS
  Filled 2013-06-16: qty 100

## 2013-06-16 MED ORDER — LEVOFLOXACIN 750 MG PO TABS
750.0000 mg | ORAL_TABLET | ORAL | Status: AC
  Administered 2013-06-17: 750 mg via ORAL
  Filled 2013-06-16 (×3): qty 1

## 2013-06-16 MED ORDER — SODIUM CHLORIDE 3 % IV SOLN
INTRAVENOUS | Status: DC
  Filled 2013-06-16: qty 500

## 2013-06-16 MED ORDER — SODIUM CHLORIDE 0.9 % IJ SOLN
10.0000 mL | Freq: Two times a day (BID) | INTRAMUSCULAR | Status: DC
  Administered 2013-06-17 – 2013-06-18 (×3): 10 mL
  Administered 2013-06-19: 20 mL
  Administered 2013-06-19 – 2013-06-24 (×4): 10 mL

## 2013-06-16 MED ORDER — LACTULOSE 10 GM/15ML PO SOLN
10.0000 g | Freq: Two times a day (BID) | ORAL | Status: DC
  Administered 2013-06-18 (×2): 10 g via ORAL
  Filled 2013-06-16 (×6): qty 15

## 2013-06-16 MED ORDER — ONDANSETRON HCL 4 MG/2ML IJ SOLN
4.0000 mg | Freq: Four times a day (QID) | INTRAMUSCULAR | Status: DC | PRN
  Administered 2013-06-16 – 2013-06-23 (×2): 4 mg via INTRAVENOUS
  Filled 2013-06-16 (×2): qty 2

## 2013-06-16 MED ORDER — SODIUM CHLORIDE 0.9 % IJ SOLN
10.0000 mL | INTRAMUSCULAR | Status: DC | PRN
  Administered 2013-06-20 – 2013-06-22 (×5): 20 mL
  Administered 2013-06-23 – 2013-06-24 (×3): 10 mL
  Administered 2013-06-25 (×2): 20 mL

## 2013-06-16 MED ORDER — SODIUM CHLORIDE 3 % IV SOLN
INTRAVENOUS | Status: AC
  Administered 2013-06-16 – 2013-06-17 (×2): via INTRAVENOUS
  Filled 2013-06-16 (×4): qty 500

## 2013-06-16 NOTE — Progress Notes (Signed)
Peripherally Inserted Central Catheter/Midline Placement  The IV Nurse has discussed with the patient and/or persons authorized to consent for the patient, the purpose of this procedure and the potential benefits and risks involved with this procedure.  The benefits include less needle sticks, lab draws from the catheter and patient may be discharged home with the catheter.  Risks include, but not limited to, infection, bleeding, blood clot (thrombus formation), and puncture of an artery; nerve damage and irregular heat beat.  Alternatives to this procedure were also discussed.  PICC/Midline Placement Documentation  PICC / Midline Double Lumen 06/16/13 PICC Right Brachial 41 cm 0 cm (Active)       Christeen Douglas 06/16/2013, 8:47 PM

## 2013-06-16 NOTE — Progress Notes (Signed)
Inpatient Room 1415, [facility] Nurse Visit.  Pt admitted on 06/12/13 for Emesis, HCAP.  He remains a FULL CODE.   Family at bedside.    Pt lying in bed with eyes closed.  He would open his eyes when writer spoke to him.  Per Pt his breathing is "okay."  He has an intermittent cough that has been on going.  This am he developed nausea.  He was unable to eat his breakfast.  He remains queasy.  Per report from the Caregiver the Pt is suppose to go down for a paracentesis this afternoon.  She questioned whether he will be receiving his usual albumin.  CG reports that if the pt receives the albumin too fast he tends to get sick.  Writer encouraged CG to discuss this with Radiology.    Reviewed chart.  Last VS were 98.1, 88, 18, 101/50.  Abnormal Labs include: Na 114, Chloride-82, BUN-61, Creatinine 1.76, GFR-36, Glucose-218. He has been switched to oral antibiotics.  Pt remains on lactulose for elevated Ammonia level.  Last Ammonia level found was 73 on 12/25.    Pt under Hospice services for Chronic Liver Disease.  This is a hospice related admission.  Please contact [facility] at [phone] with any Hospice concerns.  Elijah Birk RN, [facility] RN, [facility] [place] [phone]

## 2013-06-16 NOTE — Progress Notes (Signed)
[facility] PROGRESS NOTE  Jerry [name] ZOX:[id number] DOB: 1940/11/17 DOA: 06/12/2013 PCP: Thora Lance, MD  Assessment/Plan:  Acute respiratory failure  - based on symptoms and CXR findings mostly consistent with PNA - he was admitted to telemetry and started on broad spectrum antibiotics. Repeat cxr shows improvement and his antibiotics were transitioned to oral levaquin.   - urine legionella and strep pneumo negative.  - provide oxygen as needed, BD's PRN  Active Problems:  Ascites  - status post paracentesis 06/16/2013  - 6.2 L removed and pt has tolerated well and he was given albumin 25 mg . Acute on chronic renal failure  - likely secondary to pre renal etiology, not improving to fluids.  - monitor I's and O's, daily weights  - requested renal consult.  Hyponatremia  - likely pre renal as well imposed on chronic hyponatremia  - fluids started, without any improvement.  - he appears dehydrated clinically, .  - renal consulted and recommended 3 % normal saline and transfer to stepdown.  - ordered PICC line.  Pneumonia, HCAP  - transitioned to oral levaquin  Leukocytosis, unspecified  - secondary to HCAP as noted above  - ABX and repeat CBC in AM  Transaminitis  - secondary to NAFLD (nonalcoholic fatty liver disease)  Diabetes mellitus, type II  - recent A1C 7.1  - continue Januvia  - place on SSI as well  Hepatic encephalopathy  - ammonia 86 on admission  - continue Lactulose, place on Rifaximin   Hyperkalemia  - mild, repeat BMP  In am. One dose of kayexalate given.  - resolved.  Protein-calorie malnutrition, severe  - nutrition consultation   Loose BM: - RULE OUT c diff.   DVT prophylaxis.   Code Status: Full  Family Communication: family at bedside, discussed thep lan of care with the patient and family. .  Disposition Plan: transfer to step down.      Consultants: Renal  Palliative care consult.   Procedures: Paracentesis.     Antibiotics: Vancomycin 12/25- 12/28 Cefepime 12/25- 12/28 levaquin 12/28.   HPI/Subjective: Doesn't feel good. Discussed in detail about the prognosis with the wife, palliative care consulted for goc.    Objective: Filed Vitals:   06/16/13 1724  BP: 129/70  Pulse: 82  Temp: 97.8 F (36.6 C)  Resp: 16    Intake/Output Summary (Last 24 hours) at 06/16/13 1838 Last data filed at 06/16/13 0944  Gross per 24 hour  Intake 1208.75 ml  Output      0 ml  Net 1208.75 ml   Filed Weights   06/14/13 0524 06/15/13 0526 06/16/13 0515  Weight: 80.5 kg (177 lb 7.5 oz) 82.5 kg (181 lb 14.1 oz) 82.3 kg (181 lb 7 oz)    Exam:   General:  Alert afebrile nauseated.   Cardiovascular: s1s2  Respiratory: scattered rhonchi.   Abdomen: soft, distended, no tenderness bs+  Musculoskeletal: pedal edema present.   Data Reviewed: Basic Metabolic Panel:  Recent Labs Lab 06/12/13 1056  06/13/13 0500 06/14/13 0840 06/15/13 0530 06/15/13 1802 06/16/13 0926 06/16/13 1709  NA 116*  < > 119* 115* 114* 116* 114* 116*  K 5.2*  < > 5.2* 4.3 4.2  --  4.6 4.5  CL 82*  < > 85* 81* 80*  --  82* 83*  CO2 21  < > 22 23 21   --  19 19  GLUCOSE 253*  < > 268* 292* 229*  --  218* 171*  BUN 52*  < >  54* 53* 57*  --  61* 61*  CREATININE 1.43*  < > 1.43* 1.41* 1.54*  --  1.76* 1.78*  CALCIUM 9.1  < > 9.2 9.2 8.9  --  8.8 9.1  MG 3.0*  --   --   --   --   --   --   --   < > = values in this interval not displayed. Liver Function Tests:  Recent Labs Lab 06/12/13 0508 06/12/13 1056 06/13/13 0500  AST 72* 60* 64*  ALT 76* 69* 69*  ALKPHOS 285* 259* 262*  BILITOT 2.6* 2.6* 3.0*  PROT 6.8 6.5 6.2  ALBUMIN 3.6 3.3* 3.2*   No results found for this basename: LIPASE, AMYLASE,  in the last 168 hours  Recent Labs Lab 06/12/13 0508 06/13/13 0500  AMMONIA 86* 73*   CBC:  Recent Labs Lab 06/12/13 0508 06/13/13 0500 06/14/13 0840 06/15/13 0530  WBC 16.4* 14.7* 13.8* 14.6*  NEUTROABS  14.1*  --  10.5*  --   HGB 14.8 14.6 14.6 14.5  HCT 40.5 40.4 41.1 40.8  MCV 88.4 88.6 90.1 89.1  PLT 133* 129* 137* 142*   Cardiac Enzymes: No results found for this basename: CKTOTAL, CKMB, CKMBINDEX, TROPONINI,  in the last 168 hours BNP (last 3 results)  Recent Labs  04/06/13 0518 04/07/13 0500 06/12/13 1056  PROBNP 73.5 66.0 447.5*   CBG:  Recent Labs Lab 06/15/13 1708 06/15/13 2146 06/16/13 0745 06/16/13 1236 06/16/13 1633  GLUCAP 389* 228* 191* 187* 152*    Recent Results (from the past 240 hour(s))  CULTURE, BLOOD (ROUTINE X 2)     Status: None   Collection Time    06/12/13 10:56 AM      Result Value Range Status   Specimen Description BLOOD RIGHT HAND   Final   Special Requests     Final   Value: BOTTLES DRAWN AEROBIC AND ANAEROBIC 5CC BOTH BOTTLES   Culture  Setup Time     Final   Value: 06/12/2013 14:52     Performed at [facility]   Culture     Final   Value:        BLOOD CULTURE RECEIVED NO GROWTH TO DATE CULTURE WILL BE HELD FOR 5 DAYS BEFORE ISSUING A FINAL NEGATIVE REPORT     Performed at [facility]   Report Status PENDING   Incomplete  CULTURE, BLOOD (ROUTINE X 2)     Status: None   Collection Time    06/12/13 11:00 AM      Result Value Range Status   Specimen Description BLOOD LEFT ARM   Final   Special Requests BOTTLES DRAWN AEROBIC ONLY 3CC   Final   Culture  Setup Time     Final   Value: 06/12/2013 14:52     Performed at [facility]   Culture     Final   Value:        BLOOD CULTURE RECEIVED NO GROWTH TO DATE CULTURE WILL BE HELD FOR 5 DAYS BEFORE ISSUING A FINAL NEGATIVE REPORT     Performed at [facility]   Report Status PENDING   Incomplete  RESPIRATORY VIRUS PANEL     Status: None   Collection Time    06/14/13  5:36 AM      Result Value Range Status   Source - RVPAN NOSE   Final   Respiratory Syncytial Virus A NOT DETECTED   Final   Respiratory Syncytial Virus B  NOT DETECTED   Final    Influenza A NOT DETECTED   Final   Influenza B NOT DETECTED   Final   Parainfluenza 1 NOT DETECTED   Final   Parainfluenza 2 NOT DETECTED   Final   Parainfluenza 3 NOT DETECTED   Final   Metapneumovirus NOT DETECTED   Final   Rhinovirus NOT DETECTED   Final   Adenovirus NOT DETECTED   Final   Influenza A H1 NOT DETECTED   Final   Influenza A H3 NOT DETECTED   Final   Comment: (NOTE)           Normal Reference Range for each Analyte: NOT DETECTED     Testing performed using the Luminex xTAG Respiratory Viral Panel test     kit.     This test was developed and its performance characteristics determined     by [facility]. It has not been cleared or approved by the [place]     Food and Drug Administration. This test is used for clinical purposes.     It should not be regarded as investigational or for research. This     laboratory is certified under the Clinical Laboratory Improvement     Amendments of 1988 (CLIA) as qualified to perform high complexity     clinical laboratory testing.     Performed at [facility]     Studies: US Paracentesis  06/16/2013   CLINICAL DATA:  Cirrhosis, recurrent ascites, request for therapeutic paracentesis.  EXAM: ULTRASOUND GUIDED PARACENTESIS  TECHNIQUE: Survey ultrasound of the abdomen was performed and an appropriate skin entry site in the RLQ abdomen was selected. Skin site was marked, prepped with Betadine, and draped in usual sterile fashion, and infiltrated locally with 1% lidocaine. A 5 French multi-sidehole Yueh sheath needle was advanced into the peritoneal space until fluid could be aspirated. The sheath was advanced and the needle removed. 6.2 liters of cloudy yellow ascites were aspirated. No immediate complication.  IMPRESSION: Technically successful ultrasound guided paracentesis, removing 6.2 liters of ascites.  Read By:  Pattricia Boss PA-C   Electronically Signed   By: Richarda Overlie M.D.   On: 06/16/2013 15:11    Scheduled  Meds: . enoxaparin (LOVENOX) injection  40 mg Subcutaneous Q24H  . feeding supplement (GLUCERNA SHAKE)  237 mL Oral BID BM  . insulin aspart  0-15 Units Subcutaneous TID WC  . insulin glargine  15 Units Subcutaneous Daily  . lactose free nutrition  237 mL Oral Daily  . lactulose  10 g Oral TID  . [START ON 06/17/2013] levofloxacin  750 mg Oral Q48H  . linagliptin  5 mg Oral Daily  . pantoprazole  40 mg Oral BID AC   Continuous Infusions: . sodium chloride (hypertonic)      Principal Problem:   Acute respiratory failure Active Problems:   Ascites   Acute on chronic renal failure   Transaminitis   Hyponatremia   NAFLD (nonalcoholic fatty liver disease)   Diabetes mellitus, type II   Hepatic encephalopathy   Protein-calorie malnutrition, severe   Pneumonia   Hyperkalemia   Leukocytosis, unspecified    Time spent: 35 min    Elizibeth Breau  [facility] Pager [phone]. If 7PM-7AM, please contact night-coverage at [email or web address], password [facility] 06/16/2013, 6:38 PM  LOS: 4 days

## 2013-06-16 NOTE — Progress Notes (Signed)
Patient back from ultrasound post paracentesis, site RLQ clean dry and intact, no bleeding noted bandage in place. Patient denies any pain/distress. Will continue to assess patient.

## 2013-06-16 NOTE — Progress Notes (Signed)
Inpatient Diabetes Program Recommendations  AACE/ADA: New Consensus Statement on Inpatient Glycemic Control (2013)  Target Ranges:  Prepandial:   less than 140 mg/dL      Peak postprandial:   less than 180 mg/dL (1-2 hours)      Critically ill patients:  140 - 180 mg/dL     Results for GURTAJ, RUZ (MRN [id number]) as of 06/16/2013 10:26  Ref. Range 06/15/2013 11:43 06/15/2013 17:08 06/15/2013 21:46  Glucose-Capillary Latest Range: 70-99 mg/dL 409 (H) 811 (H) 914 (H)    Results for RAUNEL, DIMARTINO (MRN [id number]) as of 06/16/2013 10:26  Ref. Range 06/16/2013 07:45  Glucose-Capillary Latest Range: 70-99 mg/dL 086 (H)    **Noted Lantus increased to 15 units daily today.    **Patient still having issues with postprandial glucose levels.     **MD- Please consider adding Novolog meal coverage- Novolog 4 units tid with meals   Will follow. Ambrose Finland RN, MSN, CDE Diabetes Coordinator Inpatient Diabetes Program Team Pager: [phone] (8a-10p)

## 2013-06-16 NOTE — Procedures (Signed)
Successful US guided paracentesis from RLQ.  Yielded 6.2 liters of cloudy yellow fluid.  No immediate complications.  Pt tolerated well.   Specimen was not sent for labs.  Pattricia Boss D PA-C 06/16/2013 2:51 PM

## 2013-06-16 NOTE — Progress Notes (Signed)
This RN gave report to [employer] in Step-down.  Pt transferred to Rm. 1235.  Pt in NAD, family at bedside and following pt down to Step-down.

## 2013-06-16 NOTE — Consult Note (Signed)
Renal Service Consult Note [facility]  Jerry [name] 06/16/2013 Loreena Valeri D Requesting Physician:  Dr Blake Divine  Reason for Consult:  Hyponatremia HPI: The patient is a 72 y.o. year-old with hx of cirrhosis d.t NASH, HTN, DM2, HL and CKD stage 2/3 who presented with several day hx of AMS, SOB, fevers, chills and cough.  On day of admit 12/23 creat was 1.46, Na 119 and CXR suggestive of PNA.  He was admitted and treated with IV abx (vanc, cefipime). BP was low and he rec'd NS bolus IV.  He had paracentesis for recurrent ascites on 12/23 as well, 2.9L removed.  He rec'd IVF's and is 3.4L positive since admission. Wt is up from 79 > 82.3 kg.  BP's have been normal to low 100's.  Serum Na has not improved and is 114 today, renal svc asked to see for same. He has gotten IVF's for the last 48 hrs with NS.    Meds are lovenox, insulin, lactulose, levaquin, tradjenta, protonix, NS at 75/hr and prn zofran  Pt is lethargic, feels "weak" all over  ROS  no sob, no cough  no cp  no fevers  +diarrhea x 24 hrs  Past Medical History  Past Medical History  Diagnosis Date  . Diabetes mellitus without complication   . Hypertension   . Cirrhosis, non-alcoholic     October 2014 diagnosed   Past Surgical History  Past Surgical History  Procedure Laterality Date  . Back surgery    . Hernia repair     Family History  Family History  Problem Relation Age of Onset  . Diabetes Mellitus I Mother   . Congestive Heart Failure Mother    Social History  reports that he has quit smoking. He does not have any smokeless tobacco history on file. He reports that he does not drink alcohol or use illicit drugs. Allergies No Known Allergies Home medications Prior to Admission medications   Medication Sig Start Date End Date Taking? Authorizing Provider  esomeprazole (NEXIUM) 20 MG capsule Take 1 capsule (20 mg total) by mouth every morning. 05/17/13  Yes Catarina Hartshorn, MD  lactose free  nutrition (BOOST PLUS) LIQD Take 237 mLs by mouth daily. 06/03/13  Yes Penny Pia, MD  lactulose (CHRONULAC) 10 GM/15ML solution Take 10 g by mouth 3 (three) times daily.   Yes Historical Provider, MD  omeprazole-sodium bicarbonate (ZEGERID) 40-1100 MG per capsule Take 1 capsule by mouth every evening.  04/01/13  Yes Historical Provider, MD  sitaGLIPtin (JANUVIA) 50 MG tablet Take 1 tablet (50 mg total) by mouth daily. 05/17/13  Yes Catarina Hartshorn, MD   Liver Function Tests  Recent Labs Lab 06/12/13 0508 06/12/13 1056 06/13/13 0500  AST 72* 60* 64*  ALT 76* 69* 69*  ALKPHOS 285* 259* 262*  BILITOT 2.6* 2.6* 3.0*  PROT 6.8 6.5 6.2  ALBUMIN 3.6 3.3* 3.2*   No results found for this basename: LIPASE, AMYLASE,  in the last 168 hours CBC  Recent Labs Lab 06/12/13 0508 06/13/13 0500 06/14/13 0840 06/15/13 0530  WBC 16.4* 14.7* 13.8* 14.6*  NEUTROABS 14.1*  --  10.5*  --   HGB 14.8 14.6 14.6 14.5  HCT 40.5 40.4 41.1 40.8  MCV 88.4 88.6 90.1 89.1  PLT 133* 129* 137* 142*   Basic Metabolic Panel  Recent Labs Lab 06/12/13 0508 06/12/13 1056 06/12/13 1608 06/13/13 0500 06/14/13 0840 06/15/13 0530 06/15/13 1802 06/16/13 0926  NA 119* 116* 118* 119* 115* 114* 116* 114*  K  5.5* 5.2* 4.8 5.2* 4.3 4.2  --  4.6  CL 83* 82* 84* 85* 81* 80*  --  82*  CO2 22 21 20 22 23 21   --  19  GLUCOSE 274* 253* 331* 268* 292* 229*  --  218*  BUN 56* 52* 55* 54* 53* 57*  --  61*  CREATININE 1.53* 1.43* 1.44* 1.43* 1.41* 1.54*  --  1.76*  CALCIUM 9.4 9.1 9.3 9.2 9.2 8.9  --  8.8    Exam  Blood pressure 107/61, pulse 81, temperature 97.6 F (36.4 C), temperature source Oral, resp. rate 16, height 5\' 9"  (1.753 m), weight 82.3 kg (181 lb 7 oz), SpO2 97.00%.  gen: frail older adult male, weak, responds to voice  skin: no rash, cyanosis  heent: eomi, sclera anicteric, throat dry  neck: no jvd  chest: clear bilat, no rales or wheezing  cor: regular, no M or rub, pedal pulses  abd: soft, nt/nd,  mild ascites  ext: no LE or UE edema , no joint effusion, no gangrene/ulcers  neuro: somnolent, arousable  Assessment/Plan: 1. Hyponatremia- due to cirrhosis and/or vol depletion, less likely hepatorenal syndrome. On exam looks dry. Na however getting worse with NS.  Would recommend 3% saline trial overnight if aggressive Rx desired. Have d/w primary who will d/w family.  Hyponatremia in cirrhotics is associted with a poor prognosis.  2. CKD stage  3. Advanced cirrhosis d/t NASH 4. Hx HTN- BP 's low now c/w cirrhosis physiology 5. DM- on insulin   Vinson Moselle MD (pgr) [phone]    ([phone] 06/16/2013, 4:48 PM

## 2013-06-16 NOTE — Progress Notes (Signed)
CRITICAL VALUE ALERT  Critical value received: Sodium 114  Date of notification:  06/16/13  Time of notification:  1012  Critical value read back:yes  Nurse who received alert:  I Gema Ringold   MD notified (1st page):  Dr. Blake Divine   Time of first page:  1040  MD notified (2nd page):  Time of second page:  Responding MD:  Derinda Late   Time MD responded:  1130

## 2013-06-16 NOTE — Progress Notes (Addendum)
ANTIBIOTIC CONSULT NOTE - FOLLOW UP  Pharmacy Consult for renal adjustment of antibiotics (levofloxacin) Indication: pneumonia  No Known Allergies  Patient Measurements: Height: 5\' 9"  (175.3 cm) Weight: 181 lb 7 oz (82.3 kg) IBW/kg (Calculated) : 70.7 Adjusted Body Weight:   Vital Signs: Temp: 98.1 F (36.7 C) (12/29 0515) Temp src: Oral (12/29 0515) BP: 101/50 mmHg (12/29 0515) Pulse Rate: 88 (12/29 0515) Intake/Output from previous day: 12/28 0701 - 12/29 0700 In: 2358.8 [P.O.:1200; I.V.:1158.8] Out: -  Intake/Output from this shift:    Labs:  Recent Labs  06/14/13 0840 06/14/13 1214 06/15/13 0530  WBC 13.8*  --  14.6*  HGB 14.6  --  14.5  PLT 137*  --  142*  LABCREA  --  157.4  --   CREATININE 1.41*  --  1.54*   Estimated Creatinine Clearance: 42.1 ml/min (by C-G formula based on Cr of 1.54).  Recent Labs  06/15/13 0530  VANCOTROUGH 26.3*     Microbiology: Recent Results (from the past 720 hour(s))  CULTURE, BLOOD (ROUTINE X 2)     Status: None   Collection Time    06/12/13 10:56 AM      Result Value Range Status   Specimen Description BLOOD RIGHT HAND   Final   Special Requests     Final   Value: BOTTLES DRAWN AEROBIC AND ANAEROBIC 5CC BOTH BOTTLES   Culture  Setup Time     Final   Value: 06/12/2013 14:52     Performed at [facility]   Culture     Final   Value:        BLOOD CULTURE RECEIVED NO GROWTH TO DATE CULTURE WILL BE HELD FOR 5 DAYS BEFORE ISSUING A FINAL NEGATIVE REPORT     Performed at [facility]   Report Status PENDING   Incomplete  CULTURE, BLOOD (ROUTINE X 2)     Status: None   Collection Time    06/12/13 11:00 AM      Result Value Range Status   Specimen Description BLOOD LEFT ARM   Final   Special Requests BOTTLES DRAWN AEROBIC ONLY 3CC   Final   Culture  Setup Time     Final   Value: 06/12/2013 14:52     Performed at [facility]   Culture     Final   Value:        BLOOD CULTURE RECEIVED NO GROWTH  TO DATE CULTURE WILL BE HELD FOR 5 DAYS BEFORE ISSUING A FINAL NEGATIVE REPORT     Performed at [facility]   Report Status PENDING   Incomplete  RESPIRATORY VIRUS PANEL     Status: None   Collection Time    06/14/13  5:36 AM      Result Value Range Status   Source - RVPAN NOSE   Final   Respiratory Syncytial Virus A NOT DETECTED   Final   Respiratory Syncytial Virus B NOT DETECTED   Final   Influenza A NOT DETECTED   Final   Influenza B NOT DETECTED   Final   Parainfluenza 1 NOT DETECTED   Final   Parainfluenza 2 NOT DETECTED   Final   Parainfluenza 3 NOT DETECTED   Final   Metapneumovirus NOT DETECTED   Final   Rhinovirus NOT DETECTED   Final   Adenovirus NOT DETECTED   Final   Influenza A H1 NOT DETECTED   Final   Influenza A H3 NOT DETECTED  Final   Comment: (NOTE)           Normal Reference Range for each Analyte: NOT DETECTED     Testing performed using the Luminex xTAG Respiratory Viral Panel test     kit.     This test was developed and its performance characteristics determined     by [facility]. It has not been cleared or approved by the [place]     Food and Drug Administration. This test is used for clinical purposes.     It should not be regarded as investigational or for research. This     laboratory is certified under the Clinical Laboratory Improvement     Amendments of 1988 (CLIA) as qualified to perform high complexity     clinical laboratory testing.     Performed at [facility]    Anti-infectives   Start     Dose/Rate Route Frequency Ordered Stop   06/15/13 1800  vancomycin (VANCOCIN) IVPB 1000 mg/200 mL premix  Status:  Discontinued     1,000 mg 200 mL/hr over 60 Minutes Intravenous Every 24 hours 06/15/13 0643 06/15/13 1733   06/15/13 1800  levofloxacin (LEVAQUIN) tablet 500 mg     500 mg Oral Daily 06/15/13 1733     06/12/13 1800  vancomycin (VANCOCIN) IVPB 750 mg/150 ml premix  Status:  Discontinued     750 mg 150 mL/hr over 60  Minutes Intravenous Every 12 hours 06/12/13 1624 06/15/13 0628   06/12/13 1700  ceFEPIme (MAXIPIME) 2 g in dextrose 5 % 50 mL IVPB  Status:  Discontinued     2 g 100 mL/hr over 30 Minutes Intravenous Every 24 hours 06/12/13 1624 06/15/13 1733   06/12/13 1600  cefTRIAXone (ROCEPHIN) 1 g in dextrose 5 % 50 mL IVPB  Status:  Discontinued     1 g 100 mL/hr over 30 Minutes Intravenous Every 24 hours 06/12/13 1011 06/12/13 1529   06/12/13 1200  azithromycin (ZITHROMAX) tablet 500 mg  Status:  Discontinued     500 mg Oral Daily 06/12/13 1011 06/12/13 1529   06/12/13 0545  vancomycin (VANCOCIN) IVPB 1000 mg/200 mL premix     1,000 mg 200 mL/hr over 60 Minutes Intravenous  Once 06/12/13 0535 06/12/13 0724   06/12/13 0545  piperacillin-tazobactam (ZOSYN) IVPB 3.375 g     3.375 g 100 mL/hr over 30 Minutes Intravenous  Once 06/12/13 0535 06/12/13 0802      Assessment: 72 YOM presents with altered mental status, he was recently admitted 06/02/13 for confusion. He has h/o NASH requiring large-volume paracentesis frequently. Orders to start vancomycin and cefepime for pneumonia. Given vancomycin and zosyn x 1 12/25am. Antibiotics narrowed to PO levofloxacin 12/28  12/25>>vancomycin >> 12/28 12/25 >>cefepime >> 12/28 12/28 >> levofloxacin >>   Tmax: afebrile WBCs: no new labs, prev = 14.6 CKD: no new labs, prev = Scr rising 1.41 > 1.54, CrCl 42 CG  12/25 blood: NGTD / sputum: ordered 12/25: respiratory virus panel: not detected 12/25: legionella urine Ag: negative 12/25: S. Pneumoniae urine Ag: negative  Goal of Therapy:  Appropriately dose antibiotics for renal function and indication  Plan:  Day #2 levofloxacin, D#5 total antibiotics  Based on est CrCl and indication, change levofloxacin to 750mg  PO q48h, next dose due tomorrow am (500mg  dose given 18:00 12/28)  Juliette Alcide, PharmD, BCPS.   Pager: [phone]  06/16/2013,8:07 AM

## 2013-06-17 ENCOUNTER — Encounter (HOSPITAL_COMMUNITY): Admission: RE | Admit: 2013-06-17 | Payer: Medicare Other | Source: Ambulatory Visit

## 2013-06-17 ENCOUNTER — Encounter (HOSPITAL_COMMUNITY): Payer: Self-pay

## 2013-06-17 ENCOUNTER — Ambulatory Visit (HOSPITAL_COMMUNITY): Payer: Medicare Other

## 2013-06-17 ENCOUNTER — Other Ambulatory Visit (HOSPITAL_COMMUNITY): Payer: Medicare Other

## 2013-06-17 DIAGNOSIS — Z515 Encounter for palliative care: Secondary | ICD-10-CM

## 2013-06-17 LAB — SODIUM
Sodium: 121 mEq/L — CL (ref 137–147)
Sodium: 120 mEq/L — CL (ref 137–147)
Sodium: 119 mEq/L — CL (ref 137–147)

## 2013-06-17 LAB — GLUCOSE, CAPILLARY
Glucose-Capillary: 156 mg/dL — ABNORMAL HIGH (ref 70–99)
Glucose-Capillary: 125 mg/dL — ABNORMAL HIGH (ref 70–99)
Glucose-Capillary: 149 mg/dL — ABNORMAL HIGH (ref 70–99)

## 2013-06-17 LAB — CBC
Hemoglobin: 13.6 g/dL (ref 13.0–17.0)
MCH: 32.5 pg (ref 26.0–34.0)
MCHC: 36.6 g/dL — ABNORMAL HIGH (ref 30.0–36.0)
Platelets: 111 10*3/uL — ABNORMAL LOW (ref 150–400)
RBC: 4.19 MIL/uL — ABNORMAL LOW (ref 4.22–5.81)
WBC: 10.1 10*3/uL (ref 4.0–10.5)

## 2013-06-17 LAB — BASIC METABOLIC PANEL
Calcium: 8.9 mg/dL (ref 8.4–10.5)
GFR calc Af Amer: 42 mL/min — ABNORMAL LOW (ref >90)
GFR calc non Af Amer: 37 mL/min — ABNORMAL LOW (ref >90)
Potassium: 4.6 mEq/L (ref 3.7–5.3)
Sodium: 122 mEq/L — ABNORMAL LOW (ref 137–147)

## 2013-06-17 LAB — EXPECTORATED SPUTUM ASSESSMENT W GRAM STAIN, RFLX TO RESP C

## 2013-06-17 LAB — CLOSTRIDIUM DIFFICILE BY PCR: Toxigenic C. Difficile by PCR: NEGATIVE

## 2013-06-17 MED ORDER — CHLORHEXIDINE GLUCONATE CLOTH 2 % EX PADS
6.0000 | MEDICATED_PAD | Freq: Every day | CUTANEOUS | Status: AC
  Administered 2013-06-17 – 2013-06-21 (×4): 6 via TOPICAL

## 2013-06-17 MED ORDER — MUPIROCIN 2 % EX OINT
1.0000 "application " | TOPICAL_OINTMENT | Freq: Two times a day (BID) | CUTANEOUS | Status: AC
  Administered 2013-06-17 – 2013-06-21 (×8): 1 via NASAL
  Filled 2013-06-17 (×2): qty 22

## 2013-06-17 MED ORDER — SODIUM CHLORIDE 3 % IV SOLN
INTRAVENOUS | Status: DC
  Administered 2013-06-18: 05:00:00 via INTRAVENOUS
  Administered 2013-06-18: 55 mL/h via INTRAVENOUS
  Filled 2013-06-17 (×5): qty 500

## 2013-06-17 NOTE — Consult Note (Signed)
Patient Jerry [name]      DOB: April 21, 1941      WGN:[id number]     Consult Note from the Palliative Medicine Team at [facility]    Consult Requested by: Dr.  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  Patient admitted with altered mental status secondary to respiratory failure related to pneumonia, and hepatic encephalopathy.  I met with the patient's wife who is having difficulty accepting his declining prognosis.  Patient accepted hospice care but his wife still feels that aggressive treatments are warranted, struggling with imminent loss. See my note from the day of service.  Wife requesting full code status and continued paracentesis.  Will continue to work with family as they come to grips with current medical disease   Goals of Care: 1.  Code Status: Full    2. Scope of Treatment: Continue curative treatment- abx, paracentesis, oxygen support    4. Disposition: to be determined, possible back home with hospice service vs hospice home if continues to do poorly   3. Symptom Management:  1.  Ativan prn for anxiety  2.  Pneumonia: complete treatment  3. Hepatic Encepalopathy: continue chronulac  4. Psychosocial: Married,   5. Spiritual: Family would appreciate spiritual support from hospice team and offered chaplains.        Patient Documents Completed or Given: Document Given Completed  Advanced Directives Pkt    MOST    DNR    Gone from [facility]    Hard Choices  ask      Brief HPI: 72 year old white male with known cirrhosis secondary to Jerry [name]. Patient returns to hospital for recurrent paracentesis which is the calm when necessary. The family has not continued with the patient's medical course. We are asked to assist with goals of care.   ROS: He denies nausea vomiting. Does have abdominal fullness    PMH:  Past Medical  History  Diagnosis Date  . Diabetes mellitus without complication   . Hypertension   . Cirrhosis, non-alcoholic     October 2014 diagnosed  . Hospice care patient     active with [facility] -call [phone] with any new encounters     PSH: Past Surgical History  Procedure Laterality Date  . Back surgery    . Hernia repair     I have reviewed the FH and SH and  If appropriate update it with new information. No Known Allergies Scheduled Meds: . Chlorhexidine Gluconate Cloth  6 each Topical Q0600  . enoxaparin (LOVENOX) injection  40 mg Subcutaneous Q24H  . feeding supplement (GLUCERNA SHAKE)  237 mL Oral BID BM  . insulin aspart  0-15 Units Subcutaneous TID WC  . insulin glargine  15 Units Subcutaneous Daily  . lactose free nutrition  237 mL Oral Daily  . lactulose  10 g Oral BID  . levofloxacin  750 mg Oral Q48H  . linagliptin  5 mg Oral Daily  . mupirocin ointment  1 application Nasal BID  . pantoprazole  40 mg Oral BID AC  . sodium chloride  10-40 mL Intracatheter Q12H   Continuous Infusions: . sodium chloride (hypertonic) 35 mL/hr at 06/17/13 1649   PRN Meds:.albuterol, ondansetron (ZOFRAN) IV, sodium chloride    BP 106/60  Pulse 88  Temp(Src) 98 F (36.7 C) (Oral)  Resp 13  Ht 5\' 9"  (1.753 m)  Wt  81 kg (178 lb 9.2 oz)  BMI 26.36 kg/m2  SpO2 96%   PPS: 20-30%  Intake/Output Summary (Last 24 hours) at 06/17/13 1656 Last data filed at 06/17/13 1300  Gross per 24 hour  Intake 541.75 ml  Output    375 ml  Net 166.75 ml    Physical Exam:  General: mild distress, laying on his side, short-term memory deficits notable slow with HEENT:  Pupils equal round and reactive to light, anicteric his membranes dry Chest:   Decreased but clear to auscultation and don't appreciate any rhonchi rest or wheezes CVS: Regular rate and rhythm positive S1 and S2 Abdomen: Distended positive ascites nontender Ext: Lying in fetal position trace to 1+ edema Neuro: Slightly  impaired  Labs: CBC    Component Value Date/Time   WBC 10.1 06/17/2013 0510   RBC 4.19* 06/17/2013 0510   HGB 13.6 06/17/2013 0510   HCT 37.2* 06/17/2013 0510   PLT 111* 06/17/2013 0510   MCV 88.8 06/17/2013 0510   MCH 32.5 06/17/2013 0510   MCHC 36.6* 06/17/2013 0510   RDW 15.7* 06/17/2013 0510   LYMPHSABS 2.1 06/14/2013 0840   MONOABS 1.1* 06/14/2013 0840   EOSABS 0.1 06/14/2013 0840   BASOSABS 0.0 06/14/2013 0840      CMP     Component Value Date/Time   NA 119* 06/17/2013 1400   K 4.6 06/17/2013 0510   CL 88* 06/17/2013 0510   CO2 19 06/17/2013 0510   GLUCOSE 142* 06/17/2013 0510   BUN 64* 06/17/2013 0510   CREATININE 1.75* 06/17/2013 0510   CALCIUM 8.9 06/17/2013 0510   PROT 6.2 06/13/2013 0500   ALBUMIN 3.2* 06/13/2013 0500   AST 64* 06/13/2013 0500   ALT 69* 06/13/2013 0500   ALKPHOS 262* 06/13/2013 0500   BILITOT 3.0* 06/13/2013 0500   GFRNONAA 37* 06/17/2013 0510   GFRAA 42* 06/17/2013 0510    Chest Xray Reviewed/Impressions: Mild lung base opacity as described most consistent with  atelectasis.      Time In Time Out Total Time Spent with Patient Total Overall Time  315 pm 515 pm 20 min 120 min    Greater than 50%  of this time was spent counseling and coordinating care related to the above assessment and plan.  Kaileia Flow L. Ladona Ridgel, MD MBA The Palliative Medicine Team at [facility] Phone: [phone] Pager: [phone]

## 2013-06-17 NOTE — Consult Note (Signed)
Patient [name]      DOB: 1940/12/26      YNW:[id number]  Summary of goals of care; full note to follow:  Met with Spouse Babs Bertin, Daughter Annabelle and a sister Lupita Leash   Patient can not fully participate because of his anxiety over the topic and encephalopathy  Family dazed over continued decline of the patient. Didn't have full grasp of the realities of where the patient was in his life's journey.  Spouse having the most trouble .  She would like to advocate for treating the treatable at this time and she states that her decision holds to remain a full code.  We talked about the cyclic nature of his disease and how one piece is interwoven into the other and that their will come a time that we will not be able to fix things.  We also talked about the fact that there are parts of treating [name] that we can choose to give up voluntarily when they are longer giving quality of life and dignity (paracentsis etc).   We talked about what that would look like and where we might want to be for his time of death (home vs [facility]).  [name] is starting to get a better grasp on this.  I will ask our team to check back tomorrow to see where they are in their processing of all that we talked about.  A Most for can be completed at that time.    Would give a [employer] at the next visit spouse too overwhelmed for this right now.   Patient let me update him about his illness, and told me all that he wants to do is go home and get a shower and shave.j  I don't think that he has full capacity for decision making at this time but that may change as he continues to improve.  Dr. Blake Divine updated, Dr. Arta Silence updated,   Total time: 315 pm-515 pm  Jerry Reaver L. Ladona Ridgel, Jerry [name] The Palliative Medicine Team at [facility] Phone: [phone] Pager: [phone]

## 2013-06-17 NOTE — Progress Notes (Signed)
Room WL 1235 Jerry [name] of [facility] RN Visit-Jerry Koerber RN  Related admission to [facility] diagnosis of chronic liver disease.  Pt is full code at this time in EPIC.  In home, pt has OOF DNR, living will and HCPOA-his wife.  Discussed with wife and she states pt does not want feeding tube and was afraid being a DNR that he would not be treated for PNA.  Assured wife and pt the treatable is treated.  Pt alert & oriented, sitting up in bed, without complaints of pain or discomfort.   Wife present at bedside.  Patient's home medication list is on shadow chart.   Per wife, their plans are to return home with [facility] home [name] services at discharge.  She states his PNA is improving and sodium level improving.  No further DME needs at home at this time.  Advised wife [facility] will continue following throughout the hospital stay.    Please call [facility] @ [phone]- ask for RN Liaison or after hours,ask for on-call RN with any hospice needs.   Thank you.  Joneen Boers, RN  [facility]  Hospice Liaison  [phone])

## 2013-06-17 NOTE — Progress Notes (Signed)
[facility] PROGRESS NOTE  Jerry [name] RUE:[id number] DOB: 1940-06-20 DOA: 06/12/2013 PCP: Thora Lance, MD BRIEF HPI/ Interim History: Pt is 72 yo male with complicated and complex medical history including liver cirrhosis secondary to NASH and requiring frequent paracentesis due to rapidly re accumulating ascites, last one done 06/10/2013 and 2.9 L removed, HTN, HLD, DM with last HgA1C 7.1 (06/02/2013), CKD stage II -III, with baseline Cr 1.2 - 1.6, Anemia of chronic disease with baseline hg ~11. He is now presenting to [facility] ED with main concern of several days duration of progressively worsening altered mental status associated with exertional dyspnea as well as dyspnea at rest, subjective fevers, chills, cough productive of clear sputum, CXR worrisome for PNA. [facility] asked to admit for further evaluation. He was also found to be hyponatremic. But asymptomatic. He was started on broad spectrum antibiotics with IV vancomycin and IV cefepime from 12/25 till 12/28, later on transitioned to oral levaquin. He  During his hospitalization his hyponatremia did not improve with gentle hydration. Renal consulted and recommended 3 % saline if pt wanted aggressive approach. We ordered a PICC line, and transferred the patient to step done and started him on hypertonic saline. His sodium has improved to 120. Palliative care consulted for goals of care. He is currently full code, and he isb eing followed up with [facility].    Assessment/Plan:  Acute respiratory failure  - based on symptoms and CXR findings mostly consistent with PNA - he was admitted to telemetry and started on broad spectrum antibiotics. Repeat cxr shows improvement and his antibiotics were transitioned to oral levaquin. Please discontinue oral levaquin after tomorrow's dose.   - urine legionella and strep pneumo negative.  - provide oxygen as needed, BD's PRN  Active Problems:  Ascites  - status post paracentesis 06/16/2013   - 6.2 L removed and pt has tolerated well and he was given albumin 25 mg . Acute on chronic renal failure  - likely secondary to pre renal etiology, not improving to fluids.  - monitor I's and O's, daily weights  - requested renal consult.  Hyponatremia  - improving.  - likely pre renal as well imposed on chronic hyponatremia  - fluids started, without any improvement.  - he appears dehydrated clinically, .  - he was started on 3 % saline and transferred to step down.  - renal consulted.  Pneumonia, HCAP  - transitioned to oral levaquin and can be discontinued in am.  Leukocytosis, unspecified  - secondary to HCAP as noted above , resolved.   Transaminitis  - secondary to NAFLD (nonalcoholic fatty liver disease)  Diabetes mellitus, type II  - recent A1C 7.1  - continue Januvia  - place on SSI as well  Hepatic encephalopathy  - ammonia 86 on admission  - continue Lactulose, .  Hyperkalemia  - mild, repeat BMP  In am. One dose of kayexalate given.  - resolved.  Protein-calorie malnutrition, severe  - nutrition consultation   Loose BM: - possibly antibiotic induced. c diff negative.   DVT prophylaxis.   Code Status: Full  Family Communication: family at bedside, discussed thep lan of care with the patient and family. .  Disposition Plan: home with hospice when stable.     Consultants: Renal  Palliative care consult.   Procedures: Paracentesis. 12/29   Antibiotics: Vancomycin 12/25- 12/28 Cefepime 12/25- 12/28 levaquin 12/28. >>  HPI/Subjective: Feels better than yesterday.   Objective: Filed Vitals:   06/17/13 0800  BP: 100/48  Pulse: 93  Temp:   Resp: 17    Intake/Output Summary (Last 24 hours) at 06/17/13 1053 Last data filed at 06/17/13 0600  Gross per 24 hour  Intake 321.75 ml  Output      0 ml  Net 321.75 ml   Filed Weights   06/16/13 0515 06/16/13 2040 06/17/13 0500  Weight: 82.3 kg (181 lb 7 oz) 79.8 kg (175 lb 14.8 oz) 81 kg (178 lb  9.2 oz)    Exam:   General:  Alert afebrile comfortable.   Cardiovascular: s1s2  Respiratory: scattered rhonchi at bases.  Abdomen: soft, distended, no tenderness bs+  Musculoskeletal: pedal edema present.   Data Reviewed: Basic Metabolic Panel:  Recent Labs Lab 06/12/13 1056  06/14/13 0840 06/15/13 0530  06/16/13 0926 06/16/13 1709 06/16/13 2224 06/17/13 0245 06/17/13 0510  NA 116*  < > 115* 114*  < > 114* 116* 116* 121* 122*  K 5.2*  < > 4.3 4.2  --  4.6 4.5  --   --  4.6  CL 82*  < > 81* 80*  --  82* 83*  --   --  88*  CO2 21  < > 23 21  --  19 19  --   --  19  GLUCOSE 253*  < > 292* 229*  --  218* 171*  --   --  142*  BUN 52*  < > 53* 57*  --  61* 61*  --   --  64*  CREATININE 1.43*  < > 1.41* 1.54*  --  1.76* 1.78*  --   --  1.75*  CALCIUM 9.1  < > 9.2 8.9  --  8.8 9.1  --   --  8.9  MG 3.0*  --   --   --   --   --   --   --   --   --   < > = values in this interval not displayed. Liver Function Tests:  Recent Labs Lab 06/12/13 0508 06/12/13 1056 06/13/13 0500  AST 72* 60* 64*  ALT 76* 69* 69*  ALKPHOS 285* 259* 262*  BILITOT 2.6* 2.6* 3.0*  PROT 6.8 6.5 6.2  ALBUMIN 3.6 3.3* 3.2*   No results found for this basename: LIPASE, AMYLASE,  in the last 168 hours  Recent Labs Lab 06/12/13 0508 06/13/13 0500  AMMONIA 86* 73*   CBC:  Recent Labs Lab 06/12/13 0508 06/13/13 0500 06/14/13 0840 06/15/13 0530 06/17/13 0510  WBC 16.4* 14.7* 13.8* 14.6* 10.1  NEUTROABS 14.1*  --  10.5*  --   --   HGB 14.8 14.6 14.6 14.5 13.6  HCT 40.5 40.4 41.1 40.8 37.2*  MCV 88.4 88.6 90.1 89.1 88.8  PLT 133* 129* 137* 142* 111*   Cardiac Enzymes: No results found for this basename: CKTOTAL, CKMB, CKMBINDEX, TROPONINI,  in the last 168 hours BNP (last 3 results)  Recent Labs  04/06/13 0518 04/07/13 0500 06/12/13 1056  PROBNP 73.5 66.0 447.5*   CBG:  Recent Labs Lab 06/16/13 0745 06/16/13 1236 06/16/13 1633 06/16/13 2237 06/17/13 0843  GLUCAP  191* 187* 152* 156* 125*    Recent Results (from the past 240 hour(s))  CULTURE, BLOOD (ROUTINE X 2)     Status: None   Collection Time    06/12/13 10:56 AM      Result Value Range Status   Specimen Description BLOOD RIGHT HAND   Final   Special Requests     Final  Value: BOTTLES DRAWN AEROBIC AND ANAEROBIC 5CC BOTH BOTTLES   Culture  Setup Time     Final   Value: 06/12/2013 14:52     Performed at [facility]   Culture     Final   Value:        BLOOD CULTURE RECEIVED NO GROWTH TO DATE CULTURE WILL BE HELD FOR 5 DAYS BEFORE ISSUING A FINAL NEGATIVE REPORT     Performed at [facility]   Report Status PENDING   Incomplete  CULTURE, BLOOD (ROUTINE X 2)     Status: None   Collection Time    06/12/13 11:00 AM      Result Value Range Status   Specimen Description BLOOD LEFT ARM   Final   Special Requests BOTTLES DRAWN AEROBIC ONLY 3CC   Final   Culture  Setup Time     Final   Value: 06/12/2013 14:52     Performed at [facility]   Culture     Final   Value:        BLOOD CULTURE RECEIVED NO GROWTH TO DATE CULTURE WILL BE HELD FOR 5 DAYS BEFORE ISSUING A FINAL NEGATIVE REPORT     Performed at [facility]   Report Status PENDING   Incomplete  RESPIRATORY VIRUS PANEL     Status: None   Collection Time    06/14/13  5:36 AM      Result Value Range Status   Source - RVPAN NOSE   Final   Respiratory Syncytial Virus A NOT DETECTED   Final   Respiratory Syncytial Virus B NOT DETECTED   Final   Influenza A NOT DETECTED   Final   Influenza B NOT DETECTED   Final   Parainfluenza 1 NOT DETECTED   Final   Parainfluenza 2 NOT DETECTED   Final   Parainfluenza 3 NOT DETECTED   Final   Metapneumovirus NOT DETECTED   Final   Rhinovirus NOT DETECTED   Final   Adenovirus NOT DETECTED   Final   Influenza A H1 NOT DETECTED   Final   Influenza A H3 NOT DETECTED   Final   Comment: (NOTE)           Normal Reference Range for each Analyte: NOT DETECTED     Testing  performed using the Lumine[employer] xTAG Respiratory Viral Panel test     kit.     This test was developed and its performance characteristics determined     by [facility]. It has not been cleared or approved by the [place]     Food and Drug Administration. This test is used for clinical purposes.     It should not be regarded as investigational or for research. This     laboratory is certified under the Clinical Laboratory Improvement     Amendments of 1988 (CLIA) as qualified to perform high complexity     clinical laboratory testing.     Performed at [facility]  MRSA PCR SCREENING     Status: Abnormal   Collection Time    06/16/13  8:37 PM      Result Value Range Status   MRSA by PCR POSITIVE (*) NEGATIVE Final   Comment:            The GeneXpert MRSA Assay (FDA     approved for NASAL specimens     only), is one component of a     comprehensive MRSA colonization  surveillance program. It is not     intended to diagnose MRSA     infection nor to guide or     monitor treatment for     MRSA infections.     RESULT CALLED TO, READ BACK BY AND VERIFIED WITH:     ALDRIDGE,D/2W @2219  ON 06/16/13 BY KARCZEWSKI,S.  CLOSTRIDIUM DIFFICILE BY PCR     Status: None   Collection Time    06/17/13  5:52 AM      Result Value Range Status   C difficile by pcr NEGATIVE  NEGATIVE Final   Comment: Performed at [facility]     Studies: [place] Paracentesis  06/16/2013   CLINICAL DATA:  Cirrhosis, recurrent ascites, request for therapeutic paracentesis.  EXAM: ULTRASOUND GUIDED PARACENTESIS  TECHNIQUE: Survey ultrasound of the abdomen was performed and an appropriate skin entry site in the RLQ abdomen was selected. Skin site was marked, prepped with Betadine, and draped in usual sterile fashion, and infiltrated locally with 1% lidocaine. A 5 French multi-sidehole Yueh sheath needle was advanced into the peritoneal space until fluid could be aspirated. The sheath was advanced and the  needle removed. 6.2 liters of cloudy yellow ascites were aspirated. No immediate complication.  IMPRESSION: Technically successful ultrasound guided paracentesis, removing 6.2 liters of ascites.  Read By:  Pattricia Boss PA-C   Electronically Signed   By: Richarda Overlie M.D.   On: 06/16/2013 15:11    Scheduled Meds: . Chlorhexidine Gluconate Cloth  6 each Topical Q0600  . enoxaparin (LOVENOX) injection  40 mg Subcutaneous Q24H  . feeding supplement (GLUCERNA SHAKE)  237 mL Oral BID BM  . insulin aspart  0-15 Units Subcutaneous TID WC  . insulin glargine  15 Units Subcutaneous Daily  . lactose free nutrition  237 mL Oral Daily  . lactulose  10 g Oral BID  . levofloxacin  750 mg Oral Q48H  . linagliptin  5 mg Oral Daily  . mupirocin ointment  1 application Nasal BID  . pantoprazole  40 mg Oral BID AC  . sodium chloride  10-40 mL Intracatheter Q12H   Continuous Infusions: . sodium chloride (hypertonic) 35 mL/hr (06/17/13 0654)    Principal Problem:   Acute respiratory failure Active Problems:   Ascites   Acute on chronic renal failure   Transaminitis   Hyponatremia   NAFLD (nonalcoholic fatty liver disease)   Diabetes mellitus, type II   Hepatic encephalopathy   Protein-calorie malnutrition, severe   Pneumonia   Hyperkalemia   Leukocytosis, unspecified    Time spent: 35 min    Cliford Sequeira  [facility] Pager [phone]. If 7PM-7AM, please contact night-coverage at [email or web address], password [facility] 06/17/2013, 10:53 AM  LOS: 5 days

## 2013-06-17 NOTE — Progress Notes (Signed)
[facility] Progress Note    Subjective: wt stable, patient still feels "lousy" today, just tired, no pain or sob. Serum Na up to 120 today, on 3% saline reduced to 35cc/hr from 45 cc/hr early this am around 7am   Exam  Blood pressure 106/60, pulse 88, temperature 98 F (36.7 C), temperature source Oral, resp. rate 13, height 5\' 9"  (1.753 m), weight 81 kg (178 lb 9.2 oz), SpO2 96.00%. Frail, older adult male lying on his side, no distress, looks uncomfortable No jvd, flat neck veins Clear bilat, no rales  RRR no M or rub Abd soft, nt/nd, mild ascites No leg or UE edema Neuro exam nonfocal, gen weakness  Urine Na < 20  Assessment/Plan:  1. Hyponatremia- due to cirrhosis and/or intravascular vol depletion.  Getting 3% saline, Na up 6 points over about 18 hours. Continue q 4hr lab checks and 3% NS at 35/hr for now.     2. CKD stage  3. Advanced cirrhosis d/t NASH: palliative care meeting in progress 4. Hx HTN- BP 's low 5. DM- on insulin   Vinson Moselle MD  pager [phone]    cell [phone]  06/17/2013, 4:00 PM   Recent Labs Lab 06/16/13 0926 06/16/13 1709  06/17/13 0245 06/17/13 0510 06/17/13 1036  NA 114* 116*  < > 121* 122* 120*  K 4.6 4.5  --   --  4.6  --   CL 82* 83*  --   --  88*  --   CO2 19 19  --   --  19  --   GLUCOSE 218* 171*  --   --  142*  --   BUN 61* 61*  --   --  64*  --   CREATININE 1.76* 1.78*  --   --  1.75*  --   CALCIUM 8.8 9.1  --   --  8.9  --   < > = values in this interval not displayed.  Recent Labs Lab 06/12/13 0508 06/12/13 1056 06/13/13 0500  AST 72* 60* 64*  ALT 76* 69* 69*  ALKPHOS 285* 259* 262*  BILITOT 2.6* 2.6* 3.0*  PROT 6.8 6.5 6.2  ALBUMIN 3.6 3.3* 3.2*    Recent Labs Lab 06/12/13 0508  06/14/13 0840 06/15/13 0530 06/17/13 0510  WBC 16.4*  < > 13.8* 14.6* 10.1  NEUTROABS 14.1*  --  10.5*  --   --   HGB 14.8  < > 14.6 14.5 13.6  HCT 40.5  < > 41.1 40.8 37.2*  MCV 88.4  < > 90.1 89.1 88.8  PLT  133*  < > 137* 142* 111*  < > = values in this interval not displayed. . Chlorhexidine Gluconate Cloth  6 each Topical Q0600  . enoxaparin (LOVENOX) injection  40 mg Subcutaneous Q24H  . feeding supplement (GLUCERNA SHAKE)  237 mL Oral BID BM  . insulin aspart  0-15 Units Subcutaneous TID WC  . insulin glargine  15 Units Subcutaneous Daily  . lactose free nutrition  237 mL Oral Daily  . lactulose  10 g Oral BID  . levofloxacin  750 mg Oral Q48H  . linagliptin  5 mg Oral Daily  . mupirocin ointment  1 application Nasal BID  . pantoprazole  40 mg Oral BID AC  . sodium chloride  10-40 mL Intracatheter Q12H   . sodium chloride (hypertonic) 35 mL/hr (06/17/13 0654)   albuterol, ondansetron (ZOFRAN) IV, sodium chloride

## 2013-06-17 NOTE — Progress Notes (Signed)
Patient ZO:[id number] Choyce      DOB: [birth year]      UJW:[id number]  Tentative meeting for GOC scheduled for 3 pm pending approval from patient's spouse who was indisposed at the time of the call.  Nursing to confirm and call if time needs to be changed . Will update and affirm request with [facility] this am.   Parys Elenbaas L. Ladona Ridgel, MD MBA The Palliative Medicine Team at [facility] Phone: [phone] Pager: [phone]

## 2013-06-18 DIAGNOSIS — K7290 Hepatic failure, unspecified without coma: Secondary | ICD-10-CM

## 2013-06-18 LAB — GLUCOSE, CAPILLARY
Glucose-Capillary: 210 mg/dL — ABNORMAL HIGH (ref 70–99)
Glucose-Capillary: 321 mg/dL — ABNORMAL HIGH (ref 70–99)

## 2013-06-18 LAB — CULTURE, BLOOD (ROUTINE X 2): Culture: NO GROWTH

## 2013-06-18 LAB — SODIUM
Sodium: 121 mEq/L — CL (ref 137–147)
Sodium: 122 mEq/L — ABNORMAL LOW (ref 137–147)
Sodium: 123 mEq/L — ABNORMAL LOW (ref 137–147)
Sodium: 123 mEq/L — ABNORMAL LOW (ref 137–147)
Sodium: 147 mEq/L (ref 137–147)

## 2013-06-18 MED ORDER — SODIUM CHLORIDE 1 G PO TABS: 2.0000 g | ORAL_TABLET | Freq: Three times a day (TID) | ORAL | Status: DC

## 2013-06-18 MED ORDER — SODIUM CHLORIDE 1 G PO TABS
2.0000 g | ORAL_TABLET | Freq: Two times a day (BID) | ORAL | Status: DC
  Administered 2013-06-18 – 2013-06-25 (×10): 2 g via ORAL
  Filled 2013-06-18 (×17): qty 2

## 2013-06-18 MED ORDER — RIFAXIMIN 550 MG PO TABS
550.0000 mg | ORAL_TABLET | Freq: Two times a day (BID) | ORAL | Status: DC
  Administered 2013-06-18 – 2013-06-25 (×12): 550 mg via ORAL
  Filled 2013-06-18 (×18): qty 1

## 2013-06-18 NOTE — Progress Notes (Signed)
[facility] PROGRESS NOTE   Jerry [name] ZOX:[id number] DOB: 03/02/1941 DOA: 06/12/2013 PCP: Thora Lance, MD  Brief narrative: Jerry [name] is an 72 y.o. male with a PMH of cirrhosis of the liver secondary to [facility], recurrent accumulating ascites requiring frequent paracentesis, hypertension, hyperlipidemia, diabetes, stage II-3 chronic kidney disease with baseline creatinine 1.2-1.6, chronic anemia with baseline hemoglobin around 11 mg/dL who was admitted on 81/19/1478 with altered mental status. Upon initial evaluation, the patient was hyponatremic with elevated ammonia levels and evidence of pneumonia on chest x-ray.  Assessment/Plan: Principal Problem:   Acute respiratory failure secondary to community-acquired pneumonia Initial chest x-ray showed pneumonia. He was initially treated with broad-spectrum antibiotics including vancomycin and cefepime which was later transitioned to oral Levaquin. Repeat chest x-ray showed improvement. Septic workup including urinary Legionella antigen studies and strep pneumonia antigen studies were negative, respiratory virus panel negative, blood cultures negative to date, sputum culture not representative of lower respiratory tract secretions. Continue supplemental oxygen and bronchodilator therapy as needed. Active Problems:   Ascites Paracentesis done 06/16/2013, 6.2 L of cloudy yellow fluid withdrawn. Given 25 mg of albumin.  Typically gets these done weekly.     Acute on chronic renal failure Thought to be prerenal versus hepatorenal syndrome. Did not respond to IV fluids. Nephrology consultation performed by Dr. Arlean Hopping on 06/16/2013. Baseline creatinine 1.2-1.6. Current creatinine slightly higher than usual baseline values.   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 Seen by Dr. Ladona Ridgel from the palliative care team on 06/17/2013. Patient remains a full code.   Hyponatremia Multifactorial with  cirrhosis physiology and volume depletion contributory. 3% saline initiated on 06/16/2013 under the direction of nephrology. Sodium steadily improving from a low of 114---> 125.   Diabetes mellitus, type II Hemoglobin A1c 7.1% on 06/02/2013. Continue Tradjenta, Lantus 15 units daily, and moderate SSI 3 times a day. CBGs 125-255.     Hepatic encephalopathy Continue lactulose.  Add Rifaximin.  Family reports frequent loose stools.   Protein-calorie malnutrition, severe Dietitian consultation requested.   Hyperkalemia Resolved with Kayexalate.   Leukocytosis, unspecified Resolved with treatment of pneumonia.  Code Status: Full. Family Communication: Wife/family updated at bedside. Disposition Plan: Home when stable.  IV access:  PICC line placed 06/16/2013  Medical Consultants:  None  Other Consultants:  Dietician  Anti-infectives: Vancomycin 12/25- 12/28  Cefepime 12/25- 12/28  levaquin 12/28. >>   HPI/Subjective: Jerry [name] is having 4-5 loose stools per day.  He tells me he "doesn't feel good", but denies headache, nausea/vomiting, pain.  Family reports ongoing lethargy, worse than baseline.  Objective: Filed Vitals:   06/18/13 0300 06/18/13 0400 06/18/13 0500 06/18/13 0600  BP: 99/57 98/54 98/63  119/67  Pulse: 89 89 93 99  Temp:  97.5 F (36.4 C)    TempSrc:  Axillary    Resp: 12 23 14 17   Height:      Weight:      SpO2: 96% 95% 96% 97%    Intake/Output Summary (Last 24 hours) at 06/18/13 2956 Last data filed at 06/18/13 0600  Gross per 24 hour  Intake   1920 ml  Output    625 ml  Net   1295 ml    Exam: Gen:  NAD, lethargic Cardiovascular:  Tachycardic, No M/R/G Respiratory:  Lungs CTAB Gastrointestinal:  Abdomen softly distended, NT, + BS Extremities:  No C/E/C  Data Reviewed: Basic Metabolic Panel:  Recent Labs Lab 06/12/13 1056  06/14/13 0840 06/15/13 0530  06/16/13 0926 06/16/13 1709  06/17/13 0510  06/17/13 1400 06/17/13 1830  06/17/13 2315 06/18/13 0225 06/18/13 0550  NA 116*  < > 115* 114*  < > 114* 116*  < > 122*  < > 119* 119* 121* 122* 125*  K 5.2*  < > 4.3 4.2  --  4.6 4.5  --  4.6  --   --   --   --   --   --   CL 82*  < > 81* 80*  --  82* 83*  --  88*  --   --   --   --   --   --   CO2 21  < > 23 21  --  19 19  --  19  --   --   --   --   --   --   GLUCOSE 253*  < > 292* 229*  --  218* 171*  --  142*  --   --   --   --   --   --   BUN 52*  < > 53* 57*  --  61* 61*  --  64*  --   --   --   --   --   --   CREATININE 1.43*  < > 1.41* 1.54*  --  1.76* 1.78*  --  1.75*  --   --   --   --   --   --   CALCIUM 9.1  < > 9.2 8.9  --  8.8 9.1  --  8.9  --   --   --   --   --   --   MG 3.0*  --   --   --   --   --   --   --   --   --   --   --   --   --   --   < > = values in this interval not displayed. GFR Estimated Creatinine Clearance: 37 ml/min (by C-G formula based on Cr of 1.75). Liver Function Tests:  Recent Labs Lab 06/12/13 0508 06/12/13 1056 06/13/13 0500  AST 72* 60* 64*  ALT 76* 69* 69*  ALKPHOS 285* 259* 262*  BILITOT 2.6* 2.6* 3.0*  PROT 6.8 6.5 6.2  ALBUMIN 3.6 3.3* 3.2*    Recent Labs Lab 06/12/13 0508 06/13/13 0500  AMMONIA 86* 73*    CBC:  Recent Labs Lab 06/12/13 0508 06/13/13 0500 06/14/13 0840 06/15/13 0530 06/17/13 0510  WBC 16.4* 14.7* 13.8* 14.6* 10.1  NEUTROABS 14.1*  --  10.5*  --   --   HGB 14.8 14.6 14.6 14.5 13.6  HCT 40.5 40.4 41.1 40.8 37.2*  MCV 88.4 88.6 90.1 89.1 88.8  PLT 133* 129* 137* 142* 111*   BNP (last 3 results)  Recent Labs  04/06/13 0518 04/07/13 0500 06/12/13 1056  PROBNP 73.5 66.0 447.5*   CBG:  Recent Labs Lab 06/16/13 2237 06/17/13 0843 06/17/13 1226 06/17/13 1705 06/17/13 2327  GLUCAP 156* 125* 255* 149* 210*   icrobiology Recent Results (from the past 240 hour(s))  CULTURE, BLOOD (ROUTINE X 2)     Status: None   Collection Time    06/12/13 10:56 AM      Result Value Range Status   Specimen Description BLOOD RIGHT  HAND   Final   Special Requests     Final   Value: BOTTLES DRAWN AEROBIC AND ANAEROBIC 5CC BOTH BOTTLES   Culture  Setup Time  Final   Value: 06/12/2013 14:52     Performed at [facility]   Culture     Final   Value:        BLOOD CULTURE RECEIVED NO GROWTH TO DATE CULTURE WILL BE HELD FOR 5 DAYS BEFORE ISSUING A FINAL NEGATIVE REPORT     Performed at [facility]   Report Status PENDING   Incomplete  CULTURE, BLOOD (ROUTINE X 2)     Status: None   Collection Time    06/12/13 11:00 AM      Result Value Range Status   Specimen Description BLOOD LEFT ARM   Final   Special Requests BOTTLES DRAWN AEROBIC ONLY 3CC   Final   Culture  Setup Time     Final   Value: 06/12/2013 14:52     Performed at [facility]   Culture     Final   Value:        BLOOD CULTURE RECEIVED NO GROWTH TO DATE CULTURE WILL BE HELD FOR 5 DAYS BEFORE ISSUING A FINAL NEGATIVE REPORT     Performed at [facility]   Report Status PENDING   Incomplete  RESPIRATORY VIRUS PANEL     Status: None   Collection Time    06/14/13  5:36 AM      Result Value Range Status   Source - RVPAN NOSE   Final   Respiratory Syncytial Virus A NOT DETECTED   Final   Respiratory Syncytial Virus B NOT DETECTED   Final   Influenza A NOT DETECTED   Final   Influenza B NOT DETECTED   Final   Parainfluenza 1 NOT DETECTED   Final   Parainfluenza 2 NOT DETECTED   Final   Parainfluenza 3 NOT DETECTED   Final   Metapneumovirus NOT DETECTED   Final   Rhinovirus NOT DETECTED   Final   Adenovirus NOT DETECTED   Final   Influenza A H1 NOT DETECTED   Final   Influenza A H3 NOT DETECTED   Final   Comment: (NOTE)           Normal Reference Range for each Analyte: NOT DETECTED     Testing performed using the Lum[employer] xTAG Respiratory Viral Panel test     kit.     This test was developed and its performance characteristics determined     by [facility]. It has not been cleared or approved by the [place]      Food and Drug Administration. This test is used for clinical purposes.     It should not be regarded as investigational or for research. This     laboratory is certified under the Clinical Laboratory Improvement     Amendments of 1988 (CLIA) as qualified to perform high complexity     clinical laboratory testing.     Performed at [facility]  MRSA PCR SCREENING     Status: Abnormal   Collection Time    06/16/13  8:37 PM      Result Value Range Status   MRSA by PCR POSITIVE (*) NEGATIVE Final   Comment:            The GeneXpert MRSA Assay (FDA     approved for NASAL specimens     only), is one component of a     comprehensive MRSA colonization     surveillance program. It is not     intended to diagnose MRSA  infection nor to guide or     monitor treatment for     MRSA infections.     RESULT CALLED TO, READ BACK BY AND VERIFIED WITH:     ALDRIDGE,D/2W @2219  ON 06/16/13 BY KARCZEWSKI,S.  CLOSTRIDIUM DIFFICILE BY PCR     Status: None   Collection Time    06/17/13  5:52 AM      Result Value Range Status   C difficile by pcr NEGATIVE  NEGATIVE Final   Comment: Performed at [facility]  CULTURE, EXPECTORATED SPUTUM-ASSESSMENT     Status: None   Collection Time    06/17/13  4:11 PM      Result Value Range Status   Specimen Description SPU   Final   Special Requests NONE   Final   Sputum evaluation     Final   Value: MICROSCOPIC FINDINGS SUGGEST THAT THIS SPECIMEN IS NOT REPRESENTATIVE OF LOWER RESPIRATORY SECRETIONS. PLEASE RECOLLECT.     RESULTS CALLED TO Sudie Bailey [id number] @ 1836 BY J SCOTTON   Report Status 06/17/2013 FINAL   Final     Procedures and Diagnostic Studies: Dg Chest 2 View  06/14/2013   CLINICAL DATA:  Cough.  FOLLOW UP pneumonia.  EXAM: CHEST  2 VIEW  COMPARISON:  06/12/2013  FINDINGS: Mild basilar lung opacity is noted, similar on the left and mildly increased on the right, most consistent with atelectasis. No convincing pneumonia. Lung  volumes are low accentuating the lung base opacity.  No pulmonary edema.  No pleural effusion or pneumothorax.  The heart, mediastinum and hila are unremarkable.  IMPRESSION: Mild lung base opacity as described most consistent with atelectasis.   Electronically Signed   By: Amie Portland M.D.   On: 06/14/2013 17:37   Dg Chest 2 View  06/12/2013   CLINICAL DATA:  Cough and nausea.  EXAM: CHEST  2 VIEW  COMPARISON:  Chest radiograph performed 06/02/2013  FINDINGS: The lungs are hypoexpanded. Mild left basilar opacity may reflect atelectasis or possibly mild pneumonia. There is no evidence of pleural effusion or pneumothorax.  The heart is normal in size; the mediastinal contour is within normal limits. No acute osseous abnormalities are seen.  IMPRESSION: Lungs hypoexpanded. Mild left basilar airspace opacity may reflect atelectasis or possibly mild pneumonia.   Electronically Signed   By: Roanna Raider M.D.   On: 06/12/2013 04:30   Dg Chest 2 View  06/02/2013   CLINICAL DATA:  Altered mental still  EXAM: CHEST  2 VIEW  COMPARISON:  Chest x-ray from yesterday  FINDINGS: Low lung volumes persist, with bibasilar atelectasis. No edema, pneumothorax, or consolidation. Borderline cardiomegaly.  IMPRESSION: Persistently low lung volumes, with basilar atelectasis.   Electronically Signed   By: Tiburcio Pea M.D.   On: 06/02/2013 03:55   Dg Chest 2 View  06/01/2013   CLINICAL DATA:  Cough, dyspnea  EXAM: CHEST  2 VIEW  COMPARISON:  04/06/2013.  FINDINGS: Low lung volumes. Areas of increased density project within the lung bases. The osseous structures are unremarkable. The cardiac silhouette is obscured.  IMPRESSION: Low lung volumes. Atelectasis versus infiltrate within the lung bases. Repeat evaluation with deeper inspiration recommended.   Electronically Signed   By: Salome Holmes M.D.   On: 06/01/2013 12:44   Ct Head Wo Contrast  06/02/2013   CLINICAL DATA:  Expressive aphasia  EXAM: CT HEAD WITHOUT  CONTRAST  TECHNIQUE: Contiguous axial images were obtained from the base of the skull through the vertex  without intravenous contrast.  COMPARISON:  10/02/2010  FINDINGS: Skull and Sinuses:No significant abnormality.  Orbits: No acute abnormality.  Brain: No evidence of acute abnormality, such as acute infarction, hemorrhage, hydrocephalus, or mass lesion/mass effect. Age appropriate cerebral volume loss.  IMPRESSION: No evidence of acute intracranial disease.   Electronically Signed   By: Tiburcio Pea M.D.   On: 06/02/2013 04:07   US Paracentesis  06/16/2013   CLINICAL DATA:  Cirrhosis, recurrent ascites, request for therapeutic paracentesis.  EXAM: ULTRASOUND GUIDED PARACENTESIS  TECHNIQUE: Survey ultrasound of the abdomen was performed and an appropriate skin entry site in the RLQ abdomen was selected. Skin site was marked, prepped with Betadine, and draped in usual sterile fashion, and infiltrated locally with 1% lidocaine. A 5 French multi-sidehole Yueh sheath needle was advanced into the peritoneal space until fluid could be aspirated. The sheath was advanced and the needle removed. 6.2 liters of cloudy yellow ascites were aspirated. No immediate complication.  IMPRESSION: Technically successful ultrasound guided paracentesis, removing 6.2 liters of ascites.  Read By:  Pattricia Boss PA-C   Electronically Signed   By: Richarda Overlie M.D.   On: 06/16/2013 15:11   US Paracentesis  06/10/2013   CLINICAL DATA:  Cirrhosis, recurrent ascites. Request is made for therapeutic paracentesis.  EXAM: ULTRASOUND GUIDED THERAPEUTIC PARACENTESIS  COMPARISON:  PREVIOUS PARACENTESIS ON 05/22/2013.  PROCEDURE: An ultrasound guided paracentesis was thoroughly discussed with the patient and questions answered. The benefits, risks, alternatives and complications were also discussed. The patient understands and wishes to proceed with the procedure. Written consent was obtained.  Ultrasound was performed to localize and mark  an adequate pocket of fluid in the left mid to lower quadrant of the abdomen. The area was then prepped and draped in the normal sterile fashion. 1% Lidocaine was used for local anesthesia. Under ultrasound guidance a 19 gauge Yueh catheter was introduced. Paracentesis was performed. The catheter was removed and a dressing applied.  Complications: None.  FINDINGS: A total of approximately 2.9 liters of turbid, yellow fluid was removed.  IMPRESSION: Successful ultrasound guided therapeutic paracentesis yielding 2.9 liters of ascites.  Read by: Jeananne  ,P.A.-C.   Electronically Signed   By: Simonne Come M.D.   On: 06/10/2013 13:02   US Paracentesis  06/05/2013   CLINICAL DATA:  Cirrhosis, recurrent ascites  EXAM: ULTRASOUND GUIDED PARACENTESIS  TECHNIQUE: Survey ultrasound of the abdomen was performed and an appropriate skin entry site in the RUQ abdomen was selected. Skin site was marked, prepped with Betadine, and draped in usual sterile fashion, and infiltrated locally with 1% lidocaine. A 5 French multisidehole Yueh sheath needle was advanced into the peritoneal space until fluid could be aspirated. The sheath was advanced and the needle removed. 4.5 liters of milky yellow colored ascites were aspirated. No immediate complication.  IMPRESSION: Technically successful ultrasound guided paracentesis, removing 4.5 liters of ascites.  Read By:  Pattricia Boss PA-C   Electronically Signed   By: Maryclare Bean M.D.   On: 06/05/2013 16:08   US Paracentesis  05/22/2013   CLINICAL DATA:  Cirrhosis, recurrent ascites  EXAM: ULTRASOUND GUIDED PARACENTESIS  TECHNIQUE: Survey ultrasound of the abdomen was performed and an appropriate skin entry site in the RLQ abdomen was selected. Skin site was marked, prepped with Betadine, and draped in usual sterile fashion, and infiltrated locally with 1% lidocaine. A 5 French multi-sidehole Yueh sheath needle was advanced into the peritoneal space until fluid could be aspirated. The  sheath was advanced  and the needle removed. 9.5 liters of milky yellow/light brown colorascites were aspirated. No immediate complication.  IMPRESSION: Technically successful ultrasound guided paracentesis, removing 9.5 liters of ascites.  Read By:  Pattricia Boss PA-C   Electronically Signed   By: Richarda Overlie M.D.   On: 05/22/2013 11:54    Scheduled Meds: . Chlorhexidine Gluconate Cloth  6 each Topical Q0600  . enoxaparin (LOVENOX) injection  40 mg Subcutaneous Q24H  . feeding supplement (GLUCERNA SHAKE)  237 mL Oral BID BM  . insulin aspart  0-15 Units Subcutaneous TID WC  . insulin glargine  15 Units Subcutaneous Daily  . lactose free nutrition  237 mL Oral Daily  . lactulose  10 g Oral BID  . levofloxacin  750 mg Oral Q48H  . linagliptin  5 mg Oral Daily  . mupirocin ointment  1 application Nasal BID  . pantoprazole  40 mg Oral BID AC  . sodium chloride  10-40 mL Intracatheter Q12H   Continuous Infusions: . sodium chloride (hypertonic) 45 mL/hr at 06/18/13 0454    Time spent: 35 minutes with > 50% of time discussing current diagnostic test results, clinical impression and plan of care.    LOS: 6 days   Enis Leatherwood  [facility] Pager [phone].   *Please note that the hospitalists switch teams on Wednesdays. Please call the flow manager at [phone] if you are having difficulty reaching the hospitalist taking care of this patient as she can update you and provide the most up-to-date pager number of provider caring for the patient. If 8PM-8AM, please contact night-coverage at [email or web address], password [facility]  06/18/2013, 7:27 AM

## 2013-06-18 NOTE — Progress Notes (Signed)
NUTRITION FOLLOW UP  Intervention:   Continue Glucerna Shakes BID Continue Boost Plus once daily Continue Snack once daily Provide Magic Cup once daily Encourage PO intake  Nutrition Dx:   Inadequate oral intake related to varied appetite as evidenced by 18% weight loss in less than 2 months; ongoing  Goal:   Pt to meet >/= 90% of their estimated nutrition needs; not met  Monitor:   PO intake; varies 0-100% Weight; additional 2 lb weight loss since admission Labs; high glucose, low sodium, low GFR, high BUN, elevated creatinine  Assessment:   RD consulted. Pt seen by RD 12/26, supplements added.  Per pt's wife pt's appetite is varied and he eats well some days and poorly others. Per nursing notes pt is eating 75-100% of some meals and 0-50% of other meals. Wife reports that pt only ate 2 ice creams for dinner last night. He has been drinking Glucerna Shakes 1-2 times daily. Continue to encouraged PO intake of meals, snacks, and supplements.   Height: Ht Readings from Last 1 Encounters:  06/16/13 5\' 9"  (1.753 m)    Weight Status:   Wt Readings from Last 1 Encounters:  06/18/13 174 lb 2.6 oz (79 kg)    Re-estimated needs:  Kcal: 2000-2200  Protein: 95-105 grams  Fluid: 2-2.2 L/day  Skin: intact  Diet Order: General   Intake/Output Summary (Last 24 hours) at 06/18/13 1128 Last data filed at 06/18/13 1100  Gross per 24 hour  Intake   1960 ml  Output    800 ml  Net   1160 ml    Last BM: 12/30   Labs:   Recent Labs Lab 06/12/13 1056  06/16/13 0926 06/16/13 1709  06/17/13 0510  06/18/13 0225 06/18/13 0550 06/18/13 1010  NA 116*  < > 114* 116*  < > 122*  < > 122* 125* 123*  K 5.2*  < > 4.6 4.5  --  4.6  --   --   --   --   CL 82*  < > 82* 83*  --  88*  --   --   --   --   CO2 21  < > 19 19  --  19  --   --   --   --   BUN 52*  < > 61* 61*  --  64*  --   --   --   --   CREATININE 1.43*  < > 1.76* 1.78*  --  1.75*  --   --   --   --   CALCIUM 9.1  < >  8.8 9.1  --  8.9  --   --   --   --   MG 3.0*  --   --   --   --   --   --   --   --   --   GLUCOSE 253*  < > 218* 171*  --  142*  --   --   --   --   < > = values in this interval not displayed.  CBG (last 3)   Recent Labs  06/17/13 1705 06/17/13 2327 06/18/13 0726  GLUCAP 149* 210* 179*    Scheduled Meds: . Chlorhexidine Gluconate Cloth  6 each Topical Q0600  . enoxaparin (LOVENOX) injection  40 mg Subcutaneous Q24H  . feeding supplement (GLUCERNA SHAKE)  237 mL Oral BID BM  . insulin aspart  0-15 Units Subcutaneous TID WC  . insulin glargine  15 Units Subcutaneous Daily  . lactose free nutrition  237 mL Oral Daily  . lactulose  10 g Oral BID  . linagliptin  5 mg Oral Daily  . mupirocin ointment  1 application Nasal BID  . pantoprazole  40 mg Oral BID AC  . rifaximin  550 mg Oral BID  . sodium chloride  10-40 mL Intracatheter Q12H    Continuous Infusions: . sodium chloride (hypertonic) 45 mL/hr at 06/18/13 0454    Ian Malkin RD, LDN Inpatient Clinical Dietitian Pager: [phone] After Hours Pager: [phone]

## 2013-06-18 NOTE — Progress Notes (Signed)
[facility] Progress Note    Subjective: wt down 2kg, patient still feels "bad", Na up to 125 then down to 123 today   Exam  Blood pressure 105/65, pulse 95, temperature 97.5 F (36.4 C), temperature source Axillary, resp. rate 14, height 5\' 9"  (1.753 m), weight 79 kg (174 lb 2.6 oz), SpO2 96.00%. Frail, older adult male lying on his side, no distress, looks uncomfortable No jvd, flat neck veins Clear bilat, no rales  RRR no M or rub Abd soft, nt/nd, mild ascites No leg or UE edema Neuro exam nonfocal, gen weakness  Urine Na < 20  Assessment/Plan:  1. Hyponatremia- due to cirrhosis and/or intravascular vol depletion. Somewhat better, will increase 3% saline to 55 cc/hr and try to get Na >128 then will stop. This may just recur off of 3%.  Will start salt tabs by mouth today as well if he can tolerate this.        2. CKD stage III 3. Advanced cirrhosis d/t NASH; is a hospice patient already 4. Hx HTN- BP 's low now 5. DM- on insulin   Vinson Moselle MD  pager [phone]    cell [phone]  06/18/2013, 3:12 PM   Recent Labs Lab 06/16/13 0926 06/16/13 1709  06/17/13 0510  06/18/13 0550 06/18/13 1010 06/18/13 1423  NA 114* 116*  < > 122*  < > 125* 123* 123*  K 4.6 4.5  --  4.6  --   --   --   --   CL 82* 83*  --  88*  --   --   --   --   CO2 19 19  --  19  --   --   --   --   GLUCOSE 218* 171*  --  142*  --   --   --   --   BUN 61* 61*  --  64*  --   --   --   --   CREATININE 1.76* 1.78*  --  1.75*  --   --   --   --   CALCIUM 8.8 9.1  --  8.9  --   --   --   --   < > = values in this interval not displayed.  Recent Labs Lab 06/12/13 0508 06/12/13 1056 06/13/13 0500  AST 72* 60* 64*  ALT 76* 69* 69*  ALKPHOS 285* 259* 262*  BILITOT 2.6* 2.6* 3.0*  PROT 6.8 6.5 6.2  ALBUMIN 3.6 3.3* 3.2*    Recent Labs Lab 06/12/13 0508  06/14/13 0840 06/15/13 0530 06/17/13 0510  WBC 16.4*  < > 13.8* 14.6* 10.1  NEUTROABS 14.1*  --  10.5*  --   --   HGB  14.8  < > 14.6 14.5 13.6  HCT 40.5  < > 41.1 40.8 37.2*  MCV 88.4  < > 90.1 89.1 88.8  PLT 133*  < > 137* 142* 111*  < > = values in this interval not displayed. . Chlorhexidine Gluconate Cloth  6 each Topical Q0600  . enoxaparin (LOVENOX) injection  40 mg Subcutaneous Q24H  . feeding supplement (GLUCERNA SHAKE)  237 mL Oral BID BM  . insulin aspart  0-15 Units Subcutaneous TID WC  . insulin glargine  15 Units Subcutaneous Daily  . lactose free nutrition  237 mL Oral Daily  . lactulose  10 g Oral BID  . linagliptin  5 mg Oral Daily  . mupirocin ointment  1 application Nasal BID  .  pantoprazole  40 mg Oral BID AC  . rifaximin  550 mg Oral BID  . sodium chloride  10-40 mL Intracatheter Q12H   . sodium chloride (hypertonic) 45 mL/hr at 06/18/13 0454   albuterol, ondansetron (ZOFRAN) IV, sodium chloride

## 2013-06-18 NOTE — Progress Notes (Signed)
ANTIBIOTIC CONSULT NOTE - FOLLOW UP  Pharmacy Consult for Levaquin Indication: pneumonia  No Known Allergies  Patient Measurements: Height: 5\' 9"  (175.3 cm) Weight: 174 lb 2.6 oz (79 kg) IBW/kg (Calculated) : 70.7  Vital Signs: Temp: 97.6 F (36.4 C) (12/31 0800) Temp src: Oral (12/31 0800) BP: 105/57 mmHg (12/31 0800) Pulse Rate: 99 (12/31 0800) Intake/Output from previous day: 12/30 0701 - 12/31 0700 In: 1965 [P.O.:1000; I.V.:965] Out: 625 [Urine:625]  Labs:  Recent Labs  06/16/13 0926 06/16/13 1709 06/17/13 0510  WBC  --   --  10.1  HGB  --   --  13.6  PLT  --   --  111*  CREATININE 1.76* 1.78* 1.75*   Estimated Creatinine Clearance: 37 ml/min (by C-G formula based on Cr of 1.75).  Anti-infectives: 12/25>>vancomycin >> 12/28 12/25 >>cefepime >> 12/28 12/28 >> levofloxacin >> 12/31   Assessment: 55 YOM presents with altered mental status, he was recently admitted 06/02/13 for confusion. He has h/o NASH requiring large-volume paracentesis frequently. Orders to start vancomycin and cefepime for pneumonia. Given vancomycin and zosyn x 1 12/25am. Antibiotics narrowed to PO levofloxacin 12/28 and renally adjusted per pharmacy.  12/31: D4 Levaquin PO (D7 abx s/p vanc/cefepime)   Tmax: afebrile  WBCs: 10.1 (12/30)  CKD: SCr (1.41 > 1.54 > 1.75 > 1.78 > 1.75) increasing but slightly down today.  CrCl 37 CG  Noted [facility] progress note 12/30 "Repeat cxr shows improvement and his antibiotics were transitioned to oral levaquin. Please discontinue oral levaquin after tomorrow's dose. "  Goal of Therapy:  Appropriate abx dosing, eradication of infection.   Plan:   Levaquin 750mg  PO q48h. Will enter stop date for 12/31 per MD note request.    Pharmacy to s/o.  Please re-consult for any further needs.   Lynann Beaver PharmD, BCPS Pager [phone] 06/18/2013 9:44 AM

## 2013-06-18 NOTE — Progress Notes (Signed)
Room SL 1235 - Jerry [name] - [facility] RN Visit-R.Sangeeta Youse RN  Related admission to [facility] diagnosis of chronic liver disease.  Pt is FULL code.    Pt alert & oriented, lying in bed, without complaints of pain or discomfort.    Wife, son & dtr present. Patient's home medication list is on shadow chart.   Per PMT consult yesterday - wife states she didn't fully understand hospice philosophy.  Offered to answer any  Questions for the family - wife feels more comfortable now, but continues to keep pt a full code.  No other family needs at this time.  Please call [facility] @ [phone]- ask for RN Liaison or after hours,ask for on-call RN with any hospice needs.   Thank you.  Joneen Boers, RN  [facility]  Hospice Liaison  [phone])

## 2013-06-19 LAB — CBC
HCT: 44.1 % (ref 39.0–52.0)
Hemoglobin: 15.5 g/dL (ref 13.0–17.0)
MCH: 32.2 pg (ref 26.0–34.0)
MCHC: 35.1 g/dL (ref 30.0–36.0)
MCV: 91.7 fL (ref 78.0–100.0)
Platelets: 119 10*3/uL — ABNORMAL LOW (ref 150–400)
RBC: 4.81 MIL/uL (ref 4.22–5.81)
RDW: 16.7 % — ABNORMAL HIGH (ref 11.5–15.5)
WBC: 11.7 10*3/uL — ABNORMAL HIGH (ref 4.0–10.5)

## 2013-06-19 LAB — COMPREHENSIVE METABOLIC PANEL
ALT: 90 U/L — ABNORMAL HIGH (ref 0–53)
AST: 100 U/L — ABNORMAL HIGH (ref 0–37)
Albumin: 2.8 g/dL — ABNORMAL LOW (ref 3.5–5.2)
Alkaline Phosphatase: 326 U/L — ABNORMAL HIGH (ref 39–117)
BUN: 62 mg/dL — ABNORMAL HIGH (ref 6–23)
CO2: 18 mEq/L — ABNORMAL LOW (ref 19–32)
Calcium: 8.7 mg/dL (ref 8.4–10.5)
Chloride: 97 mEq/L (ref 96–112)
Creatinine, Ser: 1.72 mg/dL — ABNORMAL HIGH (ref 0.50–1.35)
GFR calc Af Amer: 43 mL/min — ABNORMAL LOW (ref >90)
GFR calc non Af Amer: 37 mL/min — ABNORMAL LOW (ref >90)
Glucose, Bld: 227 mg/dL — ABNORMAL HIGH (ref 70–99)
Potassium: 4.5 mEq/L (ref 3.7–5.3)
Sodium: 127 mEq/L — ABNORMAL LOW (ref 137–147)
Total Bilirubin: 1.4 mg/dL — ABNORMAL HIGH (ref 0.3–1.2)
Total Protein: 6.1 g/dL (ref 6.0–8.3)

## 2013-06-19 LAB — GLUCOSE, CAPILLARY
GLUCOSE-CAPILLARY: 250 mg/dL — AB (ref 70–99)
Glucose-Capillary: 184 mg/dL — ABNORMAL HIGH (ref 70–99)
Glucose-Capillary: 275 mg/dL — ABNORMAL HIGH (ref 70–99)
Glucose-Capillary: 182 mg/dL — ABNORMAL HIGH (ref 70–99)

## 2013-06-19 LAB — AMMONIA: Ammonia: 39 umol/L (ref 11–60)

## 2013-06-19 LAB — SODIUM: Sodium: 128 mEq/L — ABNORMAL LOW (ref 137–147)

## 2013-06-19 MED ORDER — MENTHOL 3 MG MT LOZG
1.0000 | LOZENGE | OROMUCOSAL | Status: DC | PRN
  Filled 2013-06-19 (×2): qty 9

## 2013-06-19 MED ORDER — INSULIN ASPART 100 UNIT/ML ~~LOC~~ SOLN
0.0000 [IU] | Freq: Every day | SUBCUTANEOUS | Status: DC
  Administered 2013-06-19 – 2013-06-23 (×2): 2 [IU] via SUBCUTANEOUS

## 2013-06-19 MED ORDER — INSULIN GLARGINE 100 UNIT/ML ~~LOC~~ SOLN
20.0000 [IU] | Freq: Every day | SUBCUTANEOUS | Status: DC
  Administered 2013-06-19: 20 [IU] via SUBCUTANEOUS
  Filled 2013-06-19 (×2): qty 0.2

## 2013-06-19 MED ORDER — LACTULOSE 10 GM/15ML PO SOLN
10.0000 g | Freq: Every day | ORAL | Status: DC
Start: 2013-06-19 — End: 2013-06-20
  Administered 2013-06-19 – 2013-06-20 (×2): 10 g via ORAL
  Filled 2013-06-19: qty 15

## 2013-06-19 MED ORDER — INSULIN ASPART 100 UNIT/ML ~~LOC~~ SOLN
0.0000 [IU] | Freq: Three times a day (TID) | SUBCUTANEOUS | Status: DC
  Administered 2013-06-19: 11 [IU] via SUBCUTANEOUS
  Administered 2013-06-19 (×2): 4 [IU] via SUBCUTANEOUS
  Administered 2013-06-20 – 2013-06-21 (×3): 7 [IU] via SUBCUTANEOUS
  Administered 2013-06-21: 08:00:00 3 [IU] via SUBCUTANEOUS
  Administered 2013-06-21: 13:00:00 11 [IU] via SUBCUTANEOUS
  Administered 2013-06-22 (×2): 4 [IU] via SUBCUTANEOUS
  Administered 2013-06-23: 7 [IU] via SUBCUTANEOUS
  Administered 2013-06-23 – 2013-06-25 (×3): 4 [IU] via SUBCUTANEOUS

## 2013-06-19 NOTE — Progress Notes (Signed)
[facility] Progress Note    Subjective: Na up to 128 today, 3% saline off since earlier this am.  Feeling bad still but looks better.    Exam  Blood pressure 104/72, pulse 100, temperature 97.4 F (36.3 C), temperature source Oral, resp. rate 13, height 5\' 9"  (1.753 m), weight 83.5 kg (184 lb 1.4 oz), SpO2 96.00%. Older adult male in distress, looks better today, more awake, lying on side No jvd, flat neck veins Clear bilat, no rales  RRR no M or rub Abd soft, nt/nd, ascites is increasing daily No leg or UE edema Neuro exam nonfocal, gen weakness  Urine Na < 20, Uosm 500-650  Assessment/Plan:  1. Hyponatremia- due to cirrhosis most likely.  S/P 3% saline, Na 128 today.  Would continue fluid restriction 1000-1200 per day and salt tablets 2gm bid for now. The salt tabs can be increased up to 3 gm tid. Goal serum Na would be over 128, reduce salt tabs if Na > 136.  Will sign off, please call as needed.          2. CKD stage III 3. Advanced cirrhosis d/t NASH 4. DM- on insulin   Vinson Moselle MD  pager [phone]    cell [phone]  06/19/2013, 2:34 PM   Recent Labs Lab 06/16/13 1709  06/17/13 0510  06/18/13 2220 06/19/13 0415 06/19/13 0433  NA 116*  < > 122*  < > 125* 127* 128*  K 4.5  --  4.6  --   --  4.5  --   CL 83*  --  88*  --   --  97  --   CO2 19  --  19  --   --  18*  --   GLUCOSE 171*  --  142*  --   --  227*  --   BUN 61*  --  64*  --   --  62*  --   CREATININE 1.78*  --  1.75*  --   --  1.72*  --   CALCIUM 9.1  --  8.9  --   --  8.7  --   < > = values in this interval not displayed.  Recent Labs Lab 06/13/13 0500 06/19/13 0415  AST 64* 100*  ALT 69* 90*  ALKPHOS 262* 326*  BILITOT 3.0* 1.4*  PROT 6.2 6.1  ALBUMIN 3.2* 2.8*    Recent Labs Lab 06/14/13 0840 06/15/13 0530 06/17/13 0510 06/19/13 0415  WBC 13.8* 14.6* 10.1 11.7*  NEUTROABS 10.5*  --   --   --   HGB 14.6 14.5 13.6 15.5  HCT 41.1 40.8 37.2* 44.1  MCV 90.1 89.1 88.8  91.7  PLT 137* 142* 111* 119*   . Chlorhexidine Gluconate Cloth  6 each Topical Q0600  . enoxaparin (LOVENOX) injection  40 mg Subcutaneous Q24H  . feeding supplement (GLUCERNA SHAKE)  237 mL Oral BID BM  . insulin aspart  0-20 Units Subcutaneous TID WC  . insulin aspart  0-5 Units Subcutaneous QHS  . insulin glargine  20 Units Subcutaneous Daily  . lactose free nutrition  237 mL Oral Daily  . lactulose  10 g Oral Daily  . linagliptin  5 mg Oral Daily  . mupirocin ointment  1 application Nasal BID  . pantoprazole  40 mg Oral BID AC  . rifaximin  550 mg Oral BID  . sodium chloride  10-40 mL Intracatheter Q12H  . sodium chloride  2 g Oral BID WC  albuterol, menthol-cetylpyridinium, ondansetron (ZOFRAN) IV, sodium chloride

## 2013-06-19 NOTE — Progress Notes (Signed)
Bladder scanned patient due to low urine output. Upon procedure patient noted to have more than of urine in bladder. Informed MD RAMA of results. Given verbal for Q6 prn in and out catheters.

## 2013-06-19 NOTE — Progress Notes (Signed)
[facility] MSW note: This is a hospice related admission. MSW met with son-David. Spouse not present on visit. Son discussed how spouse is now uncertain if she will be able to care for pt at home due to his increasing care needs. Son asked about BP. MSW educated son on BP medically eligibility of a 2 weeks or less prognosis. MSW discussed with son about future decisions pt and/or family may have to face regarding pt's paracentesis treatments. MSW encouraged son to discuss with doctor what to expect from pt's illness. Pt remains a full code at this time. Son discussed how he understands that PT may evaluate pt. Son aware that pt is weaker and may need more care than spouse can do at home. MSW offered support and education. MSW discussed case with Mike, staff RN. Please call with questions or concerns.  ° °Debbie Garner, MSW [phone] °

## 2013-06-19 NOTE — Progress Notes (Signed)
[facility] PROGRESS NOTE   Jerry [name] YTW:[id number] DOB: Jun 02, 1941 DOA: 06/12/2013 PCP: Simona Huh, MD  Brief narrative: Jerry [name] is an 73 y.o. male with a PMH of cirrhosis of the liver secondary to [facility], recurrent accumulating ascites requiring frequent paracentesis, hypertension, hyperlipidemia, diabetes, stage II-3 chronic kidney disease with baseline creatinine 1.2-1.6, chronic anemia with baseline hemoglobin around 11 mg/dL who was admitted on 06/12/2013 with altered mental status. Upon initial evaluation, the patient was hyponatremic with elevated ammonia levels and evidence of pneumonia on chest x-ray.  Assessment/Plan: Principal Problem:   Acute respiratory failure secondary to community-acquired pneumonia Initial chest x-ray showed pneumonia. He was initially treated with broad-spectrum antibiotics including vancomycin and cefepime which was later transitioned to oral Levaquin. Repeat chest x-ray showed improvement. Septic workup including urinary Legionella antigen studies and strep pneumonia antigen studies were negative, respiratory virus panel negative, blood cultures negative to date, sputum culture not representative of lower respiratory tract secretions. Continue supplemental oxygen and bronchodilator therapy as needed. Active Problems:   Diarrhea Decrease Lactulose.  C. Diff negative.   Ascites Paracentesis done 06/16/2013, 6.2 L of cloudy yellow fluid withdrawn. Given 25 mg of albumin.  Typically gets these done weekly.     Acute on chronic renal failure Thought to be prerenal versus hepatorenal syndrome. Did not respond to IV fluids. Nephrology consultation performed by Dr. Jonnie Finner on 06/16/2013. Baseline creatinine 1.2-1.6. Current creatinine slightly higher than usual baseline values.   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 Seen by Dr. Lovena Le from the palliative care team on 06/17/2013. Patient remains a full  code.   Hyponatremia Multifactorial with cirrhosis physiology and volume depletion contributory. 3% saline initiated on 06/16/2013 under the direction of nephrology. Sodium steadily improving from a low of 114---> 125--->127.   Diabetes mellitus, type II Hemoglobin A1c 7.1% on 06/02/2013. Continue Tradjenta, Lantus 15 units daily, and moderate SSI 3 times a day. CBGs 179-321.  We'll increase Lantus to 20 units and change SSI to resistant scale.   Hepatic encephalopathy Continue lactulose and Rifaximin.  Ammonia level 39 this morning.   Protein-calorie malnutrition, severe Being followed by dietitian. Continue supplements.   Hyperkalemia Resolved with Kayexalate.   Leukocytosis, unspecified Resolved with treatment of pneumonia.  Code Status: Full. Family Communication: Wife/family updated at bedside. Disposition Plan: Home when stable.  IV access:  PICC line placed 06/16/2013  Medical Consultants:  None  Other Consultants:  Dietician  Anti-infectives: Vancomycin 12/25- 12/28  Cefepime 12/25- 12/28  levaquin 12/28. >>   HPI/Subjective: Jerry [name] continues to have frequent loose stools.  More awake and alert.  Appetite remains poor.  Complains of dry mouth.  Objective: Filed Vitals:   06/19/13 0000 06/19/13 0200 06/19/13 0400 06/19/13 0413  BP: 88/61 111/65  128/79  Pulse: 103 101  103  Temp: 98.2 F (36.8 C)  97.4 F (36.3 C)   TempSrc: Oral  Oral   Resp: '14 13  15  ' Height:      Weight:   83.5 kg (184 lb 1.4 oz)   SpO2: 94% 95%  95%    Intake/Output Summary (Last 24 hours) at 06/19/13 7711 Last data filed at 06/19/13 0400  Gross per 24 hour  Intake 1218.67 ml  Output    475 ml  Net 743.67 ml    Exam: Gen:  NAD, more alert Cardiovascular:  Tachycardic, No M/R/G Respiratory:  Lungs CTAB Gastrointestinal:  Abdomen softly distended, NT, + BS Extremities:  No C/E/C  Data Reviewed:  Basic Metabolic Panel:  Recent Labs Lab 06/12/13 1056   06/15/13 0530  06/16/13 0926 06/16/13 1709  06/17/13 0510  06/18/13 1010 06/18/13 1423 06/18/13 2040 06/18/13 2220 06/19/13 0415  NA 116*  < > 114*  < > 114* 116*  < > 122*  < > 123* 123* 147 125* 127*  K 5.2*  < > 4.2  --  4.6 4.5  --  4.6  --   --   --   --   --  4.5  CL 82*  < > 80*  --  82* 83*  --  88*  --   --   --   --   --  97  CO2 21  < > 21  --  19 19  --  19  --   --   --   --   --  18*  GLUCOSE 253*  < > 229*  --  218* 171*  --  142*  --   --   --   --   --  227*  BUN 52*  < > 57*  --  61* 61*  --  64*  --   --   --   --   --  62*  CREATININE 1.43*  < > 1.54*  --  1.76* 1.78*  --  1.75*  --   --   --   --   --  1.72*  CALCIUM 9.1  < > 8.9  --  8.8 9.1  --  8.9  --   --   --   --   --  8.7  MG 3.0*  --   --   --   --   --   --   --   --   --   --   --   --   --   < > = values in this interval not displayed. GFR Estimated Creatinine Clearance: 37.7 ml/min (by C-G formula based on Cr of 1.72). Liver Function Tests:  Recent Labs Lab 06/12/13 1056 06/13/13 0500 06/19/13 0415  AST 60* 64* 100*  ALT 69* 69* 90*  ALKPHOS 259* 262* 326*  BILITOT 2.6* 3.0* 1.4*  PROT 6.5 6.2 6.1  ALBUMIN 3.3* 3.2* 2.8*    Recent Labs Lab 06/13/13 0500 06/19/13 0414  AMMONIA 73* 39    CBC:  Recent Labs Lab 06/13/13 0500 06/14/13 0840 06/15/13 0530 06/17/13 0510 06/19/13 0415  WBC 14.7* 13.8* 14.6* 10.1 11.7*  NEUTROABS  --  10.5*  --   --   --   HGB 14.6 14.6 14.5 13.6 15.5  HCT 40.4 41.1 40.8 37.2* 44.1  MCV 88.6 90.1 89.1 88.8 91.7  PLT 129* 137* 142* 111* 119*   BNP (last 3 results)  Recent Labs  04/06/13 0518 04/07/13 0500 06/12/13 1056  PROBNP 73.5 66.0 447.5*   CBG:  Recent Labs Lab 06/17/13 2327 06/18/13 0726 06/18/13 1133 06/18/13 1638 06/18/13 2223  GLUCAP 210* 179* 251* 220* 321*   icrobiology Recent Results (from the past 240 hour(s))  CULTURE, BLOOD (ROUTINE X 2)     Status: None   Collection Time    06/12/13 10:56 AM      Result Value  Range Status   Specimen Description BLOOD RIGHT HAND   Final   Special Requests     Final   Value: BOTTLES DRAWN AEROBIC AND ANAEROBIC 5CC BOTH BOTTLES   Culture  Setup Time     Final  Value: 06/12/2013 14:52     Performed at [facility]   Culture     Final   Value: NO GROWTH 5 DAYS     Performed at [facility]   Report Status 06/18/2013 FINAL   Final  CULTURE, BLOOD (ROUTINE X 2)     Status: None   Collection Time    06/12/13 11:00 AM      Result Value Range Status   Specimen Description BLOOD LEFT ARM   Final   Special Requests BOTTLES DRAWN AEROBIC ONLY 3CC   Final   Culture  Setup Time     Final   Value: 06/12/2013 14:52     Performed at [facility]   Culture     Final   Value: NO GROWTH 5 DAYS     Performed at [facility]   Report Status 06/18/2013 FINAL   Final  RESPIRATORY VIRUS PANEL     Status: None   Collection Time    06/14/13  5:36 AM      Result Value Range Status   Source - RVPAN NOSE   Final   Respiratory Syncytial Virus A NOT DETECTED   Final   Respiratory Syncytial Virus B NOT DETECTED   Final   Influenza A NOT DETECTED   Final   Influenza B NOT DETECTED   Final   Parainfluenza 1 NOT DETECTED   Final   Parainfluenza 2 NOT DETECTED   Final   Parainfluenza 3 NOT DETECTED   Final   Metapneumovirus NOT DETECTED   Final   Rhinovirus NOT DETECTED   Final   Adenovirus NOT DETECTED   Final   Influenza A H1 NOT DETECTED   Final   Influenza A H3 NOT DETECTED   Final   Comment: (NOTE)           Normal Reference Range for each Analyte: NOT DETECTED     Testing performed using the Luminex xTAG Respiratory Viral Panel test     kit.     This test was developed and its performance characteristics determined     by [facility]. It has not been cleared or approved by the [place]     Food and Drug Administration. This test is used for clinical purposes.     It should not be regarded as investigational or for research. This      laboratory is certified under the [employer] (CLIA) as qualified to perform high complexity     clinical laboratory testing.     Performed at [facility] PCR SCREENING     Status: Abnormal   Collection Time    06/16/13  8:37 PM      Result Value Range Status   MRSA by PCR POSITIVE (*) NEGATIVE Final   Comment:            The GeneXpert MRSA Assay (FDA     approved for NASAL specimens     only), is one component of a     comprehensive MRSA colonization     surveillance program. It is not     intended to diagnose MRSA     infection nor to guide or     monitor treatment for     MRSA infections.     RESULT CALLED TO, READ BACK BY AND VERIFIED WITH:     ALDRIDGE,D/2W '@2219'  ON 06/16/13 BY KARCZEWSKI,S.  CLOSTRIDIUM DIFFICILE BY  PCR     Status: None   Collection Time    06/17/13  5:52 AM      Result Value Range Status   C difficile by pcr NEGATIVE  NEGATIVE Final   Comment: Performed at [facility], EXPECTORATED SPUTUM-ASSESSMENT     Status: None   Collection Time    06/17/13  4:11 PM      Result Value Range Status   Specimen Description SPU   Final   Special Requests NONE   Final   Sputum evaluation     Final   Value: MICROSCOPIC FINDINGS SUGGEST THAT THIS SPECIMEN IS NOT REPRESENTATIVE OF LOWER RESPIRATORY SECRETIONS. PLEASE RECOLLECT.     RESULTS CALLED TO Dewain Penning [id number] @ [facility]   Report Status 06/17/2013 FINAL   Final     Procedures and Diagnostic Studies: Dg Chest 2 View  06/14/2013   CLINICAL DATA:  Cough.  FOLLOW UP pneumonia.  EXAM: CHEST  2 VIEW  COMPARISON:  06/12/2013  FINDINGS: Mild basilar lung opacity is noted, similar on the left and mildly increased on the right, most consistent with atelectasis. No convincing pneumonia. Lung volumes are low accentuating the lung base opacity.  No pulmonary edema.  No pleural effusion or pneumothorax.  The heart, mediastinum and hila are  unremarkable.  IMPRESSION: Mild lung base opacity as described most consistent with atelectasis.   Electronically Signed   By: Lajean Manes M.D.   On: 06/14/2013 17:37   Dg Chest 2 View  06/12/2013   CLINICAL DATA:  Cough and nausea.  EXAM: CHEST  2 VIEW  COMPARISON:  Chest radiograph performed 06/02/2013  FINDINGS: The lungs are hypoexpanded. Mild left basilar opacity may reflect atelectasis or possibly mild pneumonia. There is no evidence of pleural effusion or pneumothorax.  The heart is normal in size; the mediastinal contour is within normal limits. No acute osseous abnormalities are seen.  IMPRESSION: Lungs hypoexpanded. Mild left basilar airspace opacity may reflect atelectasis or possibly mild pneumonia.   Electronically Signed   By: Garald Balding M.D.   On: 06/12/2013 04:30   Dg Chest 2 View  06/02/2013   CLINICAL DATA:  Altered mental still  EXAM: CHEST  2 VIEW  COMPARISON:  Chest x-ray from yesterday  FINDINGS: Low lung volumes persist, with bibasilar atelectasis. No edema, pneumothorax, or consolidation. Borderline cardiomegaly.  IMPRESSION: Persistently low lung volumes, with basilar atelectasis.   Electronically Signed   By: Jorje Guild M.D.   On: 06/02/2013 03:55   Dg Chest 2 View  06/01/2013   CLINICAL DATA:  Cough, dyspnea  EXAM: CHEST  2 VIEW  COMPARISON:  04/06/2013.  FINDINGS: Low lung volumes. Areas of increased density project within the lung bases. The osseous structures are unremarkable. The cardiac silhouette is obscured.  IMPRESSION: Low lung volumes. Atelectasis versus infiltrate within the lung bases. Repeat evaluation with deeper inspiration recommended.   Electronically Signed   By: Margaree Mackintosh M.D.   On: 06/01/2013 12:44   Ct Head Wo Contrast  06/02/2013   CLINICAL DATA:  Expressive aphasia  EXAM: CT HEAD WITHOUT CONTRAST  TECHNIQUE: Contiguous axial images were obtained from the base of the skull through the vertex without intravenous contrast.  COMPARISON:   10/02/2010  FINDINGS: Skull and Sinuses:No significant abnormality.  Orbits: No acute abnormality.  Brain: No evidence of acute abnormality, such as acute infarction, hemorrhage, hydrocephalus, or mass lesion/mass effect. Age appropriate cerebral volume loss.  IMPRESSION: No evidence  of acute intracranial disease.   Electronically Signed   By: Jorje Guild M.D.   On: 06/02/2013 04:07   US Paracentesis  06/16/2013   CLINICAL DATA:  Cirrhosis, recurrent ascites, request for therapeutic paracentesis.  EXAM: ULTRASOUND GUIDED PARACENTESIS  TECHNIQUE: Survey ultrasound of the abdomen was performed and an appropriate skin entry site in the RLQ abdomen was selected. Skin site was marked, prepped with Betadine, and draped in usual sterile fashion, and infiltrated locally with 1% lidocaine. A 5 French multi-sidehole Yueh sheath needle was advanced into the peritoneal space until fluid could be aspirated. The sheath was advanced and the needle removed. 6.2 liters of cloudy yellow ascites were aspirated. No immediate complication.  IMPRESSION: Technically successful ultrasound guided paracentesis, removing 6.2 liters of ascites.  Read By:  Tsosie Billing PA-C   Electronically Signed   By: Markus Daft M.D.   On: 06/16/2013 15:11   US Paracentesis  06/10/2013   CLINICAL DATA:  Cirrhosis, recurrent ascites. Request is made for therapeutic paracentesis.  EXAM: ULTRASOUND GUIDED THERAPEUTIC PARACENTESIS  COMPARISON:  PREVIOUS PARACENTESIS ON 05/22/2013.  PROCEDURE: An ultrasound guided paracentesis was thoroughly discussed with the patient and questions answered. The benefits, risks, alternatives and complications were also discussed. The patient understands and wishes to proceed with the procedure. Written consent was obtained.  Ultrasound was performed to localize and mark an adequate pocket of fluid in the left mid to lower quadrant of the abdomen. The area was then prepped and draped in the normal sterile fashion. 1%  Lidocaine was used for local anesthesia. Under ultrasound guidance a 19 gauge Yueh catheter was introduced. Paracentesis was performed. The catheter was removed and a dressing applied.  Complications: None.  FINDINGS: A total of approximately 2.9 liters of turbid, yellow fluid was removed.  IMPRESSION: Successful ultrasound guided therapeutic paracentesis yielding 2.9 liters of ascites.  Read by: Rowe Robert ,P.A.-C.   Electronically Signed   By: Sandi Mariscal M.D.   On: 06/10/2013 13:02   US Paracentesis  06/05/2013   CLINICAL DATA:  Cirrhosis, recurrent ascites  EXAM: ULTRASOUND GUIDED PARACENTESIS  TECHNIQUE: Survey ultrasound of the abdomen was performed and an appropriate skin entry site in the RUQ abdomen was selected. Skin site was marked, prepped with Betadine, and draped in usual sterile fashion, and infiltrated locally with 1% lidocaine. A 5 French multisidehole Yueh sheath needle was advanced into the peritoneal space until fluid could be aspirated. The sheath was advanced and the needle removed. 4.5 liters of milky yellow colored ascites were aspirated. No immediate complication.  IMPRESSION: Technically successful ultrasound guided paracentesis, removing 4.5 liters of ascites.  Read By:  Tsosie Billing PA-C   Electronically Signed   By: Maryclare Bean M.D.   On: 06/05/2013 16:08   US Paracentesis  05/22/2013   CLINICAL DATA:  Cirrhosis, recurrent ascites  EXAM: ULTRASOUND GUIDED PARACENTESIS  TECHNIQUE: Survey ultrasound of the abdomen was performed and an appropriate skin entry site in the RLQ abdomen was selected. Skin site was marked, prepped with Betadine, and draped in usual sterile fashion, and infiltrated locally with 1% lidocaine. A 5 French multi-sidehole Yueh sheath needle was advanced into the peritoneal space until fluid could be aspirated. The sheath was advanced and the needle removed. 9.5 liters of milky yellow/light brown colorascites were aspirated. No immediate complication.   IMPRESSION: Technically successful ultrasound guided paracentesis, removing 9.5 liters of ascites.  Read By:  Tsosie Billing PA-C   Electronically Signed   By: Markus Daft  M.D.   On: 05/22/2013 11:54    Scheduled Meds: . Chlorhexidine Gluconate Cloth  6 each Topical Q0600  . enoxaparin (LOVENOX) injection  40 mg Subcutaneous Q24H  . feeding supplement (GLUCERNA SHAKE)  237 mL Oral BID BM  . insulin aspart  0-15 Units Subcutaneous TID WC  . insulin glargine  15 Units Subcutaneous Daily  . lactose free nutrition  237 mL Oral Daily  . lactulose  10 g Oral BID  . linagliptin  5 mg Oral Daily  . mupirocin ointment  1 application Nasal BID  . pantoprazole  40 mg Oral BID AC  . rifaximin  550 mg Oral BID  . sodium chloride  10-40 mL Intracatheter Q12H  . sodium chloride  2 g Oral BID WC   Continuous Infusions:    Time spent: 35 minutes with > 50% of time discussing current diagnostic test results, clinical impression and plan of care.    LOS: 7 days   [facility] Pager [phone].   *Please note that the hospitalists switch teams on Wednesdays. Please call the flow manager at [phone] if you are having difficulty reaching the hospitalist taking care of this patient as she can update you and provide the most up-to-date pager number of provider caring for the patient. If 8PM-8AM, please contact night-coverage at [email or web address], password [email or web address]  06/19/2013, 7:14 AM

## 2013-06-19 NOTE — Progress Notes (Signed)
In and out completed. Only able to out 300 cc of urine from patient. Will continue to monitor

## 2013-06-20 ENCOUNTER — Inpatient Hospital Stay (HOSPITAL_COMMUNITY)

## 2013-06-20 DIAGNOSIS — R339 Retention of urine, unspecified: Secondary | ICD-10-CM | POA: Diagnosis present

## 2013-06-20 LAB — GLUCOSE, CAPILLARY
GLUCOSE-CAPILLARY: 211 mg/dL — AB (ref 70–99)
GLUCOSE-CAPILLARY: 268 mg/dL — AB (ref 70–99)
Glucose-Capillary: 190 mg/dL — ABNORMAL HIGH (ref 70–99)
Glucose-Capillary: 158 mg/dL — ABNORMAL HIGH (ref 70–99)

## 2013-06-20 LAB — OSMOLALITY, URINE: Osmolality, Ur: 520 mOsm/kg (ref 390–1090)

## 2013-06-20 MED ORDER — ALBUMIN HUMAN 25 % IV SOLN
25.0000 g | Freq: Once | INTRAVENOUS | Status: AC
  Administered 2013-06-20: 15:00:00 25 g via INTRAVENOUS

## 2013-06-20 MED ORDER — LACTULOSE 10 GM/15ML PO SOLN
10.0000 g | Freq: Two times a day (BID) | ORAL | Status: DC
  Administered 2013-06-20 – 2013-06-25 (×6): 10 g via ORAL
  Filled 2013-06-20 (×11): qty 15

## 2013-06-20 MED ORDER — ALBUMIN HUMAN 25 % IV SOLN
25.0000 g | Freq: Once | INTRAVENOUS | Status: DC
  Filled 2013-06-20: qty 100

## 2013-06-20 MED ORDER — INSULIN GLARGINE 100 UNIT/ML ~~LOC~~ SOLN
30.0000 [IU] | Freq: Every day | SUBCUTANEOUS | Status: DC
  Administered 2013-06-20 – 2013-06-25 (×5): 30 [IU] via SUBCUTANEOUS
  Filled 2013-06-20 (×8): qty 0.3

## 2013-06-20 NOTE — Procedures (Signed)
Successful US guided paracentesis from RLQ.  Yielded 6.1 liters of cloudy yellow fluid.  No immediate complications.  Pt tolerated well.   Specimen was not sent for labs.  Pattricia BossMORGAN, Amneet Cendejas D PA-C 06/20/2013 2:35 PM

## 2013-06-20 NOTE — Progress Notes (Signed)
[facility] PROGRESS NOTE   Jerry [name] FXT:[id number] DOB: 1941/02/03 DOA: 06/12/2013 PCP: Simona Huh, MD  Brief narrative: Jerry [name] is an 73 y.o. male with a PMH of cirrhosis of the liver secondary to [facility], recurrent accumulating ascites requiring frequent paracentesis, hypertension, hyperlipidemia, diabetes, stage II-3 chronic kidney disease with baseline creatinine 1.2-1.6, chronic anemia with baseline hemoglobin around 11 mg/dL who was admitted on 06/12/2013 with altered mental status. Upon initial evaluation, the patient was hyponatremic with elevated ammonia levels and evidence of pneumonia on chest x-ray.  Assessment/Plan: Principal Problem:   Acute respiratory failure secondary to community-acquired pneumonia Initial chest x-ray showed pneumonia. He was initially treated with broad-spectrum antibiotics including vancomycin and cefepime which was later transitioned to oral Levaquin. Repeat chest x-ray showed improvement. Septic workup including urinary Legionella antigen studies and strep pneumonia antigen studies were negative, respiratory virus panel negative, blood cultures negative to date, sputum culture not representative of lower respiratory tract secretions. Continue supplemental oxygen and bronchodilator therapy as needed. Active Problems:   Urinary retention Has required in and out catheterizations over the past 24 hours.  Place foley.   Diarrhea On Lactulose.  C. Diff negative.   Ascites Paracentesis done 06/16/2013, 6.2 L of cloudy yellow fluid withdrawn. Given 25 mg of albumin.  Typically gets these done weekly.  Abdomen tight, will order another paracentesis.   Acute on chronic renal failure Thought to be prerenal versus hepatorenal syndrome. Did not respond to IV fluids. Nephrology consultation performed by Dr. Jonnie Finner on 06/16/2013. Baseline creatinine 1.2-1.6. Current creatinine slightly higher than usual baseline values.   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 Seen by Dr. Lovena Le from the palliative care team on 06/17/2013. Patient remains a full code.   Hyponatremia Multifactorial with cirrhosis physiology and volume depletion contributory. 3% saline initiated on 06/16/2013 under the direction of nephrology. Sodium steadily improving from a low of 114---> 125--->127--->128.   Diabetes mellitus, type II Hemoglobin A1c 7.1% on 06/02/2013. Continue Tradjenta, Lantus 20 units daily, and resistant SSI 3 times a day. CBGs 182-321.  Increase Lantus to 30 units.   Hepatic encephalopathy Continue lactulose and Rifaximin.  More encephalopathic today with reduction of Lactulose to QD, will increase back to BID.   Protein-calorie malnutrition, severe Being followed by dietitian. Continue supplements.   Hyperkalemia Resolved with Kayexalate.   Leukocytosis, unspecified Resolved with treatment of pneumonia.  Code Status: Full. Family Communication: Wife/family updated at bedside. Disposition Plan: Home when stable.  IV access:  PICC line placed 06/16/2013  Medical Consultants:  None  Other Consultants:  Dietician  Anti-infectives: Vancomycin 12/25- 12/28  Cefepime 12/25- 12/28  levaquin 12/28. >>   HPI/Subjective: Jerry [name] reports reduction in loose stools, and stool was formed this morning.  Awake but tremulous.  Appetite remains poor.  Complains of dry mouth and abdominal bloating.  Objective: Filed Vitals:   06/19/13 1600 06/19/13 1915 06/19/13 2031 06/20/13 0500  BP: 104/64  110/64 103/72  Pulse: 102   98  Temp: 97.7 F (36.5 C) 97.9 F (36.6 C)  98.8 F (37.1 C)  TempSrc: Oral Oral  Oral  Resp: 14   14  Height:      Weight:    83.4 kg (183 lb 13.8 oz)  SpO2: 97%   95%    Intake/Output Summary (Last 24 hours) at 06/20/13 0742 Last data filed at 06/19/13 2300  Gross per 24 hour  Intake    240 ml  Output    500  ml  Net   -260 ml    Exam: Gen:  NAD, more  alert Cardiovascular:  Tachycardic, No M/R/G Respiratory:  Lungs CTAB Gastrointestinal:  Abdomen softly distended, NT, + BS Extremities:  No C/E/C  Data Reviewed: Basic Metabolic Panel:  Recent Labs Lab 06/15/13 0530  06/16/13 0926 06/16/13 1709  06/17/13 0510  06/18/13 1423 06/18/13 2040 06/18/13 2220 06/19/13 0415 06/19/13 0433  NA 114*  < > 114* 116*  < > 122*  < > 123* 147 125* 127* 128*  K 4.2  --  4.6 4.5  --  4.6  --   --   --   --  4.5  --   CL 80*  --  82* 83*  --  88*  --   --   --   --  97  --   CO2 21  --  19 19  --  19  --   --   --   --  18*  --   GLUCOSE 229*  --  218* 171*  --  142*  --   --   --   --  227*  --   BUN 57*  --  61* 61*  --  64*  --   --   --   --  62*  --   CREATININE 1.54*  --  1.76* 1.78*  --  1.75*  --   --   --   --  1.72*  --   CALCIUM 8.9  --  8.8 9.1  --  8.9  --   --   --   --  8.7  --   < > = values in this interval not displayed. GFR Estimated Creatinine Clearance: 37.7 ml/min (by C-G formula based on Cr of 1.72). Liver Function Tests:  Recent Labs Lab 06/19/13 0415  AST 100*  ALT 90*  ALKPHOS 326*  BILITOT 1.4*  PROT 6.1  ALBUMIN 2.8*    Recent Labs Lab 06/19/13 0414  AMMONIA 39    CBC:  Recent Labs Lab 06/14/13 0840 06/15/13 0530 06/17/13 0510 06/19/13 0415  WBC 13.8* 14.6* 10.1 11.7*  NEUTROABS 10.5*  --   --   --   HGB 14.6 14.5 13.6 15.5  HCT 41.1 40.8 37.2* 44.1  MCV 90.1 89.1 88.8 91.7  PLT 137* 142* 111* 119*   BNP (last 3 results)  Recent Labs  04/06/13 0518 04/07/13 0500 06/12/13 1056  PROBNP 73.5 66.0 447.5*   CBG:  Recent Labs Lab 06/18/13 2223 06/19/13 0846 06/19/13 1215 06/19/13 1608 06/19/13 2210  GLUCAP 321* 184* 275* 182* 250*   icrobiology Recent Results (from the past 240 hour(s))  CULTURE, BLOOD (ROUTINE X 2)     Status: None   Collection Time    06/12/13 10:56 AM      Result Value Range Status   Specimen Description BLOOD RIGHT HAND   Final   Special Requests      Final   Value: BOTTLES DRAWN AEROBIC AND ANAEROBIC 5CC BOTH BOTTLES   Culture  Setup Time     Final   Value: 06/12/2013 14:52     Performed at [place]     Final   Value: NO GROWTH 5 DAYS     Performed at [facility]   Report Status 06/18/2013 FINAL   Final  CULTURE, BLOOD (ROUTINE X 2)     Status: None   Collection Time    06/12/13  11:00 AM      Result Value Range Status   Specimen Description BLOOD LEFT ARM   Final   Special Requests BOTTLES DRAWN AEROBIC ONLY 3CC   Final   Culture  Setup Time     Final   Value: 06/12/2013 14:52     Performed at [facility]   Culture     Final   Value: NO GROWTH 5 DAYS     Performed at [facility]   Report Status 06/18/2013 FINAL   Final  RESPIRATORY VIRUS PANEL     Status: None   Collection Time    06/14/13  5:36 AM      Result Value Range Status   Source - RVPAN NOSE   Final   Respiratory Syncytial Virus A NOT DETECTED   Final   Respiratory Syncytial Virus B NOT DETECTED   Final   Influenza A NOT DETECTED   Final   Influenza B NOT DETECTED   Final   Parainfluenza 1 NOT DETECTED   Final   Parainfluenza 2 NOT DETECTED   Final   Parainfluenza 3 NOT DETECTED   Final   Metapneumovirus NOT DETECTED   Final   Rhinovirus NOT DETECTED   Final   Adenovirus NOT DETECTED   Final   Influenza A H1 NOT DETECTED   Final   Influenza A H3 NOT DETECTED   Final   Comment: (NOTE)           Normal Reference Range for each Analyte: NOT DETECTED     Testing performed using the Luminex xTAG Respiratory Viral Panel test     kit.     This test was developed and its performance characteristics determined     by [facility]. It has not been cleared or approved by the [place]     Food and Drug Administration. This test is used for clinical purposes.     It should not be regarded as investigational or for research. This     laboratory is certified under the [place] (CLIA) as qualified to perform high complexity     clinical laboratory testing.     Performed at [place] PCR SCREENING     Status: Abnormal   Collection Time    06/16/13  8:37 PM      Result Value Range Status   MRSA by PCR POSITIVE (*) NEGATIVE Final   Comment:            The GeneXpert MRSA Assay (FDA     approved for NASAL specimens     only), is one component of a     comprehensive MRSA colonization     surveillance program. It is not     intended to diagnose MRSA     infection nor to guide or     monitor treatment for     MRSA infections.     RESULT CALLED TO, READ BACK BY AND VERIFIED WITH:     ALDRIDGE,D/2W '@2219'  ON 06/16/13 BY KARCZEWSKI,S.  CLOSTRIDIUM DIFFICILE BY PCR     Status: None   Collection Time    06/17/13  5:52 AM      Result Value Range Status   C difficile by pcr NEGATIVE  NEGATIVE Final   Comment: Performed at [place], EXPECTORATED SPUTUM-ASSESSMENT     Status: None   Collection Time    06/17/13  4:11  PM      Result Value Range Status   Specimen Description SPU   Final   Special Requests NONE   Final   Sputum evaluation     Final   Value: MICROSCOPIC FINDINGS SUGGEST THAT THIS SPECIMEN IS NOT REPRESENTATIVE OF LOWER RESPIRATORY SECRETIONS. PLEASE RECOLLECT.     RESULTS CALLED TO Dewain Penning [id number] @ [place]   Report Status 06/17/2013 FINAL   Final     Procedures and Diagnostic Studies: Dg Chest 2 View  06/14/2013   CLINICAL DATA:  Cough.  FOLLOW UP pneumonia.  EXAM: CHEST  2 VIEW  COMPARISON:  06/12/2013  FINDINGS: Mild basilar lung opacity is noted, similar on the left and mildly increased on the right, most consistent with atelectasis. No convincing pneumonia. Lung volumes are low accentuating the lung base opacity.  No pulmonary edema.  No pleural effusion or pneumothorax.  The heart, mediastinum and hila are unremarkable.  IMPRESSION: Mild lung base opacity as described most consistent with  atelectasis.   Electronically Signed   By: Lajean Manes M.D.   On: 06/14/2013 17:37   Dg Chest 2 View  06/12/2013   CLINICAL DATA:  Cough and nausea.  EXAM: CHEST  2 VIEW  COMPARISON:  Chest radiograph performed 06/02/2013  FINDINGS: The lungs are hypoexpanded. Mild left basilar opacity may reflect atelectasis or possibly mild pneumonia. There is no evidence of pleural effusion or pneumothorax.  The heart is normal in size; the mediastinal contour is within normal limits. No acute osseous abnormalities are seen.  IMPRESSION: Lungs hypoexpanded. Mild left basilar airspace opacity may reflect atelectasis or possibly mild pneumonia.   Electronically Signed   By: Garald Balding M.D.   On: 06/12/2013 04:30   Dg Chest 2 View  06/02/2013   CLINICAL DATA:  Altered mental still  EXAM: CHEST  2 VIEW  COMPARISON:  Chest x-ray from yesterday  FINDINGS: Low lung volumes persist, with bibasilar atelectasis. No edema, pneumothorax, or consolidation. Borderline cardiomegaly.  IMPRESSION: Persistently low lung volumes, with basilar atelectasis.   Electronically Signed   By: Jorje Guild M.D.   On: 06/02/2013 03:55   Dg Chest 2 View  06/01/2013   CLINICAL DATA:  Cough, dyspnea  EXAM: CHEST  2 VIEW  COMPARISON:  04/06/2013.  FINDINGS: Low lung volumes. Areas of increased density project within the lung bases. The osseous structures are unremarkable. The cardiac silhouette is obscured.  IMPRESSION: Low lung volumes. Atelectasis versus infiltrate within the lung bases. Repeat evaluation with deeper inspiration recommended.   Electronically Signed   By: Margaree Mackintosh M.D.   On: 06/01/2013 12:44   Ct Head Wo Contrast  06/02/2013   CLINICAL DATA:  Expressive aphasia  EXAM: CT HEAD WITHOUT CONTRAST  TECHNIQUE: Contiguous axial images were obtained from the base of the skull through the vertex without intravenous contrast.  COMPARISON:  10/02/2010  FINDINGS: Skull and Sinuses:No significant abnormality.  Orbits: No acute  abnormality.  Brain: No evidence of acute abnormality, such as acute infarction, hemorrhage, hydrocephalus, or mass lesion/mass effect. Age appropriate cerebral volume loss.  IMPRESSION: No evidence of acute intracranial disease.   Electronically Signed   By: Jorje Guild M.D.   On: 06/02/2013 04:07   US Paracentesis  06/16/2013   CLINICAL DATA:  Cirrhosis, recurrent ascites, request for therapeutic paracentesis.  EXAM: ULTRASOUND GUIDED PARACENTESIS  TECHNIQUE: Survey ultrasound of the abdomen was performed and an appropriate skin entry site in the RLQ abdomen was selected. Skin  site was marked, prepped with Betadine, and draped in usual sterile fashion, and infiltrated locally with 1% lidocaine. A 5 French multi-sidehole Yueh sheath needle was advanced into the peritoneal space until fluid could be aspirated. The sheath was advanced and the needle removed. 6.2 liters of cloudy yellow ascites were aspirated. No immediate complication.  IMPRESSION: Technically successful ultrasound guided paracentesis, removing 6.2 liters of ascites.  Read By:  Tsosie Billing PA-C   Electronically Signed   By: Markus Daft M.D.   On: 06/16/2013 15:11   US Paracentesis  06/10/2013   CLINICAL DATA:  Cirrhosis, recurrent ascites. Request is made for therapeutic paracentesis.  EXAM: ULTRASOUND GUIDED THERAPEUTIC PARACENTESIS  COMPARISON:  PREVIOUS PARACENTESIS ON 05/22/2013.  PROCEDURE: An ultrasound guided paracentesis was thoroughly discussed with the patient and questions answered. The benefits, risks, alternatives and complications were also discussed. The patient understands and wishes to proceed with the procedure. Written consent was obtained.  Ultrasound was performed to localize and mark an adequate pocket of fluid in the left mid to lower quadrant of the abdomen. The area was then prepped and draped in the normal sterile fashion. 1% Lidocaine was used for local anesthesia. Under ultrasound guidance a 19 gauge Yueh  catheter was introduced. Paracentesis was performed. The catheter was removed and a dressing applied.  Complications: None.  FINDINGS: A total of approximately 2.9 liters of turbid, yellow fluid was removed.  IMPRESSION: Successful ultrasound guided therapeutic paracentesis yielding 2.9 liters of ascites.  Read by: Rowe Robert ,P.A.-C.   Electronically Signed   By: Sandi Mariscal M.D.   On: 06/10/2013 13:02   US Paracentesis  06/05/2013   CLINICAL DATA:  Cirrhosis, recurrent ascites  EXAM: ULTRASOUND GUIDED PARACENTESIS  TECHNIQUE: Survey ultrasound of the abdomen was performed and an appropriate skin entry site in the RUQ abdomen was selected. Skin site was marked, prepped with Betadine, and draped in usual sterile fashion, and infiltrated locally with 1% lidocaine. A 5 French multisidehole Yueh sheath needle was advanced into the peritoneal space until fluid could be aspirated. The sheath was advanced and the needle removed. 4.5 liters of milky yellow colored ascites were aspirated. No immediate complication.  IMPRESSION: Technically successful ultrasound guided paracentesis, removing 4.5 liters of ascites.  Read By:  Tsosie Billing PA-C   Electronically Signed   By: Maryclare Bean M.D.   On: 06/05/2013 16:08   US Paracentesis  05/22/2013   CLINICAL DATA:  Cirrhosis, recurrent ascites  EXAM: ULTRASOUND GUIDED PARACENTESIS  TECHNIQUE: Survey ultrasound of the abdomen was performed and an appropriate skin entry site in the RLQ abdomen was selected. Skin site was marked, prepped with Betadine, and draped in usual sterile fashion, and infiltrated locally with 1% lidocaine. A 5 French multi-sidehole Yueh sheath needle was advanced into the peritoneal space until fluid could be aspirated. The sheath was advanced and the needle removed. 9.5 liters of milky yellow/light brown colorascites were aspirated. No immediate complication.  IMPRESSION: Technically successful ultrasound guided paracentesis, removing 9.5 liters of  ascites.  Read By:  Tsosie Billing PA-C   Electronically Signed   By: Markus Daft M.D.   On: 05/22/2013 11:54    Scheduled Meds: . Chlorhexidine Gluconate Cloth  6 each Topical Q0600  . enoxaparin (LOVENOX) injection  40 mg Subcutaneous Q24H  . feeding supplement (GLUCERNA SHAKE)  237 mL Oral BID BM  . insulin aspart  0-20 Units Subcutaneous TID WC  . insulin aspart  0-5 Units Subcutaneous QHS  . insulin glargine  20 Units Subcutaneous Daily  . lactose free nutrition  237 mL Oral Daily  . lactulose  10 g Oral Daily  . linagliptin  5 mg Oral Daily  . mupirocin ointment  1 application Nasal BID  . pantoprazole  40 mg Oral BID AC  . rifaximin  550 mg Oral BID  . sodium chloride  10-40 mL Intracatheter Q12H  . sodium chloride  2 g Oral BID WC   Continuous Infusions:    Time spent: 25 minutes.    LOS: 8 days   [facility] Pager [phone].   *Please note that the hospitalists switch teams on Wednesdays. Please call the flow manager at [phone] if you are having difficulty reaching the hospitalist taking care of this patient as she can update you and provide the most up-to-date pager number of provider caring for the patient. If 8PM-8AM, please contact night-coverage at [email or web address], password [facility]  06/20/2013, 7:42 AM

## 2013-06-20 NOTE — Progress Notes (Signed)
Physical Therapy Treatment Patient Details Name: Jerry BeadyFranklin [name] MRN: [id number] DOB: 06/20/1940 Today's Date: 06/20/2013 Time: [id number] PT Time Calculation (min): 10 min  PT Assessment / Plan / Recommendation  History of Present Illness 73 y.o. male with a PMH of [facility], recurrent accumulating ascites requiring frequent paracentesis, hypertension, hyperlipidemia, diabetes, stage II-3 chronic kidney disease with baseline creatinine 1.2-1.6, chronic anemia with baseline hemoglobin around 11 mg/dL who was admitted on 95/62/130812/25/2014 with altered mental status. Upon initial evaluation, the patient was hyponatremic with elevated ammonia levels and evidence of pneumonia on chest x-ray.   PT Comments   Improved activity tolerance and ability to self assist vs this am.  Follow Up Recommendations  Other (comment);SNF     Does the patient have the potential to tolerate intense rehabilitation     Barriers to Discharge        Equipment Recommendations  Other (comment)    Recommendations for Other Services    Frequency Min 2X/week   Progress towards PT Goals    Plan Current plan remains appropriate    Precautions / Restrictions Precautions Precautions: Fall Restrictions Weight Bearing Restrictions: No   Pertinent Vitals/Pain     Mobility  Bed Mobility Bed Mobility: Sit to Supine Supine to Sit: HOB elevated;1: +2 Total assist Supine to Sit: Patient Percentage: 30% Sit to Supine: 3: Mod assist Details for Bed Mobility Assistance: cues and assist for log roll technique into bed and for bridging to adjust to center of bed Transfers Transfers: Sit to Stand;Stand to Sit Sit to Stand: 1: +2 Total assist;From chair/3-in-1;With upper extremity assist Sit to Stand: Patient Percentage: 60% Stand to Sit: 1: +2 Total assist;With upper extremity assist;To bed Stand to Sit: Patient Percentage: 60% Stand Pivot Transfers: 1: +2 Total assist Stand Pivot Transfers:  Patient Percentage: 40% Details for Transfer Assistance: cues for transition position and assist to bring wt up and fwd and to initially block knees to attain standing.   Ambulation/Gait Ambulation/Gait Assistance: 1: +2 Total assist Ambulation/Gait: Patient Percentage: 70% Ambulation Distance (Feet): 3 Feet Assistive device: Rolling walker Ambulation/Gait Assistance Details: cues for sequence, posture and position from RW  Gait Pattern: Step-to pattern;Decreased step length - right;Decreased step length - left;Shuffle Stairs: No    Exercises     PT Diagnosis: Generalized weakness  PT Problem List: Decreased strength;Decreased activity tolerance;Decreased mobility PT Treatment Interventions: DME instruction;Gait training;Functional mobility training;Therapeutic activities;Therapeutic exercise;Patient/family education;Wheelchair mobility training   PT Goals (current goals can now be found in the care plan section) Acute Rehab PT Goals PT Goal Formulation: With patient Time For Goal Achievement: 06/20/13 Potential to Achieve Goals: Fair  Visit Information  Last PT Received On: 06/20/13 Assistance Needed: +2 History of Present Illness: 73 y.o. male with a PM[facility] of [facility], recurrent accumulating ascites requiring frequent paracentesis, hypertension, hyperlipidemia, diabetes, stage II-3 chronic kidney disease with baseline creatinine 1.2-1.6, chronic anemia with baseline hemoglobin around 11 mg/dL who was admitted on 65/78/469612/25/2014 with altered mental status. Upon initial evaluation, the patient was hyponatremic with elevated ammonia levels and evidence of pneumonia on chest x-ray.    Subjective Data      Cognition  Cognition Arousal/Alertness: Awake/alert Behavior During Therapy: Flat affect Overall Cognitive Status: Within Functional Limits for tasks assessed    Balance  Balance Balance Assessed: Yes Static Sitting Balance Static Sitting - Balance  Support: Bilateral upper extremity supported Static Sitting - Level of Assistance: 5: Stand by assistance  End of Session PT - End of Session Equipment Utilized During Treatment: Gait belt Activity Tolerance: Patient limited by fatigue Patient left: in bed;with call bell/phone within reach;with family/visitor present Nurse Communication: Mobility status   GP     Marleni Gallardo 06/20/2013, 3:07 PM

## 2013-06-20 NOTE — Progress Notes (Signed)
[facility], no availability today or through the weekend at this time.  Chalmers Caterose Madeeha Costantino RN Liaison [facility] [phone]

## 2013-06-20 NOTE — Progress Notes (Signed)
Room [facility] 1618 - Wyline BeadyFranklin Kawamoto - [facility]-R.Olanda Downie RN  [facility].   Pt is FULL code.    Pt alert, appears oriented, sitting upright in lounge chair with 2-person assist with PT. Pt has no complaints of pain.  Pt beginning to have discomfort throughout abdomen with apparent reaccumulation of ascites - family questioning if paracentesis is due now.  Wife and son present. Patient's home medication list is on shadow chart.   Extended conversation in hallway with wife and son.  They do not feel they can handle pt at home any longer. Pt takes pills with encouragement only, PO intake consists of bites of ice cream, Glucerna sips.  Family questioning if pt will be eligible for BP - will contact [facility] SW and send info to BP for consideration.   Please call [facility] @ [phone]- ask for RN Liaison or after hours,ask for on-call RN with any hospice needs.   Thank you.  Joneen Boersose B Claira Jeter, RN  [facility]  Hospice Liaison  [phone])

## 2013-06-20 NOTE — Evaluation (Addendum)
Physical Therapy Evaluation Patient Details Name: Jerry [name] MRN: [id number] DOB: 06/08/1941 Today's Date: 06/20/2013 Time: [id number] PT Time Calculation (min): 12 min  PT Assessment / Plan / Recommendation History of Present Illness  73 y.o. 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 Upon initial evaluation, the patient was hyponatremic with elevated ammonia levels and evidence of pneumonia on chest x-ray.  Clinical Impression  Pt admitted with acute respiratory failure and hyponatremia. Pt currently with functional limitations due to the deficits listed below (see PT Problem List).  Pt will benefit from skilled PT to increase their independence and safety with mobility to allow and decrease burden of care.  Pt requiring +2 assist to transfer at this time and family feels unable to provide this assist at home.  Spoke with spouse and hospice RN outside who report pt on home hospice and eligible for SNF if home hospice revoked.  [facility] however if d/c home, recommend HHPT to assess home environment and provide family education for safe mobility.  Pt would also benefit from SNF for rehab if pt and family agreeable.     PT Assessment  Patient needs continued PT services    Follow Up Recommendations  Other (comment);SNF (would benefit from SNF if home hospice revoked, [facility])    Does the patient have the potential to tolerate intense rehabilitation      Barriers to Discharge        Equipment Recommendations  Other (comment) (hoyer lift)    Recommendations for Other Services     Frequency Min 2X/week    Precautions / Restrictions  Precautions Precautions: Fall   Pertinent Vitals/Pain Pt only c/o not feeling well, repositioned      Mobility  Bed Mobility Bed Mobility: Supine to Sit Supine to Sit: HOB elevated;1: +2 Total assist Supine to Sit: Patient Percentage: 30% Details for Bed Mobility Assistance: verbal cues for technique and assisting as much as possible Transfers Transfers: Sit to Stand;Stand to Sit;Stand Pivot Transfers Sit to Stand: 1: +2 Total assist;From bed Sit to Stand: Patient Percentage: 30% Stand to Sit: 1: +2 Total assist;With upper extremity assist;To chair/3-in-1 Stand to Sit: Patient Percentage: 50% Stand Pivot Transfers: 1: +2 Total assist Stand Pivot Transfers: Patient Percentage: 40% Details for Transfer Assistance: provided 2 HHA and knees blocked due to weakness, pt with buckling initially upon standing however improved so able to transfer to recliner Ambulation/Gait Ambulation/Gait Assistance: Not tested (comment)    Exercises     PT Diagnosis: Generalized weakness  PT Problem List: Decreased strength;Decreased activity tolerance;Decreased mobility PT Treatment Interventions: DME instruction;Gait training;Functional mobility training;Therapeutic activities;Therapeutic exercise;Patient/family education;Wheelchair mobility training     PT Goals(Current goals can be found in the care plan section) Acute Rehab PT Goals PT Goal Formulation: With patient Time For Goal Achievement: 06/20/13 Potential to Achieve Goals: Fair  Visit Information  Last PT Received On: 06/20/13 Assistance Needed: +2 History of Present Illness: 73 y.o. 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 Upon initial  evaluation, the patient was hyponatremic with elevated ammonia levels and evidence of pneumonia on chest x-ray.       Prior Functioning  Home Living Family/patient expects to be discharged to:: Private residence Living Arrangements: Spouse/significant other Available Help at Discharge: Family;Available 24 hours/day Type of Home: House Home Access: Ramped entrance Home Layout: Two level;Able to live on main level with bedroom/bathroom Home Equipment: Hospital bed;Wheelchair - manual Additional Comments: Family feels with his functional mobility decline they can no longer care for him at home due to increased assist Prior Function Level of Independence: Needs assistance Gait / Transfers Assistance Needed: family states at his best, he was able to stand and take small steps to w/c with approx min assist Comments: within last week as required increased assist to stand and transfer Communication Communication: No difficulties    Cognition  Cognition Arousal/Alertness: Awake/alert Behavior During Therapy: Flat affect Overall Cognitive Status: Within Functional Limits for tasks assessed    Extremity/Trunk Assessment Lower Extremity Assessment Lower Extremity Assessment: Generalized weakness;LLE deficits/detail LLE Deficits / Details: drop foot from previous back sx per spouse   Balance Balance Balance Assessed: Yes Static Sitting Balance Static Sitting - Balance Support: Bilateral upper extremity supported Static Sitting - Level of Assistance: 5: Stand by assistance  End of Session PT - End of Session Activity Tolerance: Patient limited by fatigue Patient left: in chair;with call bell/phone within reach Nurse Communication: Mobility status  GP     Kesean Serviss,KATHrine E 06/20/2013, 12:39 PM Zenovia Jarred, PT, DPT 06/20/2013 Pager: [phone]

## 2013-06-20 NOTE — Progress Notes (Signed)
Bladder scanned pt , noted 363 cc of urine in the bladder. Family is requesting for foley catheter instead of in and out catheter every six hours as needed. Will keep monitoring.

## 2013-06-21 LAB — BASIC METABOLIC PANEL
BUN: 76 mg/dL — ABNORMAL HIGH (ref 6–23)
CALCIUM: 8.9 mg/dL (ref 8.4–10.5)
CO2: 16 meq/L — AB (ref 19–32)
CREATININE: 2.25 mg/dL — AB (ref 0.50–1.35)
Chloride: 101 mEq/L (ref 96–112)
GFR calc Af Amer: 31 mL/min — ABNORMAL LOW (ref >90)
GFR calc non Af Amer: 27 mL/min — ABNORMAL LOW (ref >90)
Glucose, Bld: 157 mg/dL — ABNORMAL HIGH (ref 70–99)
Potassium: 4.6 mEq/L (ref 3.7–5.3)
Sodium: 131 mEq/L — ABNORMAL LOW (ref 137–147)

## 2013-06-21 LAB — AMMONIA: AMMONIA: 40 umol/L (ref 11–60)

## 2013-06-21 LAB — GLUCOSE, CAPILLARY
GLUCOSE-CAPILLARY: 218 mg/dL — AB (ref 70–99)
Glucose-Capillary: 147 mg/dL — ABNORMAL HIGH (ref 70–99)
Glucose-Capillary: 255 mg/dL — ABNORMAL HIGH (ref 70–99)
Glucose-Capillary: 133 mg/dL — ABNORMAL HIGH (ref 70–99)

## 2013-06-21 MED ORDER — LORAZEPAM 0.5 MG PO TABS: 0.5000 mg | ORAL_TABLET | Freq: Four times a day (QID) | ORAL | Status: DC | PRN

## 2013-06-21 NOTE — Progress Notes (Signed)
[facility] PROGRESS NOTE   Jerry [name] ZJQ:[id number] DOB: 10/23/1940 DOA: 06/12/2013 PCP: Simona Huh, MD  Brief narrative: Jerry [name] is an 73 y.o. 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 Upon initial evaluation, the patient was hyponatremic with elevated ammonia levels and evidence of pneumonia on chest x-ray.  Assessment/Plan: Principal Problem:   Acute respiratory failure secondary to community-acquired pneumonia Initial chest x-ray showed pneumonia. He was initially treated with broad-spectrum antibiotics including vancomycin and cefepime which was later transitioned to oral Levaquin. Repeat chest x-ray showed improvement. Septic workup including urinary Legionella antigen studies and strep pneumonia antigen studies were negative, respiratory virus panel negative, blood cultures negative to date, sputum culture not representative of lower respiratory tract secretions. Continue supplemental oxygen and bronchodilator therapy as needed. Active Problems:   Urinary retention Continue foley.   Diarrhea On Lactulose.  C. Diff negative.   Ascites Paracentesis done 06/16/2013 with 6.2 L withdrawn and 06/20/13 with 6.1 L withdrawn. Given 25 mg of albumin each time.  Typically gets these done weekly.     Acute on chronic renal failure Thought to be prerenal versus hepatorenal syndrome. Did not respond to IV fluids. Nephrology consultation performed by Dr. Jonnie Finner on 06/16/2013. Baseline creatinine 1.2-1.6. Current creatinine slightly higher than usual baseline values.   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 Seen by Dr. Lovena Le from the  palliative care team on 06/17/2013. Patient remains a full code.   Hyponatremia Multifactorial with cirrhosis physiology and volume depletion contributory. 3% saline initiated on 06/16/2013 under the direction of nephrology. Sodium steadily improving from a low of 114---> 125--->127--->128--->131.   Diabetes mellitus, type II Hemoglobin A1c 7.1% on 06/02/2013. Continue Tradjenta, Lantus 30 units daily, and resistant SSI 3 times a day. CBGs [id number].    Hepatic encephalopathy Continue lactulose and Rifaximin.    Protein-calorie malnutrition, severe Being followed by dietitian. Continue supplements.   Hyperkalemia Resolved with Kayexalate.   Leukocytosis, unspecified Resolved with treatment of pneumonia.  Code Status: Full. Family Communication: Wife updated at bedside. Disposition Plan: Home when stable.  IV access:  PICC line placed 06/16/2013  Medical Consultants:  None  Other Consultants:  Dietician  Anti-infectives: Vancomycin 12/25- 12/28  Cefepime 12/25- 12/28  levaquin 12/28. >>   HPI/Subjective: Jerry [name] has been irritable.  Denies dyspnea, nausea, vomiting, pain.  Thinks he moved his bowels 2-3 times so far today.  States "I don't feel good" but cannot specify what his symptoms are.  Objective: Filed Vitals:   06/20/13 1425 06/20/13 1546 06/20/13 2100 06/21/13 0643  BP: 99/60 98/55  88/49  Pulse:  65 87 96  Temp:  98.7 F (37.1 C) 98.9 F (37.2 C) 98.2 F (36.8 C)  TempSrc:  Oral Oral Oral  Resp:  _0 Height:      Weight:      SpO2:  97% 97% 97%    Intake/Output Summary (Last 24 hours) at 06/21/13 1456 Last data filed at 06/21/13 1000  Gross per 24 hour  Intake    180 ml  Output    375 ml  Net   -195 ml    Exam: Gen:  NAD, more alert Cardiovascular:  Tachycardic, No M/R/G Respiratory:  Lungs CTAB Gastrointestinal:  Abdomen softly distended,  NT, + BS Extremities:  No C/E/C  Data Reviewed: Basic Metabolic Panel:  Recent  Labs Lab 06/16/13 0926 06/16/13 1709  06/17/13 0510  06/18/13 2040 06/18/13 2220 06/19/13 0415 06/19/13 0433 06/21/13 0610  NA 114* 116*  < > 122*  < > 147 125* 127* 128* 131*  K 4.6 4.5  --  4.6  --   --   --  4.5  --  4.6  CL 82* 83*  --  88*  --   --   --  97  --  101  CO2 19 19  --  19  --   --   --  18*  --  16*  GLUCOSE 218* 171*  --  142*  --   --   --  227*  --  157*  BUN 61* 61*  --  64*  --   --   --  62*  --  76*  CREATININE 1.76* 1.78*  --  1.75*  --   --   --  1.72*  --  2.25*  CALCIUM 8.8 9.1  --  8.9  --   --   --  8.7  --  8.9  < > = values in this interval not displayed. GFR Estimated Creatinine Clearance: 28.8 ml/min (by C-G formula based on Cr of 2.25). Liver Function Tests:  Recent Labs Lab 06/19/13 0415  AST 100*  ALT 90*  ALKPHOS 326*  BILITOT 1.4*  PROT 6.1  ALBUMIN 2.8*    Recent Labs Lab 06/19/13 0414 06/21/13 0610  AMMONIA 39 40    CBC:  Recent Labs Lab 06/15/13 0530 06/17/13 0510 06/19/13 0415  WBC 14.6* 10.1 11.7*  HGB 14.5 13.6 15.5  HCT 40.8 37.2* 44.1  MCV 89.1 88.8 91.7  PLT 142* 111* 119*   BNP (last 3 results)  Recent Labs  04/06/13 0518 04/07/13 0500 06/12/13 1056  PROBNP 73.5 66.0 447.5*   CBG:  Recent Labs Lab 06/20/13 1148 06/20/13 1715 06/20/13 2141 06/21/13 0734 06/21/13 1255  GLUCAP 211* 268* 158* 147* 255*   icrobiology Recent Results (from the past 240 hour(s))  CULTURE, BLOOD (ROUTINE X 2)     Status: None   Collection Time    06/12/13 10:56 AM      Result Value Range Status   Specimen Description BLOOD RIGHT HAND   Final   Special Requests     Final   Value: BOTTLES DRAWN AEROBIC AND ANAEROBIC 5CC BOTH BOTTLES   Culture  Setup Time     Final   Value: 06/12/2013 14:52     Performed at [place]     Final   Value: NO GROWTH 5 DAYS     Performed at [facility]   Report Status 06/18/2013 FINAL   Final  CULTURE, BLOOD (ROUTINE X 2)     Status: None    Collection Time    06/12/13 11:00 AM      Result Value Range Status   Specimen Description BLOOD LEFT ARM   Final   Special Requests BOTTLES DRAWN AEROBIC ONLY 3CC   Final   Culture  Setup Time     Final   Value: 06/12/2013 14:52     Performed at [facility]   Culture     Final   Value: NO GROWTH 5 DAYS     Performed at [facility]   Report Status 06/18/2013 FINAL   Final  RESPIRATORY VIRUS PANEL  Status: None   Collection Time    06/14/13  5:36 AM      Result Value Range Status   Source - RVPAN NOSE   Final   Respiratory Syncytial Virus A NOT DETECTED   Final   Respiratory Syncytial Virus B NOT DETECTED   Final   Influenza A NOT DETECTED   Final   Influenza B NOT DETECTED   Final   Parainfluenza 1 NOT DETECTED   Final   Parainfluenza 2 NOT DETECTED   Final   Parainfluenza 3 NOT DETECTED   Final   Metapneumovirus NOT DETECTED   Final   Rhinovirus NOT DETECTED   Final   Adenovirus NOT DETECTED   Final   Influenza A H1 NOT DETECTED   Final   Influenza A H3 NOT DETECTED   Final   Comment: (NOTE)           Normal Reference Range for each Analyte: NOT DETECTED     Testing performed using the Luminex xTAG Respiratory Viral Panel test     kit.     This test was developed and its performance characteristics determined     by [facility]. It has not been cleared or approved by the [place]     Food and Drug Administration. This test is used for clinical purposes.     It should not be regarded as investigational or for research. This     laboratory is certified under the [place] (CLIA) as qualified to perform high complexity     clinical laboratory testing.     Performed at [place] PCR SCREENING     Status: Abnormal   Collection Time    06/16/13  8:37 PM      Result Value Range Status   MRSA by PCR POSITIVE (*) NEGATIVE Final   Comment:            The GeneXpert MRSA Assay (FDA     approved  for NASAL specimens     only), is one component of a     comprehensive MRSA colonization     surveillance program. It is not     intended to diagnose MRSA     infection nor to guide or     monitor treatment for     MRSA infections.     RESULT CALLED TO, READ BACK BY AND VERIFIED WITH:     ALDRIDGE,D/2W _0  ON 06/16/13 BY KARCZEWSKI,S.  CLOSTRIDIUM DIFFICILE BY PCR     Status: None   Collection Time    06/17/13  5:52 AM      Result Value Range Status   C difficile by pcr NEGATIVE  NEGATIVE Final   Comment: Performed at [place], EXPECTORATED SPUTUM-ASSESSMENT     Status: None   Collection Time    06/17/13  4:11 PM      Result Value Range Status   Specimen Description SPU   Final   Special Requests NONE   Final   Sputum evaluation     Final   Value: MICROSCOPIC FINDINGS SUGGEST THAT THIS SPECIMEN IS NOT REPRESENTATIVE OF LOWER RESPIRATORY SECRETIONS. PLEASE RECOLLECT.     RESULTS CALLED TO Dewain Penning [id number] @ [place]   Report Status 06/17/2013 FINAL   Final     Procedures and Diagnostic Studies: Dg Chest 2 View  06/14/2013   CLINICAL DATA:  Cough.  FOLLOW  UP pneumonia.  EXAM: CHEST  2 VIEW  COMPARISON:  06/12/2013  FINDINGS: Mild basilar lung opacity is noted, similar on the left and mildly increased on the right, most consistent with atelectasis. No convincing pneumonia. Lung volumes are low accentuating the lung base opacity.  No pulmonary edema.  No pleural effusion or pneumothorax.  The heart, mediastinum and hila are unremarkable.  IMPRESSION: Mild lung base opacity as described most consistent with atelectasis.   Electronically Signed   By: David  Ormond M.D.   On: 06/14/2013 17:37   Dg Chest 2 View  06/12/2013   CLINICAL DATA:  Cough and nausea.  EXAM: CHEST  2 VIEW  COMPARISON:  Chest radiograph performed 06/02/2013  FINDINGS: The lungs are hypoexpanded. Mild left basilar opacity may reflect atelectasis or possibly mild pneumonia. There is no  evidence of pleural effusion or pneumothorax.  The heart is normal in size; the mediastinal contour is within normal limits. No acute osseous abnormalities are seen.  IMPRESSION: Lungs hypoexpanded. Mild left basilar airspace opacity may reflect atelectasis or possibly mild pneumonia.   Electronically Signed   By: Jeffery  Chang M.D.   On: 06/12/2013 04:30   Dg Chest 2 View  06/02/2013   CLINICAL DATA:  Altered mental still  EXAM: CHEST  2 VIEW  COMPARISON:  Chest x-ray from yesterday  FINDINGS: Low lung volumes persist, with bibasilar atelectasis. No edema, pneumothorax, or consolidation. Borderline cardiomegaly.  IMPRESSION: Persistently low lung volumes, with basilar atelectasis.   Electronically Signed   By: Jonathan  Watts M.D.   On: 06/02/2013 03:55   Dg Chest 2 View  06/01/2013   CLINICAL DATA:  Cough, dyspnea  EXAM: CHEST  2 VIEW  COMPARISON:  04/06/2013.  FINDINGS: Low lung volumes. Areas of increased density project within the lung bases. The osseous structures are unremarkable. The cardiac silhouette is obscured.  IMPRESSION: Low lung volumes. Atelectasis versus infiltrate within the lung bases. Repeat evaluation with deeper inspiration recommended.   Electronically Signed   By: Hector  Cooper M.D.   On: 06/01/2013 12:44   Ct Head Wo Contrast  06/02/2013   CLINICAL DATA:  Expressive aphasia  EXAM: CT HEAD WITHOUT CONTRAST  TECHNIQUE: Contiguous axial images were obtained from the base of the skull through the vertex without intravenous contrast.  COMPARISON:  10/02/2010  FINDINGS: Skull and Sinuses:No significant abnormality.  Orbits: No acute abnormality.  Brain: No evidence of acute abnormality, such as acute infarction, hemorrhage, hydrocephalus, or mass lesion/mass effect. Age appropriate cerebral volume loss.  IMPRESSION: No evidence of acute intracranial disease.   Electronically Signed   By: Jonathan  Watts M.D.   On: 06/02/2013 04:07   Us Paracentesis  06/16/2013   CLINICAL DATA:   Cirrhosis, recurrent ascites, request for therapeutic paracentesis.  EXAM: ULTRASOUND GUIDED PARACENTESIS  TECHNIQUE: Survey ultrasound of the abdomen was performed and an appropriate skin entry site in the RLQ abdomen was selected. Skin site was marked, prepped with Betadine, and draped in usual sterile fashion, and infiltrated locally with 1% lidocaine. A 5 French multi-sidehole Yueh sheath needle was advanced into the peritoneal space until fluid could be aspirated. The sheath was advanced and the needle removed. 6.2 liters of cloudy yellow ascites were aspirated. No immediate complication.  IMPRESSION: Technically successful ultrasound guided paracentesis, removing 6.2 liters of ascites.  Read By:  Koreen Morgan PA-C   Electronically Signed   By: Adam  Henn M.D.   On: 06/16/2013 15:11   Us Paracentesis  06/10/2013   CLINICAL   DATA:  Cirrhosis, recurrent ascites. Request is made for therapeutic paracentesis.  EXAM: ULTRASOUND GUIDED THERAPEUTIC PARACENTESIS  COMPARISON:  PREVIOUS PARACENTESIS ON 05/22/2013.  PROCEDURE: An ultrasound guided paracentesis was thoroughly discussed with the patient and questions answered. The benefits, risks, alternatives and complications were also discussed. The patient understands and wishes to proceed with the procedure. Written consent was obtained.  Ultrasound was performed to localize and mark an adequate pocket of fluid in the left mid to lower quadrant of the abdomen. The area was then prepped and draped in the normal sterile fashion. 1% Lidocaine was used for local anesthesia. Under ultrasound guidance a 19 gauge Yueh catheter was introduced. Paracentesis was performed. The catheter was removed and a dressing applied.  Complications: None.  FINDINGS: A total of approximately 2.9 liters of turbid, yellow fluid was removed.  IMPRESSION: Successful ultrasound guided therapeutic paracentesis yielding 2.9 liters of ascites.  Read by: Kevin Allred ,P.A.-C.   Electronically  Signed   By: John  Watts M.D.   On: 06/10/2013 13:02   Us Paracentesis  06/05/2013   CLINICAL DATA:  Cirrhosis, recurrent ascites  EXAM: ULTRASOUND GUIDED PARACENTESIS  TECHNIQUE: Survey ultrasound of the abdomen was performed and an appropriate skin entry site in the RUQ abdomen was selected. Skin site was marked, prepped with Betadine, and draped in usual sterile fashion, and infiltrated locally with 1% lidocaine. A 5 French multisidehole Yueh sheath needle was advanced into the peritoneal space until fluid could be aspirated. The sheath was advanced and the needle removed. 4.5 liters of milky yellow colored ascites were aspirated. No immediate complication.  IMPRESSION: Technically successful ultrasound guided paracentesis, removing 4.5 liters of ascites.  Read By:  Koreen Morgan PA-C   Electronically Signed   By: Art  Hoss M.D.   On: 06/05/2013 16:08   Us Paracentesis  05/22/2013   CLINICAL DATA:  Cirrhosis, recurrent ascites  EXAM: ULTRASOUND GUIDED PARACENTESIS  TECHNIQUE: Survey ultrasound of the abdomen was performed and an appropriate skin entry site in the RLQ abdomen was selected. Skin site was marked, prepped with Betadine, and draped in usual sterile fashion, and infiltrated locally with 1% lidocaine. A 5 French multi-sidehole Yueh sheath needle was advanced into the peritoneal space until fluid could be aspirated. The sheath was advanced and the needle removed. 9.5 liters of milky yellow/light brown colorascites were aspirated. No immediate complication.  IMPRESSION: Technically successful ultrasound guided paracentesis, removing 9.5 liters of ascites.  Read By:  Koreen Morgan PA-C   Electronically Signed   By: Adam  Henn M.D.   On: 05/22/2013 11:54    Scheduled Meds: . Chlorhexidine Gluconate Cloth  6 each Topical Q0600  . enoxaparin (LOVENOX) injection  40 mg Subcutaneous Q24H  . feeding supplement (GLUCERNA SHAKE)  237 mL Oral BID BM  . insulin aspart  0-20 Units Subcutaneous TID WC   . insulin aspart  0-5 Units Subcutaneous QHS  . insulin glargine  30 Units Subcutaneous Daily  . lactose free nutrition  237 mL Oral Daily  . lactulose  10 g Oral BID  . linagliptin  5 mg Oral Daily  . mupirocin ointment  1 application Nasal BID  . pantoprazole  40 mg Oral BID AC  . rifaximin  550 mg Oral BID  . sodium chloride  10-40 mL Intracatheter Q12H  . sodium chloride  2 g Oral BID WC   Continuous Infusions:    Time spent: 25 minutes.    LOS: 9 days   RAMA,CHRISTINA  [facility]   Pager [phone].   *Please note that the hospitalists switch teams on Wednesdays. Please call the flow manager at [phone] if you are having difficulty reaching the hospitalist taking care of this patient as she can update you and provide the most up-to-date pager number of provider caring for the patient. If 8PM-8AM, please contact night-coverage at [email or web address], password [facility]  06/21/2013, 2:56 PM

## 2013-06-21 NOTE — Progress Notes (Signed)
Room WL 1618 - Jerry [name] - [facility]-Jerry Earl Galasborne RN  [facility]. Pt is FULL code. Patient is alert and seems agitated. Family is at bedside and said that he has had a bad day. They dont know what is wrong but he is unhappy about something. Nurse notified that patient was agitated. Patient has no concerns at this time.  Please call [facility] @ [phone]- ask for RN Liaison or after hours,ask for on-call RN with any hospice needs.  Thank you. Jerry RiasErin Osborne, RN

## 2013-06-22 DIAGNOSIS — E43 Unspecified severe protein-calorie malnutrition: Secondary | ICD-10-CM

## 2013-06-22 LAB — BASIC METABOLIC PANEL
BUN: 86 mg/dL — ABNORMAL HIGH (ref 6–23)
CO2: 15 meq/L — AB (ref 19–32)
CREATININE: 2.57 mg/dL — AB (ref 0.50–1.35)
Calcium: 8.9 mg/dL (ref 8.4–10.5)
Chloride: 98 mEq/L (ref 96–112)
GFR calc Af Amer: 27 mL/min — ABNORMAL LOW (ref >90)
GFR calc non Af Amer: 23 mL/min — ABNORMAL LOW (ref >90)
Glucose, Bld: 172 mg/dL — ABNORMAL HIGH (ref 70–99)
Potassium: 4.9 mEq/L (ref 3.7–5.3)
SODIUM: 129 meq/L — AB (ref 137–147)

## 2013-06-22 LAB — GLUCOSE, CAPILLARY
GLUCOSE-CAPILLARY: 185 mg/dL — AB (ref 70–99)
Glucose-Capillary: 137 mg/dL — ABNORMAL HIGH (ref 70–99)
Glucose-Capillary: 156 mg/dL — ABNORMAL HIGH (ref 70–99)
Glucose-Capillary: 181 mg/dL — ABNORMAL HIGH (ref 70–99)

## 2013-06-22 LAB — OSMOLALITY, URINE: Osmolality, Ur: 432 mOsm/kg (ref 390–1090)

## 2013-06-22 MED ORDER — ENOXAPARIN SODIUM 30 MG/0.3ML ~~LOC~~ SOLN
30.0000 mg | SUBCUTANEOUS | Status: DC
  Administered 2013-06-22 – 2013-06-23 (×2): 30 mg via SUBCUTANEOUS
  Filled 2013-06-22 (×3): qty 0.3

## 2013-06-22 MED ORDER — SODIUM BICARBONATE 650 MG PO TABS
1300.0000 mg | ORAL_TABLET | Freq: Two times a day (BID) | ORAL | Status: DC
  Administered 2013-06-22 – 2013-06-23 (×3): 1300 mg via ORAL
  Filled 2013-06-22 (×4): qty 2

## 2013-06-22 NOTE — Progress Notes (Signed)
[facility] MSW note: This is a hospice related admission. Pt remains a full code. Pt asleep during visit. MSW met with spouse. Spouse discussed how pt is eating less now and has not been able to take his medication today so far. Spouse stated that pt is on the [facility] waiting list. Spouse is also considering SNF after hospital discharge. MSW offered support and comfort as spouse discussed pt's status. Please call with concerns or questions.   Marilynne Halsted, MSW [phone]

## 2013-06-22 NOTE — Progress Notes (Signed)
Clinical Social Work Department BRIEF PSYCHOSOCIAL ASSESSMENT 06/22/2013  Patient:  Jerry [name], Jerry [name]     Account Number:  [id number]     Admit date:  06/12/2013  Clinical Social Worker:  Levie Heritage  Date/Time:  06/22/2013 12:44 PM  Referred by:  Physician  Date Referred:  06/22/2013 Referred for  Residential hospice placement   Other Referral:   Interview type:  Other - See comment Other interview type:   Pt's wife    PSYCHOSOCIAL DATA Living Status:  FAMILY Admitted from facility:   Level of care:   Primary support name:  Mrs. Wynonia Lawman Primary support relationship to patient:  SPOUSE Degree of support available:   strong    CURRENT CONCERNS Current Concerns  Post-Acute Placement   Other Concerns:   CSW thanked Mrs. Ludvigsen for her time.    SOCIAL WORK ASSESSMENT / PLAN Met with Pt's wife outside of Pt's room, as Pt sleeping soundly.    Pt's wife stated that is currently followed at home by [facility] and that they would like to go to [facility] upon d/c.    Mrs. Prochnow gave CSW permission to notify [facility], as well as to make a referral to [facility] and to inquire about [place] [facility] [place] [facility].  Mrs. Sakuma understands that [facility] may not have beds available and she is agreeable to exploring these other options.    CSW provided Mrs. Wynonia Lawman with SNF and residential Hospice lists.    CSW thanked Mrs. Mcmurtrey for her time.  Referral made to [facility].   Assessment/plan status:  Psychosocial Support/Ongoing Assessment of Needs Other assessment/ plan:   Information/referral to community resources:   SNF lists  Residential Hospice list    PATIENT'S/FAMILY'S RESPONSE TO PLAN OF CARE: Mrs. Hanton was coping well with Pt's prognosis and was hopeful that Pt could go to a Hospice home instead of SNF.    Mrs. Console was pleasant and cooperative and thanked CSW for time and assistance.   Bernita Raisin,  [facility] [facility] [phone]

## 2013-06-22 NOTE — Progress Notes (Signed)
Clinical Social Work Department CLINICAL SOCIAL WORK PLACEMENT NOTE 06/22/2013  Patient:  Jerry [name],Elster  Account Number:  [id number] Admit date:  06/12/2013  Clinical Social Worker:  Doroteo GlassmanAMANDA Jordon Bourquin, LCSWA  Date/time:  06/22/2013 12:49 PM  Clinical Social Work is seeking post-discharge placement for this patient at the following level of care:   SKILLED NURSING   (*CSW will update this form in Epic as items are completed)   06/22/2013  Patient/family provided with [facility] of Clinical Social Work's list of facilities offering this level of care within the geographic area requested by the patient (or if unable, by the patient's family).  06/22/2013  Patient/family informed of their freedom to choose among providers that offer the needed level of care, that participate in Medicare, Medicaid or managed care program needed by the patient, have an available bed and are willing to accept the patient.  06/22/2013  Patient/family informed of [facility]' ownership interest in [facility], as well as of the fact that they are under no obligation to receive care at this facility.  PASARR submitted to EDS on 06/22/2013 PASARR number received from EDS on 06/22/2013  FL2 transmitted to all facilities in geographic area requested by pt/family on  06/22/2013 FL2 transmitted to all facilities within larger geographic area on   Patient informed that his/her managed care company has contracts with or will negotiate with  certain facilities, including the following:     Patient/family informed of bed offers received:   Patient chooses bed at  Physician recommends and patient chooses bed at    Patient to be transferred to  on   Patient to be transferred to facility by   The following physician request were entered in Epic:   Additional Comments:  Providence CrosbyAmanda Hadden Steig, Theresia MajorsLCSWA Clinical Social Work [phone]

## 2013-06-23 DIAGNOSIS — E875 Hyperkalemia: Secondary | ICD-10-CM

## 2013-06-23 DIAGNOSIS — E872 Acidosis, unspecified: Secondary | ICD-10-CM

## 2013-06-23 LAB — BASIC METABOLIC PANEL
BUN: 106 mg/dL — AB (ref 6–23)
CHLORIDE: 98 meq/L (ref 96–112)
CO2: 14 meq/L — AB (ref 19–32)
CREATININE: 3.41 mg/dL — AB (ref 0.50–1.35)
Calcium: 9.1 mg/dL (ref 8.4–10.5)
GFR calc Af Amer: 19 mL/min — ABNORMAL LOW (ref >90)
GFR calc non Af Amer: 16 mL/min — ABNORMAL LOW (ref >90)
Glucose, Bld: 229 mg/dL — ABNORMAL HIGH (ref 70–99)
Potassium: 5.4 mEq/L — ABNORMAL HIGH (ref 3.7–5.3)
Sodium: 131 mEq/L — ABNORMAL LOW (ref 137–147)

## 2013-06-23 LAB — GLUCOSE, CAPILLARY
GLUCOSE-CAPILLARY: 240 mg/dL — AB (ref 70–99)
Glucose-Capillary: 151 mg/dL — ABNORMAL HIGH (ref 70–99)
Glucose-Capillary: 228 mg/dL — ABNORMAL HIGH (ref 70–99)
Glucose-Capillary: 188 mg/dL — ABNORMAL HIGH (ref 70–99)

## 2013-06-23 MED ORDER — CIPROFLOXACIN IN D5W 400 MG/200ML IV SOLN
400.0000 mg | Freq: Once | INTRAVENOUS | Status: AC
  Administered 2013-06-23: 21:00:00 400 mg via INTRAVENOUS
  Filled 2013-06-23: qty 200

## 2013-06-23 MED ORDER — SODIUM BICARBONATE 650 MG PO TABS
1300.0000 mg | ORAL_TABLET | Freq: Three times a day (TID) | ORAL | Status: DC
  Administered 2013-06-23 – 2013-06-25 (×2): 1300 mg via ORAL
  Filled 2013-06-23 (×7): qty 2

## 2013-06-23 MED ORDER — SODIUM POLYSTYRENE SULFONATE 15 GM/60ML PO SUSP
15.0000 g | Freq: Once | ORAL | Status: AC
  Administered 2013-06-23: 15 g via ORAL
  Filled 2013-06-23: qty 60

## 2013-06-23 MED ORDER — CIPROFLOXACIN HCL 500 MG PO TABS
500.0000 mg | ORAL_TABLET | Freq: Every day | ORAL | Status: DC
  Administered 2013-06-25: 500 mg via ORAL
  Filled 2013-06-23 (×3): qty 1

## 2013-06-23 MED ORDER — PANTOPRAZOLE SODIUM 40 MG IV SOLR
40.0000 mg | Freq: Once | INTRAVENOUS | Status: AC
  Administered 2013-06-23: 40 mg via INTRAVENOUS
  Filled 2013-06-23: qty 40

## 2013-06-23 NOTE — Progress Notes (Signed)
[facility] PROGRESS NOTE (LATE ENTRY)   Jerry [name] AGT:[id number] DOB: 1940-12-10 DOA: 06/12/2013 PCP: Simona Huh, MD  Brief narrative: Jerry [name] is an 73 y.o. male with a PMH of cirrhosis of the liver secondary to [facility], recurrent accumulating ascites requiring frequent paracentesis, hypertension, hyperlipidemia, diabetes, stage II-3 chronic kidney disease with baseline creatinine 1.2-1.6, chronic anemia with baseline hemoglobin around 11 mg/dL who was admitted on 06/12/2013 with altered mental status. Upon initial evaluation, the patient was hyponatremic with elevated ammonia levels and evidence of pneumonia on chest x-ray.  Assessment/Plan: Principal Problem:   Acute respiratory failure secondary to community-acquired pneumonia Initial chest x-ray showed pneumonia. He was initially treated with broad-spectrum antibiotics including vancomycin and cefepime which was later transitioned to oral Levaquin. Repeat chest x-ray showed improvement. Septic workup including urinary Legionella antigen studies and strep pneumonia antigen studies were negative, respiratory virus panel negative, blood cultures negative to date, sputum culture not representative of lower respiratory tract secretions. Continue supplemental oxygen and bronchodilator therapy as needed. Active Problems:   Urinary retention Continue foley.   Diarrhea On Lactulose.  C. Diff negative.   Ascites Paracentesis done 06/16/2013 with 6.2 L withdrawn and 06/20/13 with 6.1 L withdrawn. Given 25 mg of albumin each time.  Typically gets these done weekly.     Acute on chronic renal failure Thought to be prerenal versus hepatorenal syndrome. Did not respond to IV fluids. Nephrology consultation performed by Dr. Jonnie Finner on 06/16/2013. Baseline creatinine 1.2-1.6. Current creatinine slightly higher than usual baseline values.   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 Seen by Dr.  Lovena Le from the palliative care team on 06/17/2013. Patient remains a full code.   Hyponatremia Multifactorial with cirrhosis physiology and volume depletion contributory. 3% saline initiated on 06/16/2013 under the direction of nephrology. Sodium steadily improving from a low of 114---> 125--->127--->128--->131--->129.   Diabetes mellitus, type II Hemoglobin A1c 7.1% on 06/02/2013. Continue Tradjenta, Lantus 30 units daily, and resistant SSI 3 times a day. CBGs 137-185.    Hepatic encephalopathy Continue lactulose and Rifaximin.    Protein-calorie malnutrition, severe Being followed by dietitian. Continue supplements.   Hyperkalemia Resolved with Kayexalate.   Leukocytosis, unspecified Resolved with treatment of pneumonia.  Code Status: Full. Family Communication: Wife updated at bedside. Disposition Plan: Home when stable.  IV access:  PICC line placed 06/16/2013  Medical Consultants:  None  Other Consultants:  Dietician  Anti-infectives: Vancomycin 12/25- 12/28  Cefepime 12/25- 12/28  levaquin 12/28. >>   HPI/Subjective: Jerry [name] is tearful today.  He is sad about his condition, and knows his time is limited.  He has been refusing his lactulose because he doesn't want the nurses to have to clean him up.  Still feels unwell.  Objective: Filed Vitals:   06/22/13 1400 06/22/13 2110 06/23/13 0550 06/23/13 0600  BP: 101/67 104/69 101/64 130/76  Pulse: 97 88 86 102  Temp: 97.6 F (36.4 C) 98.5 F (36.9 C) 97.9 F (36.6 C) 98.7 F (37.1 C)  TempSrc: Oral Axillary Oral Oral  Resp: '16 16 20 20  ' Height:      Weight:      SpO2: 95% 97% 96% 95%    Intake/Output Summary (Last 24 hours) at 06/23/13 1239 Last data filed at 06/23/13 0820  Gross per 24 hour  Intake    485 ml  Output    376 ml  Net    109 ml    Exam: Gen:  NAD, tearful Cardiovascular:  Tachycardic,  No M/R/G Respiratory:  Lungs CTAB Gastrointestinal:  Abdomen softly distended, NT, +  BS Extremities:  No C/E/C  Data Reviewed: Basic Metabolic Panel:  Recent Labs Lab 06/16/13 1709  06/17/13 0510  06/18/13 2220 06/19/13 0415 06/19/13 0433 06/21/13 0610 06/22/13 0402  NA 116*  < > 122*  < > 125* 127* 128* 131* 129*  K 4.5  --  4.6  --   --  4.5  --  4.6 4.9  CL 83*  --  88*  --   --  97  --  101 98  CO2 19  --  19  --   --  18*  --  16* 15*  GLUCOSE 171*  --  142*  --   --  227*  --  157* 172*  BUN 61*  --  64*  --   --  62*  --  76* 86*  CREATININE 1.78*  --  1.75*  --   --  1.72*  --  2.25* 2.57*  CALCIUM 9.1  --  8.9  --   --  8.7  --  8.9 8.9  < > = values in this interval not displayed. GFR Estimated Creatinine Clearance: 25.2 ml/min (by C-G formula based on Cr of 2.57). Liver Function Tests:  Recent Labs Lab 06/19/13 0415  AST 100*  ALT 90*  ALKPHOS 326*  BILITOT 1.4*  PROT 6.1  ALBUMIN 2.8*    Recent Labs Lab 06/19/13 0414 06/21/13 0610  AMMONIA 39 40    CBC:  Recent Labs Lab 06/17/13 0510 06/19/13 0415  WBC 10.1 11.7*  HGB 13.6 15.5  HCT 37.2* 44.1  MCV 88.8 91.7  PLT 111* 119*   BNP (last 3 results)  Recent Labs  04/06/13 0518 04/07/13 0500 06/12/13 1056  PROBNP 73.5 66.0 447.5*   CBG:  Recent Labs Lab 06/22/13 0725 06/22/13 1207 06/22/13 1621 06/22/13 2213 06/23/13 0726  GLUCAP 137* 156* 185* 181* 151*   icrobiology Recent Results (from the past 240 hour(s))  RESPIRATORY VIRUS PANEL     Status: None   Collection Time    06/14/13  5:36 AM      Result Value Range Status   Source - RVPAN NOSE   Final   Respiratory Syncytial Virus A NOT DETECTED   Final   Respiratory Syncytial Virus B NOT DETECTED   Final   Influenza A NOT DETECTED   Final   Influenza B NOT DETECTED   Final   Parainfluenza 1 NOT DETECTED   Final   Parainfluenza 2 NOT DETECTED   Final   Parainfluenza 3 NOT DETECTED   Final   Metapneumovirus NOT DETECTED   Final   Rhinovirus NOT DETECTED   Final   Adenovirus NOT DETECTED   Final    Influenza A H1 NOT DETECTED   Final   Influenza A H3 NOT DETECTED   Final   Comment: (NOTE)           Normal Reference Range for each Analyte: NOT DETECTED     Testing performed using the Luminex xTAG Respiratory Viral Panel test     kit.     This test was developed and its performance characteristics determined     by [employer]. It has not been cleared or approved by the [place]     Food and Drug Administration. This test is used for clinical purposes.     It should not be regarded as investigational or for research. This  laboratory is certified under the Clinical Laboratory Improvement     Amendments of 1988 (CLIA) as qualified to perform high complexity     clinical laboratory testing.     Performed at [facility] PCR SCREENING     Status: Abnormal   Collection Time    06/16/13  8:37 PM      Result Value Range Status   MRSA by PCR POSITIVE (*) NEGATIVE Final   Comment:            The GeneXpert MRSA Assay (FDA     approved for NASAL specimens     only), is one component of a     comprehensive MRSA colonization     surveillance program. It is not     intended to diagnose MRSA     infection nor to guide or     monitor treatment for     MRSA infections.     RESULT CALLED TO, READ BACK BY AND VERIFIED WITH:     ALDRIDGE,D/2W '@2219'  ON 06/16/13 BY KARCZEWSKI,S.  CLOSTRIDIUM DIFFICILE BY PCR     Status: None   Collection Time    06/17/13  5:52 AM      Result Value Range Status   C difficile by pcr NEGATIVE  NEGATIVE Final   Comment: Performed at [place], EXPECTORATED SPUTUM-ASSESSMENT     Status: None   Collection Time    06/17/13  4:11 PM      Result Value Range Status   Specimen Description SPU   Final   Special Requests NONE   Final   Sputum evaluation     Final   Value: MICROSCOPIC FINDINGS SUGGEST THAT THIS SPECIMEN IS NOT REPRESENTATIVE OF LOWER RESPIRATORY SECRETIONS. PLEASE RECOLLECT.     RESULTS CALLED TO Dewain Penning  [id number] @ [facility]   Report Status 06/17/2013 FINAL   Final     Procedures and Diagnostic Studies: Dg Chest 2 View  06/14/2013   CLINICAL DATA:  Cough.  FOLLOW UP pneumonia.  EXAM: CHEST  2 VIEW  COMPARISON:  06/12/2013  FINDINGS: Mild basilar lung opacity is noted, similar on the left and mildly increased on the right, most consistent with atelectasis. No convincing pneumonia. Lung volumes are low accentuating the lung base opacity.  No pulmonary edema.  No pleural effusion or pneumothorax.  The heart, mediastinum and hila are unremarkable.  IMPRESSION: Mild lung base opacity as described most consistent with atelectasis.   Electronically Signed   By: Lajean Manes M.D.   On: 06/14/2013 17:37   Dg Chest 2 View  06/12/2013   CLINICAL DATA:  Cough and nausea.  EXAM: CHEST  2 VIEW  COMPARISON:  Chest radiograph performed 06/02/2013  FINDINGS: The lungs are hypoexpanded. Mild left basilar opacity may reflect atelectasis or possibly mild pneumonia. There is no evidence of pleural effusion or pneumothorax.  The heart is normal in size; the mediastinal contour is within normal limits. No acute osseous abnormalities are seen.  IMPRESSION: Lungs hypoexpanded. Mild left basilar airspace opacity may reflect atelectasis or possibly mild pneumonia.   Electronically Signed   By: Garald Balding M.D.   On: 06/12/2013 04:30   Dg Chest 2 View  06/02/2013   CLINICAL DATA:  Altered mental still  EXAM: CHEST  2 VIEW  COMPARISON:  Chest x-ray from yesterday  FINDINGS: Low lung volumes persist, with bibasilar atelectasis. No edema, pneumothorax, or consolidation. Borderline cardiomegaly.  IMPRESSION: Persistently low  lung volumes, with basilar atelectasis.   Electronically Signed   By: Jorje Guild M.D.   On: 06/02/2013 03:55   Dg Chest 2 View  06/01/2013   CLINICAL DATA:  Cough, dyspnea  EXAM: CHEST  2 VIEW  COMPARISON:  04/06/2013.  FINDINGS: Low lung volumes. Areas of increased density project within the  lung bases. The osseous structures are unremarkable. The cardiac silhouette is obscured.  IMPRESSION: Low lung volumes. Atelectasis versus infiltrate within the lung bases. Repeat evaluation with deeper inspiration recommended.   Electronically Signed   By: Margaree Mackintosh M.D.   On: 06/01/2013 12:44   Ct Head Wo Contrast  06/02/2013   CLINICAL DATA:  Expressive aphasia  EXAM: CT HEAD WITHOUT CONTRAST  TECHNIQUE: Contiguous axial images were obtained from the base of the skull through the vertex without intravenous contrast.  COMPARISON:  10/02/2010  FINDINGS: Skull and Sinuses:No significant abnormality.  Orbits: No acute abnormality.  Brain: No evidence of acute abnormality, such as acute infarction, hemorrhage, hydrocephalus, or mass lesion/mass effect. Age appropriate cerebral volume loss.  IMPRESSION: No evidence of acute intracranial disease.   Electronically Signed   By: Jorje Guild M.D.   On: 06/02/2013 04:07   US Paracentesis  06/16/2013   CLINICAL DATA:  Cirrhosis, recurrent ascites, request for therapeutic paracentesis.  EXAM: ULTRASOUND GUIDED PARACENTESIS  TECHNIQUE: Survey ultrasound of the abdomen was performed and an appropriate skin entry site in the RLQ abdomen was selected. Skin site was marked, prepped with Betadine, and draped in usual sterile fashion, and infiltrated locally with 1% lidocaine. A 5 French multi-sidehole Yueh sheath needle was advanced into the peritoneal space until fluid could be aspirated. The sheath was advanced and the needle removed. 6.2 liters of cloudy yellow ascites were aspirated. No immediate complication.  IMPRESSION: Technically successful ultrasound guided paracentesis, removing 6.2 liters of ascites.  Read By:  Tsosie Billing PA-C   Electronically Signed   By: Markus Daft M.D.   On: 06/16/2013 15:11   US Paracentesis  06/10/2013   CLINICAL DATA:  Cirrhosis, recurrent ascites. Request is made for therapeutic paracentesis.  EXAM: ULTRASOUND GUIDED  THERAPEUTIC PARACENTESIS  COMPARISON:  PREVIOUS PARACENTESIS ON 05/22/2013.  PROCEDURE: An ultrasound guided paracentesis was thoroughly discussed with the patient and questions answered. The benefits, risks, alternatives and complications were also discussed. The patient understands and wishes to proceed with the procedure. Written consent was obtained.  Ultrasound was performed to localize and mark an adequate pocket of fluid in the left mid to lower quadrant of the abdomen. The area was then prepped and draped in the normal sterile fashion. 1% Lidocaine was used for local anesthesia. Under ultrasound guidance a 19 gauge Yueh catheter was introduced. Paracentesis was performed. The catheter was removed and a dressing applied.  Complications: None.  FINDINGS: A total of approximately 2.9 liters of turbid, yellow fluid was removed.  IMPRESSION: Successful ultrasound guided therapeutic paracentesis yielding 2.9 liters of ascites.  Read by: Rowe Robert ,P.A.-C.   Electronically Signed   By: Sandi Mariscal M.D.   On: 06/10/2013 13:02   US Paracentesis  06/05/2013   CLINICAL DATA:  Cirrhosis, recurrent ascites  EXAM: ULTRASOUND GUIDED PARACENTESIS  TECHNIQUE: Survey ultrasound of the abdomen was performed and an appropriate skin entry site in the RUQ abdomen was selected. Skin site was marked, prepped with Betadine, and draped in usual sterile fashion, and infiltrated locally with 1% lidocaine. A 5 French multisidehole Yueh sheath needle was advanced into the peritoneal space  until fluid could be aspirated. The sheath was advanced and the needle removed. 4.5 liters of milky yellow colored ascites were aspirated. No immediate complication.  IMPRESSION: Technically successful ultrasound guided paracentesis, removing 4.5 liters of ascites.  Read By:  Tsosie Billing PA-C   Electronically Signed   By: Maryclare Bean M.D.   On: 06/05/2013 16:08   US Paracentesis  05/22/2013   CLINICAL DATA:  Cirrhosis, recurrent ascites  EXAM:  ULTRASOUND GUIDED PARACENTESIS  TECHNIQUE: Survey ultrasound of the abdomen was performed and an appropriate skin entry site in the RLQ abdomen was selected. Skin site was marked, prepped with Betadine, and draped in usual sterile fashion, and infiltrated locally with 1% lidocaine. A 5 French multi-sidehole Yueh sheath needle was advanced into the peritoneal space until fluid could be aspirated. The sheath was advanced and the needle removed. 9.5 liters of milky yellow/light brown colorascites were aspirated. No immediate complication.  IMPRESSION: Technically successful ultrasound guided paracentesis, removing 9.5 liters of ascites.  Read By:  Tsosie Billing PA-C   Electronically Signed   By: Markus Daft M.D.   On: 05/22/2013 11:54    Scheduled Meds: . enoxaparin (LOVENOX) injection  30 mg Subcutaneous Q24H  . feeding supplement (GLUCERNA SHAKE)  237 mL Oral BID BM  . insulin aspart  0-20 Units Subcutaneous TID WC  . insulin aspart  0-5 Units Subcutaneous QHS  . insulin glargine  30 Units Subcutaneous Daily  . lactose free nutrition  237 mL Oral Daily  . lactulose  10 g Oral BID  . linagliptin  5 mg Oral Daily  . pantoprazole  40 mg Oral BID AC  . rifaximin  550 mg Oral BID  . sodium bicarbonate  1,300 mg Oral BID  . sodium chloride  10-40 mL Intracatheter Q12H  . sodium chloride  2 g Oral BID WC   Continuous Infusions:    Time spent: 25 minutes.    LOS: 11 days   [facility] Pager [phone].   *Please note that the hospitalists switch teams on Wednesdays. Please call the flow manager at [phone] if you are having difficulty reaching the hospitalist taking care of this patient as she can update you and provide the most up-to-date pager number of provider caring for the patient. If 8PM-8AM, please contact night-coverage at [email or web address], password [facility]  06/23/2013, 12:39 PM

## 2013-06-23 NOTE — Progress Notes (Signed)
CSW assisting with d/c planning. [facility] has no availability today. CS[facility] has [facility]. Awaiting response. Family has been provided with SNF bed offers. They are considering all options. CSW will continue to assist with d/c planning.  Cori RazorJamie Allexis Bordenave LCSW [phone]

## 2013-06-23 NOTE — Progress Notes (Signed)
Room SL 1618 - Jerry BeadyFranklin [name] -[facility] RN [facility]-R.Jerry Skeens RN  [facility].  Pt is FULL code.    Pt alert & oriented, lying in bed, without complaints of pain or discomfort.    Wife, son and dtr present. Patient's home medication list is on shadow chart.   Pt is ready for discharge, family cannot care for pt at home - requesting BP but no beds available.  [facility] investigating room availability.  Discussed and educated pt and family of pt's code status and they prefer to remain full code.  [facility] SW Jerry HuhMarta made aware of possible discharge.  Please call [facility] @ [phone]- ask for RN Liaison or after hours,ask for on-call RN with any hospice needs.   Thank you.  Jerry Boersose B Arley Garant, RN  [facility]  Hospice Liaison  [phone])

## 2013-06-23 NOTE — Progress Notes (Addendum)
[facility] PROGRESS NOTE   Jerry [name] SAY:[id number] DOB: 25-Feb-1941 DOA: 06/12/2013 PCP: Simona Huh, MD  Brief narrative: Jerry [name] is an 73 y.o. male with a PMH of cirrhosis of the liver secondary to [facility], recurrent accumulating ascites requiring frequent paracentesis, hypertension, hyperlipidemia, diabetes, stage II-3 chronic kidney disease with baseline creatinine 1.2-1.6, chronic anemia with baseline hemoglobin around 11 mg/dL who was admitted on 06/12/2013 with altered mental status. Upon initial evaluation, the patient was hyponatremic with elevated ammonia levels and evidence of pneumonia on chest x-ray.  Awaiting residential hospice placement.  Assessment/Plan: Principal Problem:   Acute respiratory failure secondary to community-acquired pneumonia Initial chest x-ray showed pneumonia. He was initially treated with broad-spectrum antibiotics including vancomycin and cefepime which was later transitioned to oral Levaquin (completed therapy on 06/17/13). Repeat chest x-ray showed improvement. Septic workup including urinary Legionella antigen studies and strep pneumonia antigen studies were negative, respiratory virus panel negative, blood cultures negative, sputum culture not representative of lower respiratory tract secretions. Continue supplemental oxygen and bronchodilator therapy as needed. Active Problems:   Urinary retention Continue foley.   Diarrhea On Lactulose.  C. Diff negative.   Ascites Paracentesis done 06/16/2013 with 6.2 L withdrawn and 06/20/13 with 6.1 L withdrawn. Given 25 mg of albumin each time.  Typically gets these done weekly.  Spoke with Dr. Cristina Gong about placement of a permanent peritoneal catheter, given poor prognosis and desire for comfort measures, will have this placed by IR.  Will also start Cipro 500 mg daily for SBP prophylaxis.   Acute on chronic renal failure / metabolic acidosis / hyperkalemia Thought to be prerenal versus  hepatorenal syndrome. Did not respond to IV fluids. Nephrology consultation performed by Dr. Jonnie Finner on 06/16/2013. Baseline creatinine 1.2-1.6. Creatinine rising with worsening metabolic acidosis and hyperkalemia.  Increase sodium bicarbonate.  Give kayexalate.   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 Seen by Dr. Lovena Le from the palliative care team on 06/17/2013. Patient remains a full code.   Hyponatremia Multifactorial with cirrhosis physiology and volume depletion contributory. 3% saline initiated on 06/16/2013 under the direction of nephrology. Sodium steadily improving from a low of 114---> 125--->127--->128--->131--->129.   Diabetes mellitus, type II Hemoglobin A1c 7.1% on 06/02/2013. Continue Tradjenta, Lantus 30 units daily, and resistant SSI 3 times a day. CBGs 137-185.    Hepatic encephalopathy Continue lactulose and Rifaximin.    Protein-calorie malnutrition, severe Being followed by dietitian. Continue supplements.   Leukocytosis, unspecified Resolved with treatment of pneumonia.  Code Status: Full. Family Communication: Wife updated at bedside. Disposition Plan: Home when stable.  IV access:  PICC line placed 06/16/2013  Medical Consultants:  None  Other Consultants:  Dietician  Anti-infectives: Vancomycin 12/25- 12/28  Cefepime 12/25- 12/28  levaquin 12/28. >> 12/30    HPI/Subjective: Jerry [name] is in better spirits today.  Still feels poorly, no appetite, no energy.  Not as sad.  No nausea or vomiting.  No complaints of pain.  Objective: Filed Vitals:   06/22/13 2110 06/23/13 0550 06/23/13 0600 06/23/13 1511  BP: 104/69 101/64 130/76   Pulse: 88 86 102   Temp: 98.5 F (36.9 C) 97.9 F (36.6 C) 98.7 F (37.1 C) 98.3 F (36.8 C)  TempSrc: Axillary Oral Oral Oral  Resp: _0 Height:      Weight:      SpO2: 97% 96% 95%     Intake/Output Summary (Last 24 hours) at 06/23/13 1704 Last data filed at  06/23/13 0820  Gross per 24 hour  Intake    245 ml  Output    226 ml  Net     19 ml    Exam: Gen:  NAD, tearful Cardiovascular:  Tachycardic, No M/R/G Respiratory:  Lungs CTAB Gastrointestinal:  Abdomen softly distended, NT, + BS Extremities:  No C/E/C  Data Reviewed: Basic Metabolic Panel:  Recent Labs Lab 06/17/13 0510  06/19/13 0415 06/19/13 0433 06/21/13 0610 06/22/13 0402 06/23/13 1230  NA 122*  < > 127* 128* 131* 129* 131*  K 4.6  --  4.5  --  4.6 4.9 5.4*  CL 88*  --  97  --  101 98 98  CO2 19  --  18*  --  16* 15* 14*  GLUCOSE 142*  --  227*  --  157* 172* 229*  BUN 64*  --  62*  --  76* 86* 106*  CREATININE 1.75*  --  1.72*  --  2.25* 2.57* 3.41*  CALCIUM 8.9  --  8.7  --  8.9 8.9 9.1  < > = values in this interval not displayed. GFR Estimated Creatinine Clearance: 19 ml/min (by C-G formula based on Cr of 3.41). Liver Function Tests:  Recent Labs Lab 06/19/13 0415  AST 100*  ALT 90*  ALKPHOS 326*  BILITOT 1.4*  PROT 6.1  ALBUMIN 2.8*    Recent Labs Lab 06/19/13 0414 06/21/13 0610  AMMONIA 39 40    CBC:  Recent Labs Lab 06/17/13 0510 06/19/13 0415  WBC 10.1 11.7*  HGB 13.6 15.5  HCT 37.2* 44.1  MCV 88.8 91.7  PLT 111* 119*   BNP (last 3 results)  Recent Labs  04/06/13 0518 04/07/13 0500 06/12/13 1056  PROBNP 73.5 66.0 447.5*   CBG:  Recent Labs Lab 06/22/13 0725 06/22/13 1207 06/22/13 1621 06/22/13 2213 06/23/13 0726  GLUCAP 137* 156* 185* 181* 151*   icrobiology Recent Results (from the past 240 hour(s))  RESPIRATORY VIRUS PANEL     Status: None   Collection Time    06/14/13  5:36 AM      Result Value Range Status   Source - RVPAN NOSE   Final   Respiratory Syncytial Virus A NOT DETECTED   Final   Respiratory Syncytial Virus B NOT DETECTED   Final   Influenza A NOT DETECTED   Final   Influenza B NOT DETECTED   Final   Parainfluenza 1 NOT DETECTED   Final   Parainfluenza 2 NOT DETECTED   Final   Parainfluenza  3 NOT DETECTED   Final   Metapneumovirus NOT DETECTED   Final   Rhinovirus NOT DETECTED   Final   Adenovirus NOT DETECTED   Final   Influenza A H1 NOT DETECTED   Final   Influenza A H3 NOT DETECTED   Final   Comment: (NOTE)           Normal Reference Range for each Analyte: NOT DETECTED     Testing performed using the Luminex xTAG Respiratory Viral Panel test     kit.     This test was developed and its performance characteristics determined     by [employer]. It has not been cleared or approved by the [place]     Food and Drug Administration. This test is used for clinical purposes.     It should not be regarded as investigational or for research. This     laboratory is certified under the Clinical Laboratory Improvement  Amendments of 1988 (CLIA) as qualified to perform high complexity     clinical laboratory testing.     Performed at [facility] PCR SCREENING     Status: Abnormal   Collection Time    06/16/13  8:37 PM      Result Value Range Status   MRSA by PCR POSITIVE (*) NEGATIVE Final   Comment:            The GeneXpert MRSA Assay (FDA     approved for NASAL specimens     only), is one component of a     comprehensive MRSA colonization     surveillance program. It is not     intended to diagnose MRSA     infection nor to guide or     monitor treatment for     MRSA infections.     RESULT CALLED TO, READ BACK BY AND VERIFIED WITH:     ALDRIDGE,D/2W _0  ON 06/16/13 BY KARCZEWSKI,S.  CLOSTRIDIUM DIFFICILE BY PCR     Status: None   Collection Time    06/17/13  5:52 AM      Result Value Range Status   C difficile by pcr NEGATIVE  NEGATIVE Final   Comment: Performed at [facility], EXPECTORATED SPUTUM-ASSESSMENT     Status: None   Collection Time    06/17/13  4:11 PM      Result Value Range Status   Specimen Description SPU   Final   Special Requests NONE   Final   Sputum evaluation     Final   Value: MICROSCOPIC FINDINGS  SUGGEST THAT THIS SPECIMEN IS NOT REPRESENTATIVE OF LOWER RESPIRATORY SECRETIONS. PLEASE RECOLLECT.     RESULTS CALLED TO Dewain Penning [id number] @ [facility]   Report Status 06/17/2013 FINAL   Final     Procedures and Diagnostic Studies: Dg Chest 2 View  06/14/2013   CLINICAL DATA:  Cough.  FOLLOW UP pneumonia.  EXAM: CHEST  2 VIEW  COMPARISON:  06/12/2013  FINDINGS: Mild basilar lung opacity is noted, similar on the left and mildly increased on the right, most consistent with atelectasis. No convincing pneumonia. Lung volumes are low accentuating the lung base opacity.  No pulmonary edema.  No pleural effusion or pneumothorax.  The heart, mediastinum and hila are unremarkable.  IMPRESSION: Mild lung base opacity as described most consistent with atelectasis.   Electronically Signed   By: Lajean Manes M.D.   On: 06/14/2013 17:37   Dg Chest 2 View  06/12/2013   CLINICAL DATA:  Cough and nausea.  EXAM: CHEST  2 VIEW  COMPARISON:  Chest radiograph performed 06/02/2013  FINDINGS: The lungs are hypoexpanded. Mild left basilar opacity may reflect atelectasis or possibly mild pneumonia. There is no evidence of pleural effusion or pneumothorax.  The heart is normal in size; the mediastinal contour is within normal limits. No acute osseous abnormalities are seen.  IMPRESSION: Lungs hypoexpanded. Mild left basilar airspace opacity may reflect atelectasis or possibly mild pneumonia.   Electronically Signed   By: Garald Balding M.D.   On: 06/12/2013 04:30   Dg Chest 2 View  06/02/2013   CLINICAL DATA:  Altered mental still  EXAM: CHEST  2 VIEW  COMPARISON:  Chest x-ray from yesterday  FINDINGS: Low lung volumes persist, with bibasilar atelectasis. No edema, pneumothorax, or consolidation. Borderline cardiomegaly.  IMPRESSION: Persistently low lung volumes, with basilar atelectasis.   Electronically Signed   By:  Jorje Guild M.D.   On: 06/02/2013 03:55   Dg Chest 2 View  06/01/2013   CLINICAL DATA:   Cough, dyspnea  EXAM: CHEST  2 VIEW  COMPARISON:  04/06/2013.  FINDINGS: Low lung volumes. Areas of increased density project within the lung bases. The osseous structures are unremarkable. The cardiac silhouette is obscured.  IMPRESSION: Low lung volumes. Atelectasis versus infiltrate within the lung bases. Repeat evaluation with deeper inspiration recommended.   Electronically Signed   By: Margaree Mackintosh M.D.   On: 06/01/2013 12:44   Ct Head Wo Contrast  06/02/2013   CLINICAL DATA:  Expressive aphasia  EXAM: CT HEAD WITHOUT CONTRAST  TECHNIQUE: Contiguous axial images were obtained from the base of the skull through the vertex without intravenous contrast.  COMPARISON:  10/02/2010  FINDINGS: Skull and Sinuses:No significant abnormality.  Orbits: No acute abnormality.  Brain: No evidence of acute abnormality, such as acute infarction, hemorrhage, hydrocephalus, or mass lesion/mass effect. Age appropriate cerebral volume loss.  IMPRESSION: No evidence of acute intracranial disease.   Electronically Signed   By: Jorje Guild M.D.   On: 06/02/2013 04:07   US Paracentesis  06/16/2013   CLINICAL DATA:  Cirrhosis, recurrent ascites, request for therapeutic paracentesis.  EXAM: ULTRASOUND GUIDED PARACENTESIS  TECHNIQUE: Survey ultrasound of the abdomen was performed and an appropriate skin entry site in the RLQ abdomen was selected. Skin site was marked, prepped with Betadine, and draped in usual sterile fashion, and infiltrated locally with 1% lidocaine. A 5 French multi-sidehole Yueh sheath needle was advanced into the peritoneal space until fluid could be aspirated. The sheath was advanced and the needle removed. 6.2 liters of cloudy yellow ascites were aspirated. No immediate complication.  IMPRESSION: Technically successful ultrasound guided paracentesis, removing 6.2 liters of ascites.  Read By:  Tsosie Billing PA-C   Electronically Signed   By: Markus Daft M.D.   On: 06/16/2013 15:11   US  Paracentesis  06/10/2013   CLINICAL DATA:  Cirrhosis, recurrent ascites. Request is made for therapeutic paracentesis.  EXAM: ULTRASOUND GUIDED THERAPEUTIC PARACENTESIS  COMPARISON:  PREVIOUS PARACENTESIS ON 05/22/2013.  PROCEDURE: An ultrasound guided paracentesis was thoroughly discussed with the patient and questions answered. The benefits, risks, alternatives and complications were also discussed. The patient understands and wishes to proceed with the procedure. Written consent was obtained.  Ultrasound was performed to localize and mark an adequate pocket of fluid in the left mid to lower quadrant of the abdomen. The area was then prepped and draped in the normal sterile fashion. 1% Lidocaine was used for local anesthesia. Under ultrasound guidance a 19 gauge Yueh catheter was introduced. Paracentesis was performed. The catheter was removed and a dressing applied.  Complications: None.  FINDINGS: A total of approximately 2.9 liters of turbid, yellow fluid was removed.  IMPRESSION: Successful ultrasound guided therapeutic paracentesis yielding 2.9 liters of ascites.  Read by: Rowe Robert ,P.A.-C.   Electronically Signed   By: Sandi Mariscal M.D.   On: 06/10/2013 13:02   US Paracentesis  06/05/2013   CLINICAL DATA:  Cirrhosis, recurrent ascites  EXAM: ULTRASOUND GUIDED PARACENTESIS  TECHNIQUE: Survey ultrasound of the abdomen was performed and an appropriate skin entry site in the RUQ abdomen was selected. Skin site was marked, prepped with Betadine, and draped in usual sterile fashion, and infiltrated locally with 1% lidocaine. A 5 French multisidehole Yueh sheath needle was advanced into the peritoneal space until fluid could be aspirated. The sheath was advanced and the needle  removed. 4.5 liters of milky yellow colored ascites were aspirated. No immediate complication.  IMPRESSION: Technically successful ultrasound guided paracentesis, removing 4.5 liters of ascites.  Read By:  Tsosie Billing PA-C    Electronically Signed   By: Maryclare Bean M.D.   On: 06/05/2013 16:08   US Paracentesis  05/22/2013   CLINICAL DATA:  Cirrhosis, recurrent ascites  EXAM: ULTRASOUND GUIDED PARACENTESIS  TECHNIQUE: Survey ultrasound of the abdomen was performed and an appropriate skin entry site in the RLQ abdomen was selected. Skin site was marked, prepped with Betadine, and draped in usual sterile fashion, and infiltrated locally with 1% lidocaine. A 5 French multi-sidehole Yueh sheath needle was advanced into the peritoneal space until fluid could be aspirated. The sheath was advanced and the needle removed. 9.5 liters of milky yellow/light brown colorascites were aspirated. No immediate complication.  IMPRESSION: Technically successful ultrasound guided paracentesis, removing 9.5 liters of ascites.  Read By:  Tsosie Billing PA-C   Electronically Signed   By: Markus Daft M.D.   On: 05/22/2013 11:54    Scheduled Meds: . ciprofloxacin  500 mg Oral Daily  . enoxaparin (LOVENOX) injection  30 mg Subcutaneous Q24H  . feeding supplement (GLUCERNA SHAKE)  237 mL Oral BID BM  . insulin aspart  0-20 Units Subcutaneous TID WC  . insulin aspart  0-5 Units Subcutaneous QHS  . insulin glargine  30 Units Subcutaneous Daily  . lactose free nutrition  237 mL Oral Daily  . lactulose  10 g Oral BID  . linagliptin  5 mg Oral Daily  . pantoprazole  40 mg Oral BID AC  . rifaximin  550 mg Oral BID  . sodium bicarbonate  1,300 mg Oral TID  . sodium chloride  10-40 mL Intracatheter Q12H  . sodium chloride  2 g Oral BID WC  . sodium polystyrene  15 g Oral Once   Continuous Infusions:    Time spent: 35 minutes with > 50% of time discussing current diagnostic test results, clinical impression and plan of care, including discussion of risk/benefit of peritoneal catheter and coordination of care with GI and IR.     LOS: 11 days   [facility] Pager [phone].   *Please note that the hospitalists switch teams  on Wednesdays. Please call the flow manager at [phone] if you are having difficulty reaching the hospitalist taking care of this patient as she can update you and provide the most up-to-date pager number of provider caring for the patient. If 8PM-8AM, please contact night-coverage at [email or web address], password [facility]  06/23/2013, 5:04 PM

## 2013-06-24 ENCOUNTER — Ambulatory Visit (HOSPITAL_COMMUNITY)
Admission: RE | Admit: 2013-06-24 | Discharge: 2013-06-24 | Disposition: A | Discharge status: Home / Self Care | Payer: Medicare Other | Source: Ambulatory Visit | Attending: Gastroenterology | Admitting: Gastroenterology

## 2013-06-24 ENCOUNTER — Inpatient Hospital Stay (HOSPITAL_COMMUNITY)

## 2013-06-24 ENCOUNTER — Encounter (HOSPITAL_COMMUNITY): Admission: RE | Admit: 2013-06-24 | Payer: Medicare Other | Source: Ambulatory Visit

## 2013-06-24 DIAGNOSIS — J189 Pneumonia, unspecified organism: Secondary | ICD-10-CM | POA: Diagnosis present

## 2013-06-24 LAB — GLUCOSE, CAPILLARY
GLUCOSE-CAPILLARY: 192 mg/dL — AB (ref 70–99)
Glucose-Capillary: 188 mg/dL — ABNORMAL HIGH (ref 70–99)
Glucose-Capillary: 177 mg/dL — ABNORMAL HIGH (ref 70–99)

## 2013-06-24 LAB — CBC
HEMATOCRIT: 39.3 % (ref 39.0–52.0)
Hemoglobin: 13.5 g/dL (ref 13.0–17.0)
MCH: 32.1 pg (ref 26.0–34.0)
MCHC: 34.4 g/dL (ref 30.0–36.0)
MCV: 93.3 fL (ref 78.0–100.0)
Platelets: 102 10*3/uL — ABNORMAL LOW (ref 150–400)
RBC: 4.21 MIL/uL — ABNORMAL LOW (ref 4.22–5.81)
RDW: 17.9 % — ABNORMAL HIGH (ref 11.5–15.5)
WBC: 13.6 10*3/uL — AB (ref 4.0–10.5)

## 2013-06-24 LAB — BASIC METABOLIC PANEL
BUN: 118 mg/dL — AB (ref 6–23)
CHLORIDE: 101 meq/L (ref 96–112)
CO2: 16 meq/L — AB (ref 19–32)
Calcium: 8.8 mg/dL (ref 8.4–10.5)
Creatinine, Ser: 3.78 mg/dL — ABNORMAL HIGH (ref 0.50–1.35)
GFR calc Af Amer: 17 mL/min — ABNORMAL LOW (ref >90)
GFR calc non Af Amer: 14 mL/min — ABNORMAL LOW (ref >90)
Glucose, Bld: 219 mg/dL — ABNORMAL HIGH (ref 70–99)
Potassium: 4.8 mEq/L (ref 3.7–5.3)
Sodium: 134 mEq/L — ABNORMAL LOW (ref 137–147)

## 2013-06-24 LAB — PROTIME-INR
INR: 1.47 (ref 0.00–1.49)
PROTHROMBIN TIME: 17.4 s — AB (ref 11.6–15.2)

## 2013-06-24 LAB — APTT: aPTT: 36 seconds (ref 24–37)

## 2013-06-24 LAB — OSMOLALITY, URINE: Osmolality, Ur: 401 mOsm/kg (ref 390–1090)

## 2013-06-24 MED ORDER — INSULIN ASPART 100 UNIT/ML ~~LOC~~ SOLN: 0.0000 [IU] | Freq: Three times a day (TID) | SUBCUTANEOUS | Status: AC

## 2013-06-24 MED ORDER — SODIUM BICARBONATE 650 MG PO TABS: 1300.0000 mg | ORAL_TABLET | Freq: Three times a day (TID) | ORAL | Status: AC

## 2013-06-24 MED ORDER — CIPROFLOXACIN HCL 500 MG PO TABS: 500.0000 mg | ORAL_TABLET | Freq: Every day | ORAL | Status: AC

## 2013-06-24 MED ORDER — MIDAZOLAM HCL 2 MG/2ML IJ SOLN
INTRAMUSCULAR | Status: AC
  Filled 2013-06-24: qty 6

## 2013-06-24 MED ORDER — SODIUM CHLORIDE 1 G PO TABS: 2.0000 g | ORAL_TABLET | Freq: Two times a day (BID) | ORAL | Status: AC

## 2013-06-24 MED ORDER — ENOXAPARIN SODIUM 30 MG/0.3ML ~~LOC~~ SOLN
30.0000 mg | SUBCUTANEOUS | Status: DC
  Filled 2013-06-24: qty 0.3

## 2013-06-24 MED ORDER — LACTULOSE 10 GM/15ML PO SOLN: 10.0000 g | Freq: Two times a day (BID) | ORAL | Status: AC

## 2013-06-24 MED ORDER — INSULIN ASPART 100 UNIT/ML ~~LOC~~ SOLN: 0.0000 [IU] | Freq: Every day | SUBCUTANEOUS | Status: AC

## 2013-06-24 MED ORDER — INSULIN GLARGINE 100 UNIT/ML ~~LOC~~ SOLN: 30.0000 [IU] | Freq: Every day | SUBCUTANEOUS | Status: AC

## 2013-06-24 MED ORDER — CIPROFLOXACIN IN D5W 400 MG/200ML IV SOLN
400.0000 mg | Freq: Once | INTRAVENOUS | Status: AC
  Administered 2013-06-24: 16:00:00 400 mg via INTRAVENOUS
  Filled 2013-06-24: qty 200

## 2013-06-24 MED ORDER — ALBUTEROL SULFATE (2.5 MG/3ML) 0.083% IN NEBU: 2.5000 mg | INHALATION_SOLUTION | RESPIRATORY_TRACT | Status: AC | PRN

## 2013-06-24 MED ORDER — ONDANSETRON 4 MG PO TBDP: 4.0000 mg | ORAL_TABLET | Freq: Three times a day (TID) | ORAL | Status: AC | PRN

## 2013-06-24 MED ORDER — MENTHOL 3 MG MT LOZG: 1.0000 | LOZENGE | OROMUCOSAL | Status: AC | PRN

## 2013-06-24 MED ORDER — LIDOCAINE HCL 1 % IJ SOLN
INTRAMUSCULAR | Status: AC
  Filled 2013-06-24: qty 20

## 2013-06-24 MED ORDER — RIFAXIMIN 550 MG PO TABS: 550.0000 mg | ORAL_TABLET | Freq: Two times a day (BID) | ORAL | Status: AC

## 2013-06-24 MED ORDER — FENTANYL CITRATE 0.05 MG/ML IJ SOLN
INTRAMUSCULAR | Status: AC
  Filled 2013-06-24: qty 6

## 2013-06-24 MED ORDER — LORAZEPAM 0.5 MG PO TABS: 0.5000 mg | ORAL_TABLET | Freq: Four times a day (QID) | ORAL | Status: AC | PRN

## 2013-06-24 MED ORDER — GLUCERNA SHAKE PO LIQD: 237.0000 mL | Freq: Two times a day (BID) | ORAL | Status: AC

## 2013-06-24 NOTE — Progress Notes (Signed)
Pt currently NPO for IR procedure.  Unable to take daily dose of PO Cipro.  Received one time order of IV Cipro on call to IR prior to procedure.  Order received to hold today's dose of Lovenox, pharmacy called to reschedule.

## 2013-06-24 NOTE — Progress Notes (Signed)
Room WL 1618 - Jerry BeadyFranklin [name] -[facility] RN [facility]-Jerry DoloresLester, RN   Pt seen at bedside along with wife and daughter; wife and patient state plan is for patient to have peritoneal catheter placement later today; and 'if all goes well' [facility]; pt reports he's 'not feeling great' but unable to state specifics; staff RN Jerry [name] to check with pt and alerted on writer's visit pt's indwelling foley was clamped ? need for specimen Wife daughter and patient voiced no other concerns/issues on this visit Per  Dr Buccini's note today he discussed code status recommending No Code Blue and pt/wife seem to be in agreement he has encouraged pt/wife to talk with primary physician  This is a related admission to [facility] diagnosis of chronic liver disease.  Please call [facility] @ [phone]- ask for RN Liaison or after hours,ask for on-call RN with any hospice needs.  Thank you Jerry DavidMargie Ronda Rajkumar, RN [phone]:[phone]

## 2013-06-24 NOTE — Progress Notes (Signed)
CSW assisting with d/c planning. [facility]. CSW will assist with d/c planning in the am.  Cori RazorJamie Saharah Sherrow LCSW [phone]

## 2013-06-24 NOTE — Progress Notes (Signed)
Mr. Jerry [name] remains in the hospital seeking treatment including paracentesis and management of symptoms related to his liver failure. Goals are established. [facility]. Please re-consult if we can be of further assistance.  Jerry MaltaElizabeth Sabiha Sura, DO Palliative Medicine

## 2013-06-24 NOTE — Discharge Instructions (Signed)
Ascites °Ascites is a gathering of fluid in the belly (abdomen). This is most often caused by liver disease. It may also be caused by a number of other less common problems. It causes a ballooning out (distension) of the abdomen. °CAUSES  °Scarring of the liver (cirrhosis) is the most common cause of ascites. Other causes include: °· Infection or inflammation in the abdomen. °· Cancer in the abdomen. °· Heart failure. °· Certain forms of kidney failure (nephritic syndrome). °· Inflammation of the pancreas. °· Clots in the veins of the liver. °SYMPTOMS  °In the early stages of ascites, you may not have any symptoms. The main symptom of ascites is a sense of abdominal bloating. This is due to the presence of fluid. This may also cause an increase in abdominal or waist size. People with this condition can develop swelling in the legs, and men can develop a swollen scrotum. When there is a lot of fluid, it may be hard to breath. Stretching of the abdomen by fluid can be painful. °DIAGNOSIS  °Certain features of your medical history, such as a history of liver disease and of an enlarging abdomen, can suggest the presence of ascites. The diagnosis of ascites can be made on physical exam by your caregiver. An abdominal ultrasound examination can confirm that ascites is present, and estimate the amount of fluid. °Once ascites is confirmed, it is important to determine its cause. Again, a history of one of the conditions listed in "CAUSES" provides a strong clue. A physical exam is important, and blood and X-ray tests may be needed. During a procedure called paracentesis, a sample of fluid is removed from the abdomen. This can determine certain key features about the fluid, such as whether or not infection or cancer is present. Your caregiver will determine if a paracentesis is necessary. They will describe the procedure to you. °PREVENTION  °Ascites is a complication of other conditions. Therefore to prevent ascites, you  must seek treatment for any significant health conditions you have. Once ascites is present, careful attention to fluid and salt intake may help prevent it from getting worse. If you have ascites, you should not drink alcohol. °PROGNOSIS  °The prognosis of ascites depends on the underlying disease. If the disease is reversible, such as with certain infections or with heart failure, then ascites may improve or disappear. When ascites is caused by cirrhosis, then it indicates that the liver disease has worsened, and further evaluation and treatment of the liver disease is needed. If your ascites is caused by cancer, then the success or failure of the cancer treatment will determine whether your ascites will improve or worsen. °RISKS AND COMPLICATIONS  °Ascites is likely to worsen if it is not properly diagnosed and treated. A large amount of ascites can cause pain and difficulty breathing. The main complication, besides worsening, is infection (called spontaneous bacterial peritonitis). This requires prompt treatment. °TREATMENT  °The treatment of ascites depends on its cause. When liver disease is your cause, medical management using water pills (diuretics) and decreasing salt intake is often effective. Ascites due to peritoneal inflammation or malignancy (cancer) alone does not respond to salt restriction and diuretics. Hospitalization is sometimes required. °If the treatment of ascites cannot be managed with medications, a number of other treatments are available. Your caregivers will help you decide which will work best for you. Some of these are: °· Removal of fluid from the abdomen (paracentesis). °· Fluid from the abdomen is passed into a vein (peritoneovenous shunting). °·   Liver transplantation.  Transjugular intrahepatic portosystemic stent shunt. HOME CARE INSTRUCTIONS  It is important to monitor body weight and the intake and output of fluids. Weigh yourself at the same time every day. Record your  weights. Fluid restriction may be necessary. It is also important to know your salt intake. The more salt you take in, the more fluid you will retain. Ninety percent of people with ascites respond to this approach.  Follow any directions for medicines carefully.  Follow up with your caregiver, as directed.  Report any changes in your health, especially any new or worsening symptoms.  If your ascites is from liver disease, avoid alcohol and other substances toxic to the liver. SEEK MEDICAL CARE IF:   Your weight increases more than a few pounds in a few days.  Your abdominal or waist size increases.  You develop swelling in your legs.  You had swelling and it worsens. SEEK IMMEDIATE MEDICAL CARE IF:   You develop a fever.  You develop new abdominal pain.  You develop difficulty breathing.  You develop confusion.  You have bleeding from the mouth, stomach, or rectum. MAKE SURE YOU:   Understand these instructions.  Will watch your condition.  Will get help right away if you are not doing well or get worse. Document Released: 06/05/2005 Document Revised: 08/28/2011 Document Reviewed: 01/04/2007 [facility] Patient Information 2014 [employer], [place].  Ascites Drainage Catheter Placement Care After Refer to this sheet in the next few weeks. These instructions provide you with information on caring for yourself after your procedure. Your caregiver may also give you more specific instructions. Your treatment has been planned according to current medical practices, but problems sometimes occur. Call your caregiver if you have any problems or questions after your procedure. HOME CARE INSTRUCTIONS  Rest for a few days while your skin is healing.  Only take over-the-counter or prescription medicines for pain, fever, or discomfort as directed by your caregiver.  Apply an antiseptic ointment and bandages (dressings) to the wound as directed by your caregiver.  Follow your caregiver's  dietary instructions. Your caregiver will help you to understand the foods, liquids, and amount of sodium that can increase or decrease fluid retention. You may be asked to eat a high protein diet following the procedure. You may be asked to avoid alcohol and restrict sodium intake.  Return to your caregiver for blood tests and other tests as directed.  See your caregiver for removal of stitches in 4 to 5 days, or as directed.  You may be asked to drain fluid at home. Follow your caregiver's instructions. Draining the fluid will help you to avoid pain and other symptoms.  Check your weight every day. SEEK MEDICAL CARE IF:  You have trouble caring for your catheter.  You develop chills.  You notice bleeding, skin irritation, drainage, redness, or pain around the insertion site.  You have abdominal pain, bloating, pressure, or cramping.  You notice that fluid in the catheter is cloudy.  You have other new symptoms.  You have questions or concerns. SEEK IMMEDIATE MEDICAL CARE IF:  Your abdominal pain does not go away or becomes severe.  You have a fever. MAKE SURE YOU:  Understand these instructions.  Will watch your condition.  Will get help right away if you are not doing well or get worse. Document Released: 05/25/2011 Document Revised: 08/28/2011 Document Reviewed: 05/25/2011 [facility] Patient Information 2014 [place], [place].

## 2013-06-24 NOTE — Discharge Summary (Addendum)
Physician Discharge Summary  Chesky Heyer ZOX:[id number] DOB: 11-30-1940 DOA: 06/12/2013  PCP: Thora Lance, MD  Admit date: 06/12/2013 Discharge date: Anticipated 06/25/2013  Recommendations for Outpatient Follow-up:  1. The patient is being discharged to [facility] for end of life care. 2. A tunneled peritoneal catheter was placed for comfort purposes.  These catheters have a high risk of infection/peritonitis, and therefore would recommend ongoing Cipro for prophylaxis. 3. Please drain peritoneal catheter PRN for re-accumulating ascites.   Discharge Diagnoses:  Principal Problem:    Acute respiratory failure secondary to CAP Active Problems:    Ascites    Acute on chronic renal failure    Transaminitis    Hyponatremia    NAFLD (nonalcoholic fatty liver disease)    Diabetes mellitus, type II    Hepatic encephalopathy    Protein-calorie malnutrition, severe    Pneumonia    Hyperkalemia    Leukocytosis, unspecified    Urinary retention    Metabolic acidosis   Discharge Condition: Terminal.  Diet recommendation: Carb modified.  History of present illness:  Jerry [name] is an 73 y.o. male with a PMH of cirrhosis of the liver secondary to [facility], recurrent accumulating ascites requiring frequent paracentesis, hypertension, hyperlipidemia, diabetes, stage II-3 chronic kidney disease with baseline creatinine 1.2-1.6, chronic anemia with baseline hemoglobin around 11 mg/dL who was admitted on 81/19/1478 with altered mental status. Upon initial evaluation, the patient was hyponatremic with elevated ammonia levels and evidence of pneumonia on chest x-ray.   Hospital Course by problem:  Principal Problem:  Acute respiratory failure secondary to community-acquired pneumonia  Initial chest x-ray showed pneumonia. He was initially treated with broad-spectrum antibiotics including vancomycin and cefepime which was later transitioned to oral Levaquin (completed  therapy on 06/17/13). Repeat chest x-ray showed improvement. Septic workup including urinary Legionella antigen studies and strep pneumonia antigen studies were negative, respiratory virus panel negative, blood cultures negative, sputum culture not representative of lower respiratory tract secretions. Continue supplemental oxygen and bronchodilator therapy as needed.  Active Problems:  Urinary retention  Continue foley.  Diarrhea  On Lactulose. C. Diff negative.  Ascites  Paracentesis done 06/16/2013 with 6.2 L withdrawn and 06/20/13 with 6.1 L withdrawn. Given 25 mg of albumin each time. Typically gets these done weekly. Spoke with Dr. Matthias Hughs about placement of a permanent peritoneal catheter, given poor prognosis and desire for comfort measures, will have this placed by IR (done 06/24/13). Will also start Cipro 500 mg daily for SBP prophylaxis.  Acute on chronic renal failure / metabolic acidosis / hyperkalemia  Thought to be prerenal versus hepatorenal syndrome. Did not respond to IV fluids. Nephrology consultation performed by Dr. Arlean Hopping on 06/16/2013. Baseline creatinine 1.2-1.6. Creatinine rising with worsening metabolic acidosis and hyperkalemia. Continue sodium bicarbonate tablets, hyperkalemia resolved with kayexalate.  Transaminitis / NAFLD (nonalcoholic fatty liver disease)  Secondary to NAFLD. Seen by Dr. Ladona Ridgel from the palliative care team on 06/17/2013. Patient now a DNR after a discussion with family by Dr. Matthias Hughs.  Hyponatremia  Multifactorial with cirrhosis physiology and volume depletion contributory. 3% saline initiated on 06/16/2013 under the direction of nephrology. Sodium steadily improving from a low of 114---> 125--->127--->128--->131--->129. D/C home on sodium chloride tabs. Diabetes mellitus, type II  Hemoglobin A1c 7.1% on 06/02/2013. Continue Tradjenta, Lantus 30 units daily, and resistant SSI 3 times a day.  Hepatic encephalopathy  Continue lactulose and Rifaximin.   Protein-calorie malnutrition, severe  Seen by dietitian. Continue supplements.  Leukocytosis, unspecified  Resolved with treatment of pneumonia.  Procedures:  Placement of tunneled peritoneal catheter by IR on 06/24/13.  Consultations:  Dr. Delano Metz, Neprhology  Dr. Julaine Fusi, Palliative Care  IR  Discharge Exam: Filed Vitals:   06/24/13 0454  BP: 92/56  Pulse: 91  Temp: 97.8 F (36.6 C)  Resp: 20   Filed Vitals:   06/23/13 0600 06/23/13 1511 06/23/13 2156 06/24/13 0454  BP: 130/76  104/69 92/56  Pulse: 102  94 91  Temp: 98.7 F (37.1 C) 98.3 F (36.8 C) 96.5 F (35.8 C) 97.8 F (36.6 C)  TempSrc: Oral Oral Axillary Oral  Resp: 20  20 20   Height:      Weight:    79.1 kg (174 lb 6.1 oz)  SpO2: 95%  96% 95%    Gen: NAD, weak Cardiovascular: Tachycardic, No M/R/G  Respiratory: Lungs CTAB  Gastrointestinal: Abdomen softly distended, NT, + BS  Extremities: No C/E/C    Discharge Instructions  Discharge Orders   Future Appointments Provider Department Dept Phone   07/01/2013 11:00 AM [facility] [phone]   07/01/2013 12:00 PM [facility] [phone]   Future Orders Complete By Expires   Call MD for:  severe uncontrolled pain  As directed    Call MD for:  temperature >100.4  As directed    Diet Carb Modified  As directed    Increase activity slowly  As directed        Medication List         albuterol (2.5 MG/3ML) 0.083% nebulizer solution  Commonly known as:  PROVENTIL  Take 3 mLs (2.5 mg total) by nebulization every 4 (four) hours as needed for wheezing or shortness of breath.     ciprofloxacin 500 MG tablet  Commonly known as:  CIPRO  Take 1 tablet (500 mg total) by mouth daily.     esomeprazole 20 MG capsule  Commonly known as:  NEXIUM  Take 1 capsule (20 mg total) by mouth every morning.     feeding supplement (GLUCERNA SHAKE) Liqd  Take 237 mLs by mouth 2 (two)  times daily between meals.     insulin aspart 100 UNIT/ML injection  Commonly known as:  novoLOG  Inject 0-20 Units into the skin 3 (three) times daily with meals.     insulin aspart 100 UNIT/ML injection  Commonly known as:  novoLOG  Inject 0-5 Units into the skin at bedtime.     insulin glargine 100 UNIT/ML injection  Commonly known as:  LANTUS  Inject 0.3 mLs (30 Units total) into the skin daily.     lactulose 10 GM/15ML solution  Commonly known as:  CHRONULAC  Take 15 mLs (10 g total) by mouth 2 (two) times daily.     LORazepam 0.5 MG tablet  Commonly known as:  ATIVAN  Take 1 tablet (0.5 mg total) by mouth every 6 (six) hours as needed for anxiety.     menthol-cetylpyridinium 3 MG lozenge  Commonly known as:  CEPACOL  Take 1 lozenge (3 mg total) by mouth as needed for sore throat.     omeprazole-sodium bicarbonate 40-1100 MG per capsule  Commonly known as:  ZEGERID  Take 1 capsule by mouth every evening.     ondansetron 4 MG disintegrating tablet  Commonly known as:  ZOFRAN ODT  Take 1 tablet (4 mg total) by mouth every 8 (eight) hours as needed for nausea or vomiting.     rifaximin 550 MG Tabs tablet  Commonly  known as:  XIFAXAN  Take 1 tablet (550 mg total) by mouth 2 (two) times daily.     sitaGLIPtin 50 MG tablet  Commonly known as:  JANUVIA  Take 1 tablet (50 mg total) by mouth daily.     sodium bicarbonate 650 MG tablet  Take 2 tablets (1,300 mg total) by mouth 3 (three) times daily.     sodium chloride 1 G tablet  Take 2 tablets (2 g total) by mouth 2 (two) times daily with a meal.           Follow-up Information   Schedule an appointment as soon as possible for a visit with Thora LanceEHINGER,ROBERT R, MD. (As needed)    Specialty:  Family Medicine   Contact information:   [address], [address] [place] [address] [phone]        The results of significant diagnostics from this hospitalization (including imaging, microbiology, ancillary  and laboratory) are listed below for reference.    Significant Diagnostic Studies: Dg Chest 2 View  06/14/2013   CLINICAL DATA:  Cough.  FOLLOW UP pneumonia.  EXAM: CHEST  2 VIEW  COMPARISON:  06/12/2013  FINDINGS: Mild basilar lung opacity is noted, similar on the left and mildly increased on the right, most consistent with atelectasis. No convincing pneumonia. Lung volumes are low accentuating the lung base opacity.  No pulmonary edema.  No pleural effusion or pneumothorax.  The heart, mediastinum and hila are unremarkable.  IMPRESSION: Mild lung base opacity as described most consistent with atelectasis.   Electronically Signed   By: Amie Portlandavid  Ormond M.D.   On: 06/14/2013 17:37   Dg Chest 2 View  06/12/2013   CLINICAL DATA:  Cough and nausea.  EXAM: CHEST  2 VIEW  COMPARISON:  Chest radiograph performed 06/02/2013  FINDINGS: The lungs are hypoexpanded. Mild left basilar opacity may reflect atelectasis or possibly mild pneumonia. There is no evidence of pleural effusion or pneumothorax.  The heart is normal in size; the mediastinal contour is within normal limits. No acute osseous abnormalities are seen.  IMPRESSION: Lungs hypoexpanded. Mild left basilar airspace opacity may reflect atelectasis or possibly mild pneumonia.   Electronically Signed   By: Roanna RaiderJeffery  Chang M.D.   On: 06/12/2013 04:30   Dg Chest 2 View  06/02/2013   CLINICAL DATA:  Altered mental still  EXAM: CHEST  2 VIEW  COMPARISON:  Chest x-ray from yesterday  FINDINGS: Low lung volumes persist, with bibasilar atelectasis. No edema, pneumothorax, or consolidation. Borderline cardiomegaly.  IMPRESSION: Persistently low lung volumes, with basilar atelectasis.   Electronically Signed   By: Tiburcio PeaJonathan  Watts M.D.   On: 06/02/2013 03:55   Dg Chest 2 View  06/01/2013   CLINICAL DATA:  Cough, dyspnea  EXAM: CHEST  2 VIEW  COMPARISON:  04/06/2013.  FINDINGS: Low lung volumes. Areas of increased density project within the lung bases. The osseous  structures are unremarkable. The cardiac silhouette is obscured.  IMPRESSION: Low lung volumes. Atelectasis versus infiltrate within the lung bases. Repeat evaluation with deeper inspiration recommended.   Electronically Signed   By: Salome HolmesHector  Cooper M.D.   On: 06/01/2013 12:44   Ct Head Wo Contrast  06/02/2013   CLINICAL DATA:  Expressive aphasia  EXAM: CT HEAD WITHOUT CONTRAST  TECHNIQUE: Contiguous axial images were obtained from the base of the skull through the vertex without intravenous contrast.  COMPARISON:  10/02/2010  FINDINGS: Skull and Sinuses:No significant abnormality.  Orbits: No acute abnormality.  Brain: No  evidence of acute abnormality, such as acute infarction, hemorrhage, hydrocephalus, or mass lesion/mass effect. Age appropriate cerebral volume loss.  IMPRESSION: No evidence of acute intracranial disease.   Electronically Signed   By: Tiburcio Pea M.D.   On: 06/02/2013 04:07   US Paracentesis  06/20/2013   CLINICAL DATA:  Cirrhosis, recurrent ascites  EXAM: ULTRASOUND GUIDED PARACENTESIS  TECHNIQUE: Survey ultrasound of the abdomen was performed and an appropriate skin entry site in the RLQ abdomen was selected. Skin site was marked, prepped with Betadine, and draped in usual sterile fashion, and infiltrated locally with 1% lidocaine. A 5 French multisidehole Yueh sheath needle was advanced into the peritoneal space until fluid could be aspirated. The sheath was advanced and the needle removed. 6.1 liters of cloudy yellow ascites were aspirated. No immediate complication.  IMPRESSION: Technically successful ultrasound guided paracentesis, removing 6.1 liters of ascites.  Read By:  Pattricia Boss PA-C   Electronically Signed   By: Oley Balm M.D.   On: 06/20/2013 14:34   US Paracentesis  06/16/2013   CLINICAL DATA:  Cirrhosis, recurrent ascites, request for therapeutic paracentesis.  EXAM: ULTRASOUND GUIDED PARACENTESIS  TECHNIQUE: Survey ultrasound of the abdomen was performed and  an appropriate skin entry site in the RLQ abdomen was selected. Skin site was marked, prepped with Betadine, and draped in usual sterile fashion, and infiltrated locally with 1% lidocaine. A 5 French multi-sidehole Yueh sheath needle was advanced into the peritoneal space until fluid could be aspirated. The sheath was advanced and the needle removed. 6.2 liters of cloudy yellow ascites were aspirated. No immediate complication.  IMPRESSION: Technically successful ultrasound guided paracentesis, removing 6.2 liters of ascites.  Read By:  Pattricia Boss PA-C   Electronically Signed   By: Richarda Overlie M.D.   On: 06/16/2013 15:11   US Paracentesis  06/10/2013   CLINICAL DATA:  Cirrhosis, recurrent ascites. Request is made for therapeutic paracentesis.  EXAM: ULTRASOUND GUIDED THERAPEUTIC PARACENTESIS  COMPARISON:  PREVIOUS PARACENTESIS ON 05/22/2013.  PROCEDURE: An ultrasound guided paracentesis was thoroughly discussed with the patient and questions answered. The benefits, risks, alternatives and complications were also discussed. The patient understands and wishes to proceed with the procedure. Written consent was obtained.  Ultrasound was performed to localize and mark an adequate pocket of fluid in the left mid to lower quadrant of the abdomen. The area was then prepped and draped in the normal sterile fashion. 1% Lidocaine was used for local anesthesia. Under ultrasound guidance a 19 gauge Yueh catheter was introduced. Paracentesis was performed. The catheter was removed and a dressing applied.  Complications: None.  FINDINGS: A total of approximately 2.9 liters of turbid, yellow fluid was removed.  IMPRESSION: Successful ultrasound guided therapeutic paracentesis yielding 2.9 liters of ascites.  Read by: Jeananne  ,P.A.-C.   Electronically Signed   By: Simonne Come M.D.   On: 06/10/2013 13:02   US Paracentesis  06/05/2013   CLINICAL DATA:  Cirrhosis, recurrent ascites  EXAM: ULTRASOUND GUIDED PARACENTESIS   TECHNIQUE: Survey ultrasound of the abdomen was performed and an appropriate skin entry site in the RUQ abdomen was selected. Skin site was marked, prepped with Betadine, and draped in usual sterile fashion, and infiltrated locally with 1% lidocaine. A 5 French multisidehole Yueh sheath needle was advanced into the peritoneal space until fluid could be aspirated. The sheath was advanced and the needle removed. 4.5 liters of milky yellow colored ascites were aspirated. No immediate complication.  IMPRESSION: Technically successful ultrasound guided paracentesis,  removing 4.5 liters of ascites.  Read By:  Pattricia Boss PA-C   Electronically Signed   By: Maryclare Bean M.D.   On: 06/05/2013 16:08    Labs:  Basic Metabolic Panel:  Recent Labs Lab 06/19/13 0415 06/19/13 0433 06/21/13 0610 06/22/13 0402 06/23/13 1230 06/24/13 0437  NA 127* 128* 131* 129* 131* 134*  K 4.5  --  4.6 4.9 5.4* 4.8  CL 97  --  101 98 98 101  CO2 18*  --  16* 15* 14* 16*  GLUCOSE 227*  --  157* 172* 229* 219*  BUN 62*  --  76* 86* 106* 118*  CREATININE 1.72*  --  2.25* 2.57* 3.41* 3.78*  CALCIUM 8.7  --  8.9 8.9 9.1 8.8   GFR Estimated Creatinine Clearance: 17.1 ml/min (by C-G formula based on Cr of 3.78). Liver Function Tests:  Recent Labs Lab 06/19/13 0415  AST 100*  ALT 90*  ALKPHOS 326*  BILITOT 1.4*  PROT 6.1  ALBUMIN 2.8*    Recent Labs Lab 06/19/13 0414 06/21/13 0610  AMMONIA 39 40   Coagulation profile  Recent Labs Lab 06/24/13 0437  INR 1.47    CBC:  Recent Labs Lab 06/19/13 0415 06/24/13 0437  WBC 11.7* 13.6*  HGB 15.5 13.5  HCT 44.1 39.3  MCV 91.7 93.3  PLT 119* 102*   CBG:  Recent Labs Lab 06/22/13 2213 06/23/13 0726 06/23/13 1231 06/23/13 1725 06/23/13 2153  GLUCAP 181* 151* 228* 188* 240*   Microbiology Recent Results (from the past 240 hour(s))  MRSA PCR SCREENING     Status: Abnormal   Collection Time    06/16/13  8:37 PM      Result Value Range Status    MRSA by PCR POSITIVE (*) NEGATIVE Final   Comment:            The GeneXpert MRSA Assay (FDA     approved for NASAL specimens     only), is one component of a     comprehensive MRSA colonization     surveillance program. It is not     intended to diagnose MRSA     infection nor to guide or     monitor treatment for     MRSA infections.     RESULT CALLED TO, READ BACK BY AND VERIFIED WITH:     ALDRIDGE,D/2W @2219  ON 06/16/13 BY KARCZEWSKI,S.  CLOSTRIDIUM DIFFICILE BY PCR     Status: None   Collection Time    06/17/13  5:52 AM      Result Value Range Status   C difficile by pcr NEGATIVE  NEGATIVE Final   Comment: Performed at [facility]  CULTURE, EXPECTORATED SPUTUM-ASSESSMENT     Status: None   Collection Time    06/17/13  4:11 PM      Result Value Range Status   Specimen Description SPU   Final   Special Requests NONE   Final   Sputum evaluation     Final   Value: MICROSCOPIC FINDINGS SUGGEST THAT THIS SPECIMEN IS NOT REPRESENTATIVE OF LOWER RESPIRATORY SECRETIONS. PLEASE RECOLLECT.     RESULTS CALLED TO Sudie Bailey [id number] @ 1836 BY J SCOTTON   Report Status 06/17/2013 FINAL   Final    Time coordinating discharge: 35 minutes.  Signed:  Jossalyn Forgione  Pager [phone] [facility] 06/24/2013, 3:25 PM

## 2013-06-24 NOTE — Progress Notes (Signed)
SOCIAL VISIT--Primary Gastroenterologist  Management of Hospitalist physicians and [facility] staff much appreciated.  Per discussion yesterday w/ Interventional Radiology and Dr. Darnelle Catalanama, I feel that placement of an abdominal drainage catheter is very appropriate at this point in the patient's clinical course.  I stopped by his room this morning and spoke with the patient and his wife about that.  I also recommended they agree to a No Code Blue status and they seem to be in agreement; I indicated that they should discuss that with pt's hospitalist physician, who is authorized to write the order.  Finally, if appropriate, I am certainly willing to serve as attending physician of record once [facility].  Please call me if I can be of any assistance in this patient's care.  Florencia Reasonsobert V. Abayomi Pattison, M.D. [phone]

## 2013-06-24 NOTE — Progress Notes (Signed)
PT Cancellation Note  Patient Details Name: Jerry [name] MRN: [id number] DOB: 05/03/1941   Cancelled Treatment:    Reason Eval/Treat Not Completed: Other (comment) Pt to have peritoneal catheter placement by IR today.  [facility].  If remains in acute, will check back on pt.    Aniyla Harling,KATHrine E 06/24/2013, 3:08 PM Zenovia JarredKati Brandy Kabat, PT, DPT 06/24/2013 Pager: [phone]

## 2013-06-24 NOTE — Progress Notes (Signed)
[facility] Sw note:  Sw met with Pt/wife/daughter. They reported plans are in place for Pt to have a drain placed today. [facility] ([facility]) will have a bed for Pt tomorrow 06/25/13 and family is agreeable to this plan. This Sw reviewed room/board charges for [facility] and wife completed the [facility] financial eligibility form for [facility]. Sw also provided education to wife about [facility] location/visiting hours and she verbalized understanding of this. Plan will be for Pt to go to [facility] 06/25/13 if medically able to do so at that time. This Sw provided active/reflective listening, education, emotional support during visit. Tonia Ghent, ACHP-SW [facility] # [phone]

## 2013-06-24 NOTE — Progress Notes (Signed)
Patient ID: Jerry [name], male   DOB: 26-Feb-1941, 73 y.o.   MRN: [id number] Request received for placement of a tunneled peritoneal catheter in pt with cirrhosis secondary to NASH, recurrent ascites requiring frequent large volume paracenteses, renal failure, prior encephalopathy, poor po intake and currently scheduled to go to [facility] on 1/7. Additional PMH as below. Exam: pt awake/alert at present; chest- sl dim BS rt base, otherwise clear; heart- RRR; abd- soft,mildly dist,+BS,NT; ext- no edema.  VSS; AF.   Past Medical History  Diagnosis Date  . Diabetes mellitus without complication   . Hypertension   . Cirrhosis, non-alcoholic     October 2014 diagnosed  . Hospice care patient     active with [facility] -call [phone] with any new encounters   Past Surgical History  Procedure Laterality Date  . Back surgery    . Hernia repair    Dg Chest 2 View  06/14/2013   CLINICAL DATA:  Cough.  FOLLOW UP pneumonia.  EXAM: CHEST  2 VIEW  COMPARISON:  06/12/2013  FINDINGS: Mild basilar lung opacity is noted, similar on the left and mildly increased on the right, most consistent with atelectasis. No convincing pneumonia. Lung volumes are low accentuating the lung base opacity.  No pulmonary edema.  No pleural effusion or pneumothorax.  The heart, mediastinum and hila are unremarkable.  IMPRESSION: Mild lung base opacity as described most consistent with atelectasis.   Electronically Signed   By: Amie Portland M.D.   On: 06/14/2013 17:37   Dg Chest 2 View  06/12/2013   CLINICAL DATA:  Cough and nausea.  EXAM: CHEST  2 VIEW  COMPARISON:  Chest radiograph performed 06/02/2013  FINDINGS: The lungs are hypoexpanded. Mild left basilar opacity may reflect atelectasis or possibly mild pneumonia. There is no evidence of pleural effusion or pneumothorax.  The heart is normal in size; the mediastinal contour is within normal limits. No acute osseous abnormalities are seen.  IMPRESSION: Lungs hypoexpanded. Mild left  basilar airspace opacity may reflect atelectasis or possibly mild pneumonia.   Electronically Signed   By: Roanna Raider M.D.   On: 06/12/2013 04:30   Dg Chest 2 View  06/02/2013   CLINICAL DATA:  Altered mental still  EXAM: CHEST  2 VIEW  COMPARISON:  Chest x-ray from yesterday  FINDINGS: Low lung volumes persist, with bibasilar atelectasis. No edema, pneumothorax, or consolidation. Borderline cardiomegaly.  IMPRESSION: Persistently low lung volumes, with basilar atelectasis.   Electronically Signed   By: Tiburcio Pea M.D.   On: 06/02/2013 03:55   Dg Chest 2 View  06/01/2013   CLINICAL DATA:  Cough, dyspnea  EXAM: CHEST  2 VIEW  COMPARISON:  04/06/2013.  FINDINGS: Low lung volumes. Areas of increased density project within the lung bases. The osseous structures are unremarkable. The cardiac silhouette is obscured.  IMPRESSION: Low lung volumes. Atelectasis versus infiltrate within the lung bases. Repeat evaluation with deeper inspiration recommended.   Electronically Signed   By: Salome Holmes M.D.   On: 06/01/2013 12:44   Ct Head Wo Contrast  06/02/2013   CLINICAL DATA:  Expressive aphasia  EXAM: CT HEAD WITHOUT CONTRAST  TECHNIQUE: Contiguous axial images were obtained from the base of the skull through the vertex without intravenous contrast.  COMPARISON:  10/02/2010  FINDINGS: Skull and Sinuses:No significant abnormality.  Orbits: No acute abnormality.  Brain: No evidence of acute abnormality, such as acute infarction, hemorrhage, hydrocephalus, or mass lesion/mass effect. Age appropriate cerebral volume loss.  IMPRESSION:  No evidence of acute intracranial disease.   Electronically Signed   By: Tiburcio Pea M.D.   On: 06/02/2013 04:07   US Paracentesis  06/20/2013   CLINICAL DATA:  Cirrhosis, recurrent ascites  EXAM: ULTRASOUND GUIDED PARACENTESIS  TECHNIQUE: Survey ultrasound of the abdomen was performed and an appropriate skin entry site in the RLQ abdomen was selected. Skin site was  marked, prepped with Betadine, and draped in usual sterile fashion, and infiltrated locally with 1% lidocaine. A 5 French multisidehole Yueh sheath needle was advanced into the peritoneal space until fluid could be aspirated. The sheath was advanced and the needle removed. 6.1 liters of cloudy yellow ascites were aspirated. No immediate complication.  IMPRESSION: Technically successful ultrasound guided paracentesis, removing 6.1 liters of ascites.  Read By:  Pattricia Boss PA-C   Electronically Signed   By: Oley Balm M.D.   On: 06/20/2013 14:34   US Paracentesis  06/16/2013   CLINICAL DATA:  Cirrhosis, recurrent ascites, request for therapeutic paracentesis.  EXAM: ULTRASOUND GUIDED PARACENTESIS  TECHNIQUE: Survey ultrasound of the abdomen was performed and an appropriate skin entry site in the RLQ abdomen was selected. Skin site was marked, prepped with Betadine, and draped in usual sterile fashion, and infiltrated locally with 1% lidocaine. A 5 French multi-sidehole Yueh sheath needle was advanced into the peritoneal space until fluid could be aspirated. The sheath was advanced and the needle removed. 6.2 liters of cloudy yellow ascites were aspirated. No immediate complication.  IMPRESSION: Technically successful ultrasound guided paracentesis, removing 6.2 liters of ascites.  Read By:  Pattricia Boss PA-C   Electronically Signed   By: Richarda Overlie M.D.   On: 06/16/2013 15:11   US Paracentesis  06/10/2013   CLINICAL DATA:  Cirrhosis, recurrent ascites. Request is made for therapeutic paracentesis.  EXAM: ULTRASOUND GUIDED THERAPEUTIC PARACENTESIS  COMPARISON:  PREVIOUS PARACENTESIS ON 05/22/2013.  PROCEDURE: An ultrasound guided paracentesis was thoroughly discussed with the patient and questions answered. The benefits, risks, alternatives and complications were also discussed. The patient understands and wishes to proceed with the procedure. Written consent was obtained.  Ultrasound was performed to  localize and mark an adequate pocket of fluid in the left mid to lower quadrant of the abdomen. The area was then prepped and draped in the normal sterile fashion. 1% Lidocaine was used for local anesthesia. Under ultrasound guidance a 19 gauge Yueh catheter was introduced. Paracentesis was performed. The catheter was removed and a dressing applied.  Complications: None.  FINDINGS: A total of approximately 2.9 liters of turbid, yellow fluid was removed.  IMPRESSION: Successful ultrasound guided therapeutic paracentesis yielding 2.9 liters of ascites.  Read by: Jeananne Rama ,P.A.-C.   Electronically Signed   By: Simonne Come M.D.   On: 06/10/2013 13:02   US Paracentesis  06/05/2013   CLINICAL DATA:  Cirrhosis, recurrent ascites  EXAM: ULTRASOUND GUIDED PARACENTESIS  TECHNIQUE: Survey ultrasound of the abdomen was performed and an appropriate skin entry site in the RUQ abdomen was selected. Skin site was marked, prepped with Betadine, and draped in usual sterile fashion, and infiltrated locally with 1% lidocaine. A 5 French multisidehole Yueh sheath needle was advanced into the peritoneal space until fluid could be aspirated. The sheath was advanced and the needle removed. 4.5 liters of milky yellow colored ascites were aspirated. No immediate complication.  IMPRESSION: Technically successful ultrasound guided paracentesis, removing 4.5 liters of ascites.  Read By:  Pattricia Boss PA-C   Electronically Signed   By: Art  Hoss M.D.   On: 06/05/2013 16:08  Results for orders placed during the hospital encounter of 06/12/13  CULTURE, BLOOD (ROUTINE X 2)      Result Value Range   Specimen Description BLOOD RIGHT HAND     Special Requests       Value: BOTTLES DRAWN AEROBIC AND ANAEROBIC 5CC BOTH BOTTLES   Culture  Setup Time       Value: 06/12/2013 14:52     Performed at [facility]   Culture       Value: NO GROWTH 5 DAYS     Performed at [facility]   Report Status 06/18/2013 FINAL     CULTURE, BLOOD (ROUTINE X 2)      Result Value Range   Specimen Description BLOOD LEFT ARM     Special Requests BOTTLES DRAWN AEROBIC ONLY 3CC     Culture  Setup Time       Value: 06/12/2013 14:52     Performed at [facility]   Culture       Value: NO GROWTH 5 DAYS     Performed at [facility]   Report Status 06/18/2013 FINAL    CULTURE, EXPECTORATED SPUTUM-ASSESSMENT      Result Value Range   Specimen Description SPU     Special Requests NONE     Sputum evaluation       Value: MICROSCOPIC FINDINGS SUGGEST THAT THIS SPECIMEN IS NOT REPRESENTATIVE OF LOWER RESPIRATORY SECRETIONS. PLEASE RECOLLECT.     RESULTS CALLED TO Sudie Bailey [id number] @ 1836 BY J SCOTTON   Report Status 06/17/2013 FINAL    RESPIRATORY VIRUS PANEL      Result Value Range   Source - RVPAN NOSE     Respiratory Syncytial Virus A NOT DETECTED     Respiratory Syncytial Virus B NOT DETECTED     Influenza A NOT DETECTED     Influenza B NOT DETECTED     Parainfluenza 1 NOT DETECTED     Parainfluenza 2 NOT DETECTED     Parainfluenza 3 NOT DETECTED     Metapneumovirus NOT DETECTED     Rhinovirus NOT DETECTED     Adenovirus NOT DETECTED     Influenza A H1 NOT DETECTED     Influenza A H3 NOT DETECTED    CLOSTRIDIUM DIFFICILE BY PCR      Result Value Range   C difficile by pcr NEGATIVE  NEGATIVE  MRSA PCR SCREENING      Result Value Range   MRSA by PCR POSITIVE (*) NEGATIVE  CBC WITH DIFFERENTIAL      Result Value Range   WBC 16.4 (*) 4.0 - 10.5 K/uL   RBC 4.58  4.22 - 5.81 MIL/uL   Hemoglobin 14.8  13.0 - 17.0 g/dL   HCT 04.5  40.9 - 81.1 %   MCV 88.4  78.0 - 100.0 fL   MCH 32.3  26.0 - 34.0 pg   MCHC 36.5 (*) 30.0 - 36.0 g/dL   RDW 91.4 (*) 78.2 - 95.6 %   Platelets 133 (*) 150 - 400 K/uL   Neutrophils Relative % 86 (*) 43 - 77 %   Neutro Abs 14.1 (*) 1.7 - 7.7 K/uL   Lymphocytes Relative 6 (*) 12 - 46 %   Lymphs Abs 1.0  0.7 - 4.0 K/uL   Monocytes Relative 8  3 - 12 %   Monocytes  Absolute 1.3 (*) 0.1 - 1.0 K/uL   Eosinophils Relative  0  0 - 5 %   Eosinophils Absolute 0.0  0.0 - 0.7 K/uL   Basophils Relative 0  0 - 1 %   Basophils Absolute 0.0  0.0 - 0.1 K/uL  COMPREHENSIVE METABOLIC PANEL      Result Value Range   Sodium 119 (*) 135 - 145 mEq/L   Potassium 5.5 (*) 3.5 - 5.1 mEq/L   Chloride 83 (*) 96 - 112 mEq/L   CO2 22  19 - 32 mEq/L   Glucose, Bld 274 (*) 70 - 99 mg/dL   BUN 56 (*) 6 - 23 mg/dL   Creatinine, Ser 1.61 (*) 0.50 - 1.35 mg/dL   Calcium 9.4  8.4 - 09.6 mg/dL   Total Protein 6.8  6.0 - 8.3 g/dL   Albumin 3.6  3.5 - 5.2 g/dL   AST 72 (*) 0 - 37 U/L   ALT 76 (*) 0 - 53 U/L   Alkaline Phosphatase 285 (*) 39 - 117 U/L   Total Bilirubin 2.6 (*) 0.3 - 1.2 mg/dL   GFR calc non Af Amer 43 (*) >90 mL/min   GFR calc Af Amer 50 (*) >90 mL/min  URINALYSIS, ROUTINE W REFLEX MICROSCOPIC      Result Value Range   Color, Urine AMBER (*) YELLOW   APPearance CLEAR  CLEAR   Specific Gravity, Urine 1.024  1.005 - 1.030   pH 5.5  5.0 - 8.0   Glucose, UA NEGATIVE  NEGATIVE mg/dL   Hgb urine dipstick NEGATIVE  NEGATIVE   Bilirubin Urine NEGATIVE  NEGATIVE   Ketones, ur NEGATIVE  NEGATIVE mg/dL   Protein, ur NEGATIVE  NEGATIVE mg/dL   Urobilinogen, UA 1.0  0.0 - 1.0 mg/dL   Nitrite NEGATIVE  NEGATIVE   Leukocytes, UA NEGATIVE  NEGATIVE  AMMONIA      Result Value Range   Ammonia 86 (*) 11 - 60 umol/L  HIV ANTIBODY (ROUTINE TESTING)      Result Value Range   HIV NON REACTIVE  NON REACTIVE  LEGIONELLA ANTIGEN, URINE      Result Value Range   Specimen Description URINE, CLEAN CATCH     Special Requests NONE     Legionella Antigen, Urine       Value: Negative for Legionella pneumophilia serogroup 1     Performed at [facility]   Report Status 06/13/2013 FINAL    STREP PNEUMONIAE URINARY ANTIGEN      Result Value Range   Strep Pneumo Urinary Antigen NEGATIVE  NEGATIVE  COMPREHENSIVE METABOLIC PANEL      Result Value Range   Sodium 116 (*) 135  - 145 mEq/L   Potassium 5.2 (*) 3.5 - 5.1 mEq/L   Chloride 82 (*) 96 - 112 mEq/L   CO2 21  19 - 32 mEq/L   Glucose, Bld 253 (*) 70 - 99 mg/dL   BUN 52 (*) 6 - 23 mg/dL   Creatinine, Ser 0.45 (*) 0.50 - 1.35 mg/dL   Calcium 9.1  8.4 - 40.9 mg/dL   Total Protein 6.5  6.0 - 8.3 g/dL   Albumin 3.3 (*) 3.5 - 5.2 g/dL   AST 60 (*) 0 - 37 U/L   ALT 69 (*) 0 - 53 U/L   Alkaline Phosphatase 259 (*) 39 - 117 U/L   Total Bilirubin 2.6 (*) 0.3 - 1.2 mg/dL   GFR calc non Af Amer 47 (*) >90 mL/min   GFR calc Af Amer 54 (*) >90 mL/min  MAGNESIUM  Result Value Range   Magnesium 3.0 (*) 1.5 - 2.5 mg/dL  PRO B NATRIURETIC PEPTIDE      Result Value Range   Pro B Natriuretic peptide (BNP) 447.5 (*) 0 - 125 pg/mL  BASIC METABOLIC PANEL      Result Value Range   Sodium 118 (*) 135 - 145 mEq/L   Potassium 4.8  3.5 - 5.1 mEq/L   Chloride 84 (*) 96 - 112 mEq/L   CO2 20  19 - 32 mEq/L   Glucose, Bld 331 (*) 70 - 99 mg/dL   BUN 55 (*) 6 - 23 mg/dL   Creatinine, Ser 1.61 (*) 0.50 - 1.35 mg/dL   Calcium 9.3  8.4 - 09.6 mg/dL   GFR calc non Af Amer 46 (*) >90 mL/min   GFR calc Af Amer 54 (*) >90 mL/min  GLUCOSE, CAPILLARY      Result Value Range   Glucose-Capillary 325 (*) 70 - 99 mg/dL  COMPREHENSIVE METABOLIC PANEL      Result Value Range   Sodium 119 (*) 135 - 145 mEq/L   Potassium 5.2 (*) 3.5 - 5.1 mEq/L   Chloride 85 (*) 96 - 112 mEq/L   CO2 22  19 - 32 mEq/L   Glucose, Bld 268 (*) 70 - 99 mg/dL   BUN 54 (*) 6 - 23 mg/dL   Creatinine, Ser 0.45 (*) 0.50 - 1.35 mg/dL   Calcium 9.2  8.4 - 40.9 mg/dL   Total Protein 6.2  6.0 - 8.3 g/dL   Albumin 3.2 (*) 3.5 - 5.2 g/dL   AST 64 (*) 0 - 37 U/L   ALT 69 (*) 0 - 53 U/L   Alkaline Phosphatase 262 (*) 39 - 117 U/L   Total Bilirubin 3.0 (*) 0.3 - 1.2 mg/dL   GFR calc non Af Amer 47 (*) >90 mL/min   GFR calc Af Amer 54 (*) >90 mL/min  CBC      Result Value Range   WBC 14.7 (*) 4.0 - 10.5 K/uL   RBC 4.56  4.22 - 5.81 MIL/uL   Hemoglobin 14.6   13.0 - 17.0 g/dL   HCT 81.1  91.4 - 78.2 %   MCV 88.6  78.0 - 100.0 fL   MCH 32.0  26.0 - 34.0 pg   MCHC 36.1 (*) 30.0 - 36.0 g/dL   RDW 95.6 (*) 21.3 - 08.6 %   Platelets 129 (*) 150 - 400 K/uL  AMMONIA      Result Value Range   Ammonia 73 (*) 11 - 60 umol/L  GLUCOSE, CAPILLARY      Result Value Range   Glucose-Capillary 264 (*) 70 - 99 mg/dL   Comment 1 Notify RN     Comment 2 Documented in Chart    GLUCOSE, CAPILLARY      Result Value Range   Glucose-Capillary 242 (*) 70 - 99 mg/dL  GLUCOSE, CAPILLARY      Result Value Range   Glucose-Capillary 282 (*) 70 - 99 mg/dL   Comment 1 Notify RN    GLUCOSE, CAPILLARY      Result Value Range   Glucose-Capillary 326 (*) 70 - 99 mg/dL  GLUCOSE, CAPILLARY      Result Value Range   Glucose-Capillary 287 (*) 70 - 99 mg/dL  BASIC METABOLIC PANEL      Result Value Range   Sodium 115 (*) 135 - 145 mEq/L   Potassium 4.3  3.5 - 5.1 mEq/L   Chloride 81 (*)  96 - 112 mEq/L   CO2 23  19 - 32 mEq/L   Glucose, Bld 292 (*) 70 - 99 mg/dL   BUN 53 (*) 6 - 23 mg/dL   Creatinine, Ser 4.091.41 (*) 0.50 - 1.35 mg/dL   Calcium 9.2  8.4 - 81.110.5 mg/dL   GFR calc non Af Amer 48 (*) >90 mL/min   GFR calc Af Amer 55 (*) >90 mL/min  CBC WITH DIFFERENTIAL      Result Value Range   WBC 13.8 (*) 4.0 - 10.5 K/uL   RBC 4.56  4.22 - 5.81 MIL/uL   Hemoglobin 14.6  13.0 - 17.0 g/dL   HCT 91.441.1  78.239.0 - 95.652.0 %   MCV 90.1  78.0 - 100.0 fL   MCH 32.0  26.0 - 34.0 pg   MCHC 35.5  30.0 - 36.0 g/dL   RDW 21.315.9 (*) 08.611.5 - 57.815.5 %   Platelets 137 (*) 150 - 400 K/uL   Neutrophils Relative % 76  43 - 77 %   Neutro Abs 10.5 (*) 1.7 - 7.7 K/uL   Lymphocytes Relative 15  12 - 46 %   Lymphs Abs 2.1  0.7 - 4.0 K/uL   Monocytes Relative 8  3 - 12 %   Monocytes Absolute 1.1 (*) 0.1 - 1.0 K/uL   Eosinophils Relative 1  0 - 5 %   Eosinophils Absolute 0.1  0.0 - 0.7 K/uL   Basophils Relative 0  0 - 1 %   Basophils Absolute 0.0  0.0 - 0.1 K/uL  GLUCOSE, CAPILLARY      Result  Value Range   Glucose-Capillary 214 (*) 70 - 99 mg/dL  OSMOLALITY      Result Value Range   Osmolality 289  275 - 300 mOsm/kg  OSMOLALITY, URINE      Result Value Range   Osmolality, Ur 565  390 - 1090 mOsm/kg  SODIUM, URINE, RANDOM      Result Value Range   Sodium, Ur <15    CREATININE, URINE, RANDOM      Result Value Range   Creatinine, Urine 157.4    GLUCOSE, CAPILLARY      Result Value Range   Glucose-Capillary 297 (*) 70 - 99 mg/dL  VANCOMYCIN, TROUGH      Result Value Range   Vancomycin Tr 26.3 (*) 10.0 - 20.0 ug/mL  BASIC METABOLIC PANEL      Result Value Range   Sodium 114 (*) 135 - 145 mEq/L   Potassium 4.2  3.5 - 5.1 mEq/L   Chloride 80 (*) 96 - 112 mEq/L   CO2 21  19 - 32 mEq/L   Glucose, Bld 229 (*) 70 - 99 mg/dL   BUN 57 (*) 6 - 23 mg/dL   Creatinine, Ser 4.691.54 (*) 0.50 - 1.35 mg/dL   Calcium 8.9  8.4 - 62.910.5 mg/dL   GFR calc non Af Amer 43 (*) >90 mL/min   GFR calc Af Amer 50 (*) >90 mL/min  CBC      Result Value Range   WBC 14.6 (*) 4.0 - 10.5 K/uL   RBC 4.58  4.22 - 5.81 MIL/uL   Hemoglobin 14.5  13.0 - 17.0 g/dL   HCT 52.840.8  41.339.0 - 24.452.0 %   MCV 89.1  78.0 - 100.0 fL   MCH 31.7  26.0 - 34.0 pg   MCHC 35.5  30.0 - 36.0 g/dL   RDW 01.015.7 (*) 27.211.5 - 53.615.5 %   Platelets 142 (*)  150 - 400 K/uL  GLUCOSE, CAPILLARY      Result Value Range   Glucose-Capillary 269 (*) 70 - 99 mg/dL  GLUCOSE, CAPILLARY      Result Value Range   Glucose-Capillary 293 (*) 70 - 99 mg/dL  GLUCOSE, CAPILLARY      Result Value Range   Glucose-Capillary 345 (*) 70 - 99 mg/dL   Comment 1 Notify RN     Comment 2 Documented in Chart    OSMOLALITY      Result Value Range   Osmolality 279  275 - 300 mOsm/kg  SODIUM      Result Value Range   Sodium 116 (*) 135 - 145 mEq/L  GLUCOSE, CAPILLARY      Result Value Range   Glucose-Capillary 389 (*) 70 - 99 mg/dL   Comment 1 Documented in Chart     Comment 2 Notify RN    GLUCOSE, CAPILLARY      Result Value Range   Glucose-Capillary 228  (*) 70 - 99 mg/dL  GLUCOSE, CAPILLARY      Result Value Range   Glucose-Capillary 191 (*) 70 - 99 mg/dL  BASIC METABOLIC PANEL      Result Value Range   Sodium 114 (*) 135 - 145 mEq/L   Potassium 4.6  3.5 - 5.1 mEq/L   Chloride 82 (*) 96 - 112 mEq/L   CO2 19  19 - 32 mEq/L   Glucose, Bld 218 (*) 70 - 99 mg/dL   BUN 61 (*) 6 - 23 mg/dL   Creatinine, Ser 1.61 (*) 0.50 - 1.35 mg/dL   Calcium 8.8  8.4 - 09.6 mg/dL   GFR calc non Af Amer 36 (*) >90 mL/min   GFR calc Af Amer 42 (*) >90 mL/min  GLUCOSE, CAPILLARY      Result Value Range   Glucose-Capillary 187 (*) 70 - 99 mg/dL  BASIC METABOLIC PANEL      Result Value Range   Sodium 116 (*) 135 - 145 mEq/L   Potassium 4.5  3.5 - 5.1 mEq/L   Chloride 83 (*) 96 - 112 mEq/L   CO2 19  19 - 32 mEq/L   Glucose, Bld 171 (*) 70 - 99 mg/dL   BUN 61 (*) 6 - 23 mg/dL   Creatinine, Ser 0.45 (*) 0.50 - 1.35 mg/dL   Calcium 9.1  8.4 - 40.9 mg/dL   GFR calc non Af Amer 36 (*) >90 mL/min   GFR calc Af Amer 42 (*) >90 mL/min  GLUCOSE, CAPILLARY      Result Value Range   Glucose-Capillary 152 (*) 70 - 99 mg/dL  SODIUM      Result Value Range   Sodium 116 (*) 135 - 145 mEq/L  SODIUM      Result Value Range   Sodium 121 (*) 137 - 147 mEq/L  BASIC METABOLIC PANEL      Result Value Range   Sodium 122 (*) 137 - 147 mEq/L   Potassium 4.6  3.7 - 5.3 mEq/L   Chloride 88 (*) 96 - 112 mEq/L   CO2 19  19 - 32 mEq/L   Glucose, Bld 142 (*) 70 - 99 mg/dL   BUN 64 (*) 6 - 23 mg/dL   Creatinine, Ser 8.11 (*) 0.50 - 1.35 mg/dL   Calcium 8.9  8.4 - 91.4 mg/dL   GFR calc non Af Amer 37 (*) >90 mL/min   GFR calc Af Amer 42 (*) >90 mL/min  CBC  Result Value Range   WBC 10.1  4.0 - 10.5 K/uL   RBC 4.19 (*) 4.22 - 5.81 MIL/uL   Hemoglobin 13.6  13.0 - 17.0 g/dL   HCT 74.2 (*) 59.5 - 63.8 %   MCV 88.8  78.0 - 100.0 fL   MCH 32.5  26.0 - 34.0 pg   MCHC 36.6 (*) 30.0 - 36.0 g/dL   RDW 75.6 (*) 43.3 - 29.5 %   Platelets 111 (*) 150 - 400 K/uL  SODIUM       Result Value Range   Sodium 120 (*) 137 - 147 mEq/L  SODIUM      Result Value Range   Sodium 119 (*) 137 - 147 mEq/L  SODIUM      Result Value Range   Sodium 119 (*) 137 - 147 mEq/L  SODIUM      Result Value Range   Sodium 121 (*) 137 - 147 mEq/L  GLUCOSE, CAPILLARY      Result Value Range   Glucose-Capillary 156 (*) 70 - 99 mg/dL  GLUCOSE, CAPILLARY      Result Value Range   Glucose-Capillary 125 (*) 70 - 99 mg/dL  SODIUM      Result Value Range   Sodium 122 (*) 137 - 147 mEq/L  GLUCOSE, CAPILLARY      Result Value Range   Glucose-Capillary 255 (*) 70 - 99 mg/dL  OSMOLALITY, URINE      Result Value Range   Osmolality, Ur 595  390 - 1090 mOsm/kg  OSMOLALITY, URINE      Result Value Range   Osmolality, Ur 607  390 - 1090 mOsm/kg  SODIUM      Result Value Range   Sodium 125 (*) 137 - 147 mEq/L  GLUCOSE, CAPILLARY      Result Value Range   Glucose-Capillary 149 (*) 70 - 99 mg/dL   Comment 1 Documented in Chart     Comment 2 Notify RN    SODIUM      Result Value Range   Sodium 123 (*) 137 - 147 mEq/L  SODIUM      Result Value Range   Sodium 123 (*) 137 - 147 mEq/L  GLUCOSE, CAPILLARY      Result Value Range   Glucose-Capillary 210 (*) 70 - 99 mg/dL  GLUCOSE, CAPILLARY      Result Value Range   Glucose-Capillary 179 (*) 70 - 99 mg/dL   Comment 1 Notify RN     Comment 2 Documented in Chart    GLUCOSE, CAPILLARY      Result Value Range   Glucose-Capillary 251 (*) 70 - 99 mg/dL   Comment 1 Documented in Chart     Comment 2 Notify RN    SODIUM      Result Value Range   Sodium 147  137 - 147 mEq/L  OSMOLALITY, URINE      Result Value Range   Osmolality, Ur 520  390 - 1090 mOsm/kg  AMMONIA      Result Value Range   Ammonia 39  11 - 60 umol/L  COMPREHENSIVE METABOLIC PANEL      Result Value Range   Sodium 127 (*) 137 - 147 mEq/L   Potassium 4.5  3.7 - 5.3 mEq/L   Chloride 97  96 - 112 mEq/L   CO2 18 (*) 19 - 32 mEq/L   Glucose, Bld 227 (*) 70 - 99  mg/dL   BUN 62 (*) 6 - 23 mg/dL   Creatinine,  Ser 1.72 (*) 0.50 - 1.35 mg/dL   Calcium 8.7  8.4 - 16.1 mg/dL   Total Protein 6.1  6.0 - 8.3 g/dL   Albumin 2.8 (*) 3.5 - 5.2 g/dL   AST 096 (*) 0 - 37 U/L   ALT 90 (*) 0 - 53 U/L   Alkaline Phosphatase 326 (*) 39 - 117 U/L   Total Bilirubin 1.4 (*) 0.3 - 1.2 mg/dL   GFR calc non Af Amer 37 (*) >90 mL/min   GFR calc Af Amer 43 (*) >90 mL/min  CBC      Result Value Range   WBC 11.7 (*) 4.0 - 10.5 K/uL   RBC 4.81  4.22 - 5.81 MIL/uL   Hemoglobin 15.5  13.0 - 17.0 g/dL   HCT 04.5  40.9 - 81.1 %   MCV 91.7  78.0 - 100.0 fL   MCH 32.2  26.0 - 34.0 pg   MCHC 35.1  30.0 - 36.0 g/dL   RDW 91.4 (*) 78.2 - 95.6 %   Platelets 119 (*) 150 - 400 K/uL  GLUCOSE, CAPILLARY      Result Value Range   Glucose-Capillary 220 (*) 70 - 99 mg/dL   Comment 1 Documented in Chart     Comment 2 Notify RN    SODIUM      Result Value Range   Sodium 125 (*) 137 - 147 mEq/L  SODIUM      Result Value Range   Sodium 128 (*) 137 - 147 mEq/L  GLUCOSE, CAPILLARY      Result Value Range   Glucose-Capillary 321 (*) 70 - 99 mg/dL   Comment 1 Notify RN    GLUCOSE, CAPILLARY      Result Value Range   Glucose-Capillary 184 (*) 70 - 99 mg/dL   Comment 1 Documented in Chart     Comment 2 Notify RN    GLUCOSE, CAPILLARY      Result Value Range   Glucose-Capillary 275 (*) 70 - 99 mg/dL   Comment 1 Documented in Chart     Comment 2 Notify RN    GLUCOSE, CAPILLARY      Result Value Range   Glucose-Capillary 182 (*) 70 - 99 mg/dL  GLUCOSE, CAPILLARY      Result Value Range   Glucose-Capillary 250 (*) 70 - 99 mg/dL   Comment 1 Documented in Chart     Comment 2 Notify RN    GLUCOSE, CAPILLARY      Result Value Range   Glucose-Capillary 190 (*) 70 - 99 mg/dL  GLUCOSE, CAPILLARY      Result Value Range   Glucose-Capillary 211 (*) 70 - 99 mg/dL  AMMONIA      Result Value Range   Ammonia 40  11 - 60 umol/L  BASIC METABOLIC PANEL      Result Value Range    Sodium 131 (*) 137 - 147 mEq/L   Potassium 4.6  3.7 - 5.3 mEq/L   Chloride 101  96 - 112 mEq/L   CO2 16 (*) 19 - 32 mEq/L   Glucose, Bld 157 (*) 70 - 99 mg/dL   BUN 76 (*) 6 - 23 mg/dL   Creatinine, Ser 2.13 (*) 0.50 - 1.35 mg/dL   Calcium 8.9  8.4 - 08.6 mg/dL   GFR calc non Af Amer 27 (*) >90 mL/min   GFR calc Af Amer 31 (*) >90 mL/min  GLUCOSE, CAPILLARY      Result Value Range   Glucose-Capillary  268 (*) 70 - 99 mg/dL  GLUCOSE, CAPILLARY      Result Value Range   Glucose-Capillary 158 (*) 70 - 99 mg/dL  GLUCOSE, CAPILLARY      Result Value Range   Glucose-Capillary 147 (*) 70 - 99 mg/dL  GLUCOSE, CAPILLARY      Result Value Range   Glucose-Capillary 255 (*) 70 - 99 mg/dL  OSMOLALITY, URINE      Result Value Range   Osmolality, Ur 432  390 - 1090 mOsm/kg  BASIC METABOLIC PANEL      Result Value Range   Sodium 129 (*) 137 - 147 mEq/L   Potassium 4.9  3.7 - 5.3 mEq/L   Chloride 98  96 - 112 mEq/L   CO2 15 (*) 19 - 32 mEq/L   Glucose, Bld 172 (*) 70 - 99 mg/dL   BUN 86 (*) 6 - 23 mg/dL   Creatinine, Ser 4.54 (*) 0.50 - 1.35 mg/dL   Calcium 8.9  8.4 - 09.8 mg/dL   GFR calc non Af Amer 23 (*) >90 mL/min   GFR calc Af Amer 27 (*) >90 mL/min  GLUCOSE, CAPILLARY      Result Value Range   Glucose-Capillary 218 (*) 70 - 99 mg/dL   Comment 1 Documented in Chart     Comment 2 Notify RN    GLUCOSE, CAPILLARY      Result Value Range   Glucose-Capillary 133 (*) 70 - 99 mg/dL   Comment 1 Documented in Chart     Comment 2 Notify RN    GLUCOSE, CAPILLARY      Result Value Range   Glucose-Capillary 137 (*) 70 - 99 mg/dL   Comment 1 Documented in Chart     Comment 2 Notify RN    GLUCOSE, CAPILLARY      Result Value Range   Glucose-Capillary 156 (*) 70 - 99 mg/dL  GLUCOSE, CAPILLARY      Result Value Range   Glucose-Capillary 185 (*) 70 - 99 mg/dL  GLUCOSE, CAPILLARY      Result Value Range   Glucose-Capillary 181 (*) 70 - 99 mg/dL   Comment 1 Documented in Chart      Comment 2 Notify RN    GLUCOSE, CAPILLARY      Result Value Range   Glucose-Capillary 151 (*) 70 - 99 mg/dL  BASIC METABOLIC PANEL      Result Value Range   Sodium 131 (*) 137 - 147 mEq/L   Potassium 5.4 (*) 3.7 - 5.3 mEq/L   Chloride 98  96 - 112 mEq/L   CO2 14 (*) 19 - 32 mEq/L   Glucose, Bld 229 (*) 70 - 99 mg/dL   BUN 119 (*) 6 - 23 mg/dL   Creatinine, Ser 1.47 (*) 0.50 - 1.35 mg/dL   Calcium 9.1  8.4 - 82.9 mg/dL   GFR calc non Af Amer 16 (*) >90 mL/min   GFR calc Af Amer 19 (*) >90 mL/min  OSMOLALITY, URINE      Result Value Range   Osmolality, Ur 401  390 - 1090 mOsm/kg  BASIC METABOLIC PANEL      Result Value Range   Sodium 134 (*) 137 - 147 mEq/L   Potassium 4.8  3.7 - 5.3 mEq/L   Chloride 101  96 - 112 mEq/L   CO2 16 (*) 19 - 32 mEq/L   Glucose, Bld 219 (*) 70 - 99 mg/dL   BUN 562 (*) 6 - 23 mg/dL  Creatinine, Ser 3.78 (*) 0.50 - 1.35 mg/dL   Calcium 8.8  8.4 - 16.1 mg/dL   GFR calc non Af Amer 14 (*) >90 mL/min   GFR calc Af Amer 17 (*) >90 mL/min  CBC      Result Value Range   WBC 13.6 (*) 4.0 - 10.5 K/uL   RBC 4.21 (*) 4.22 - 5.81 MIL/uL   Hemoglobin 13.5  13.0 - 17.0 g/dL   HCT 09.6  04.5 - 40.9 %   MCV 93.3  78.0 - 100.0 fL   MCH 32.1  26.0 - 34.0 pg   MCHC 34.4  30.0 - 36.0 g/dL   RDW 81.1 (*) 91.4 - 78.2 %   Platelets 102 (*) 150 - 400 K/uL  GLUCOSE, CAPILLARY      Result Value Range   Glucose-Capillary 228 (*) 70 - 99 mg/dL  GLUCOSE, CAPILLARY      Result Value Range   Glucose-Capillary 188 (*) 70 - 99 mg/dL  PROTIME-INR      Result Value Range   Prothrombin Time 17.4 (*) 11.6 - 15.2 seconds   INR 1.47  0.00 - 1.49  APTT      Result Value Range   aPTT 36  24 - 37 seconds  GLUCOSE, CAPILLARY      Result Value Range   Glucose-Capillary 240 (*) 70 - 99 mg/dL   A/P: Pt with hx of cirrhosis secondary to NASH, recurrent ascites requiring frequent large volume paracenteses, renal failure, prior encephalopathy, poor po intake and currently  scheduled to go to [facility] on 1/7. Case d/w Dr. Darnelle Catalan and indications for peritoneal drain placement reviewed with Dr. Jake Bathe). Details/risks of procedure, including but not limited to infection/sepsis, internal bleeding d/w pt /family with their understanding and consent.

## 2013-06-25 LAB — GLUCOSE, CAPILLARY
GLUCOSE-CAPILLARY: 185 mg/dL — AB (ref 70–99)
GLUCOSE-CAPILLARY: 207 mg/dL — AB (ref 70–99)

## 2013-06-25 LAB — OSMOLALITY, URINE: Osmolality, Ur: 375 mOsm/kg — ABNORMAL LOW (ref 390–1090)

## 2013-06-25 MED ORDER — HEPARIN SOD (PORK) LOCK FLUSH 100 UNIT/ML IV SOLN
250.0000 [IU] | INTRAVENOUS | Status: AC | PRN
  Administered 2013-06-25: 500 [IU]

## 2013-06-25 NOTE — Plan of Care (Signed)
SBP 75 - patient able to be awakened and able to take meds by mouth. Per RN patient prefers not to remain in the hospital so [facility]. Will leave PICC line and foley for comfort.  Junious SilkAllison Leilynn Pilat, ANP [phone]

## 2013-06-25 NOTE — Progress Notes (Signed)
Discharge summary sent to payer through [facility]  

## 2013-06-25 NOTE — Progress Notes (Signed)
S/p 'Aspira' peritoneal drainage catheter placement. Approx 4.4L removed after catheter placed yesterday. He is resting comfortably in bed this am. Denies much pain  BP 84/50  Pulse 89  Temp(Src) 95.4 F (35.2 C) (Axillary)  Resp 12  Ht '5\' 9"'  (1.753 m)  Wt 174 lb 6.1 oz (79.1 kg)  BMI 25.74 kg/m2  SpO2 96% Drain site clean, dressing dry. No hematoma or leakage.  Recurrent ascites from severe cirrhosis/liver disease. Plan for terminal care at [facility]. Bard-Aspira drain kit at bedside. Facility can call [facility] with drain issues---[phone].  Ascencion Dike PA-C Interventional Radiology 06/25/2013 8:15 AM

## 2013-06-25 NOTE — Progress Notes (Signed)
Room 1618 - Jerry [name] - [facility]-Jerry Crochet RN  [facility].  Pt is now DNR code thanks to Dr. Thayer Ohmhris Rama's discussion with pt and family.  Pt asleep while getting a bath today - wife questions if he is asleep or non responsive. She was very thankful for transfer to [facility] and very tearful as she states she realizes he is getting closer to end of life.  Patient's home medication list is on shadow chart.   Pt to be transferred [facility] today[name] and she is faxing all paperwork now and staff RN ready to call report.    Please call [facility] @ [phone]- ask for RN Liaison or after hours,ask for on-call RN with any hospice needs.   Thank you.  Jerry Boersose B Kamarah Bilotta, RN  [facility]  Hospice Liaison  [phone])

## 2013-07-01 ENCOUNTER — Ambulatory Visit (HOSPITAL_COMMUNITY): Admission: RE | Admit: 2013-07-01 | Payer: Medicare Other | Source: Ambulatory Visit

## 2013-07-01 ENCOUNTER — Encounter (HOSPITAL_COMMUNITY): Admission: RE | Admit: 2013-07-01 | Payer: Medicare Other | Source: Ambulatory Visit

## 2013-07-08 ENCOUNTER — Telehealth: Payer: Self-pay

## 2013-07-08 NOTE — Telephone Encounter (Signed)
Patient past away per Obituary in [employer] °

## 2013-07-20 DEATH — deceased

## 2014-10-07 IMAGING — CT CT ABD-PELV W/O CM
1 of 2 series · 15 of 32 positions shown, 19 images · non-contrast
Comparison: None.

CLINICAL DATA: Abdominal distention and diarrhea

EXAM:
CT ABDOMEN AND PELVIS WITHOUT CONTRAST
TECHNIQUE: Multidetector CT imaging of the abdomen and pelvis was performed
following the standard protocol without intravenous contrast.

[Series 2: abd/pel w/o · axial · non-contrast · 0.90mm/px · z∈[-517,-12]mm · 15 of 111 slices shown, 19 images]
[im 5/111  soft-tissue]
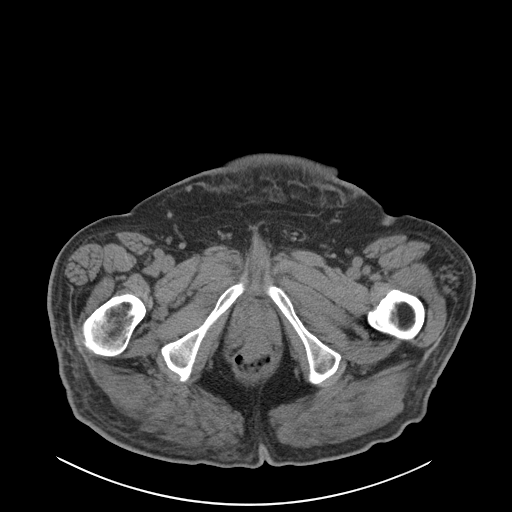
[im 5/111  bone]
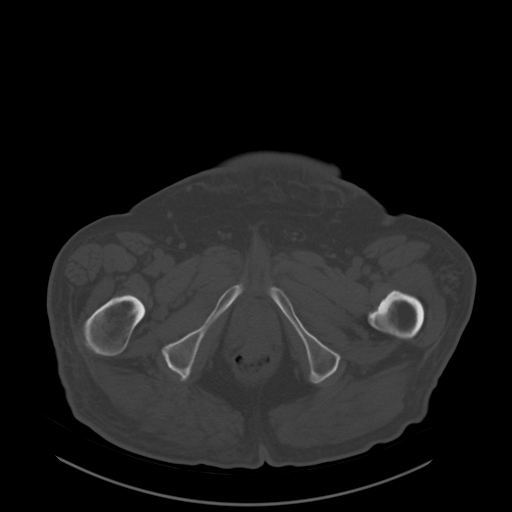
[im 14/111  soft-tissue]
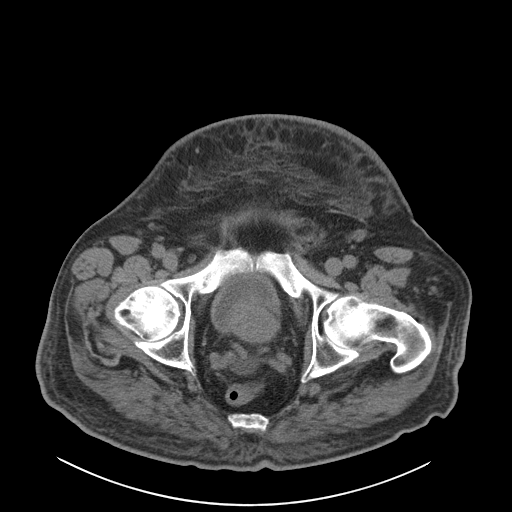
[im 23/111  soft-tissue]
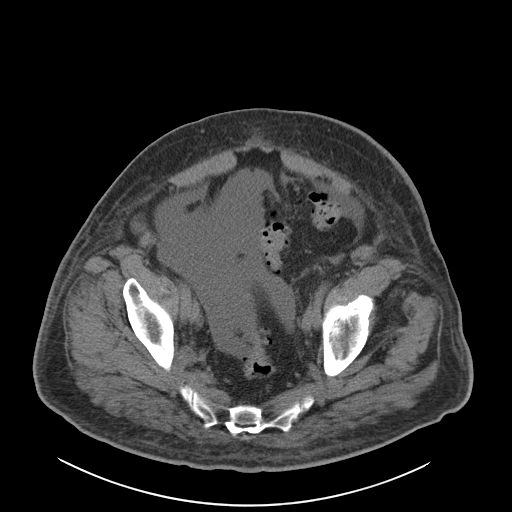
[im 31/111  soft-tissue]
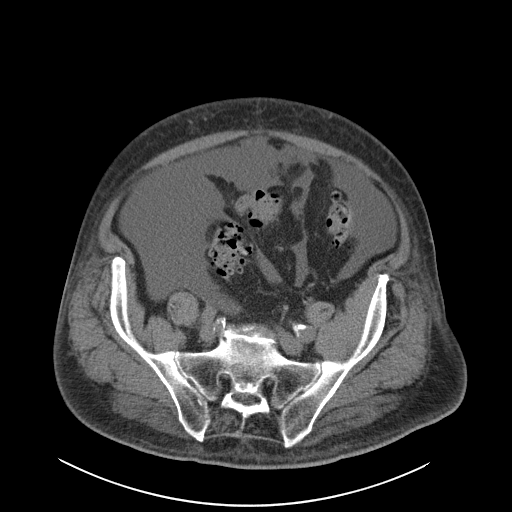
[im 40/111  soft-tissue]
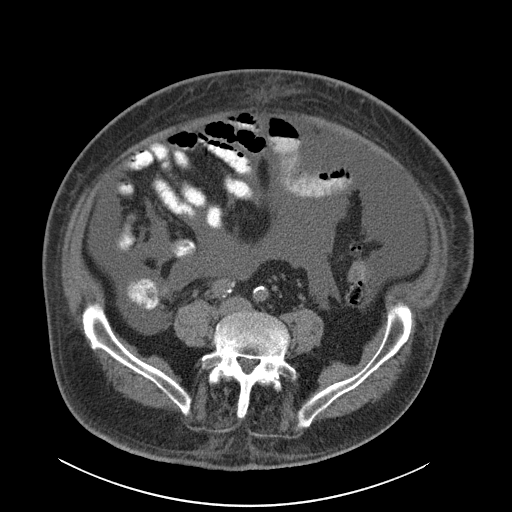
[im 49/111  soft-tissue]
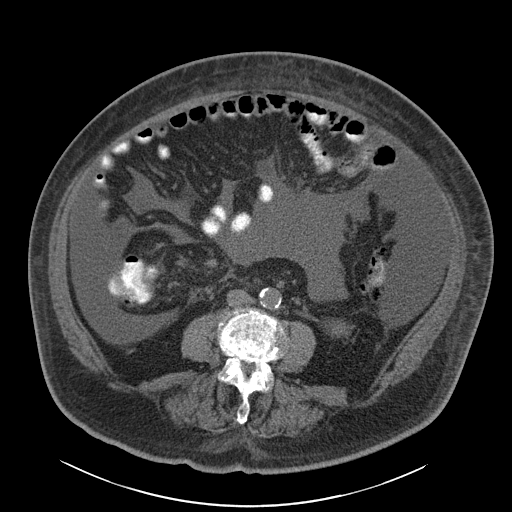
[im 58/111  soft-tissue]
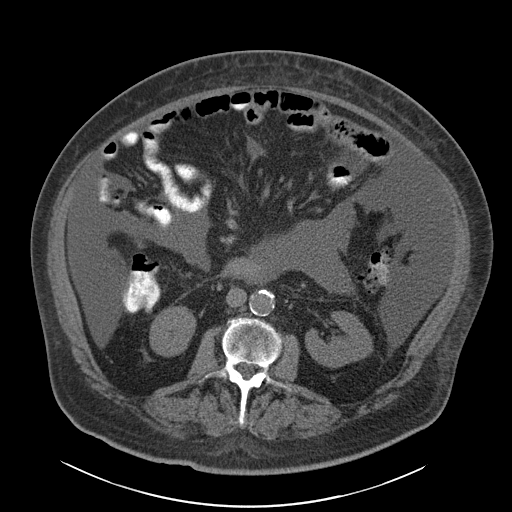
[im 62/111  soft-tissue]
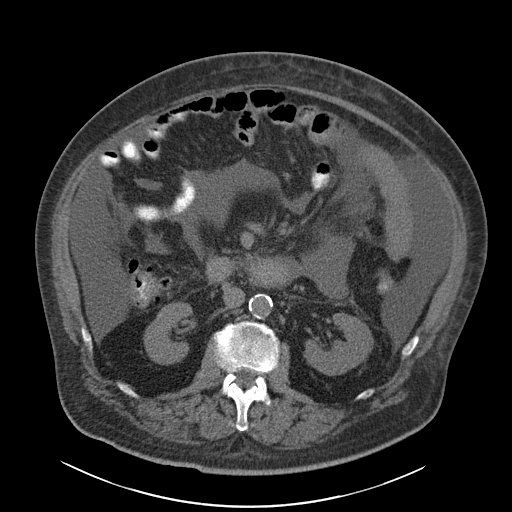
[im 71/111  soft-tissue]
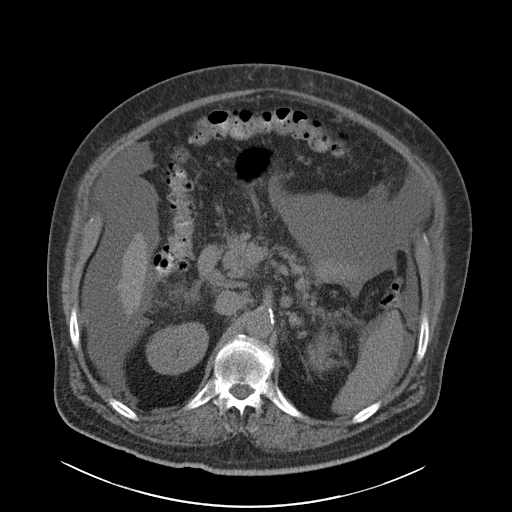
[im 71/111  bone]
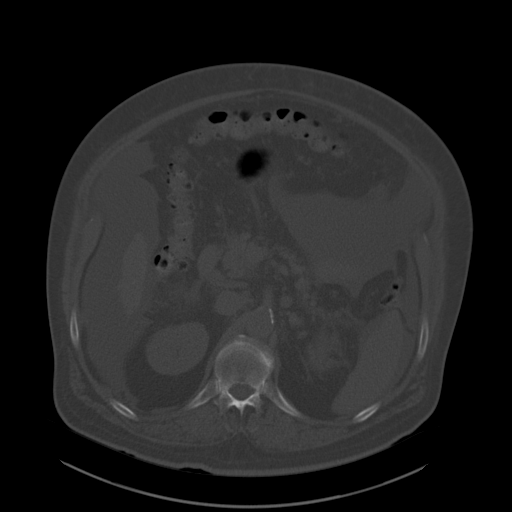
[im 80/111  soft-tissue]
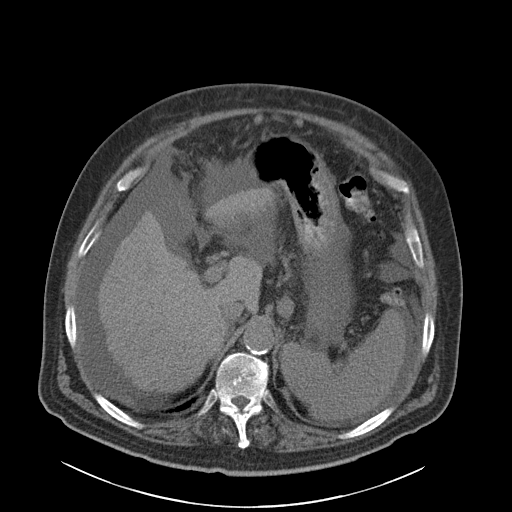
[im 89/111  soft-tissue]
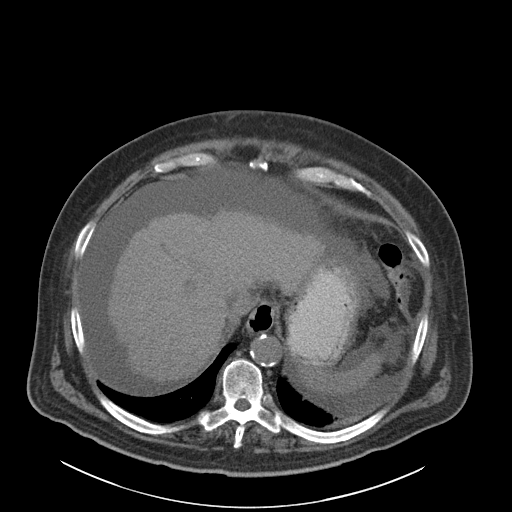
[im 93/111  lung]
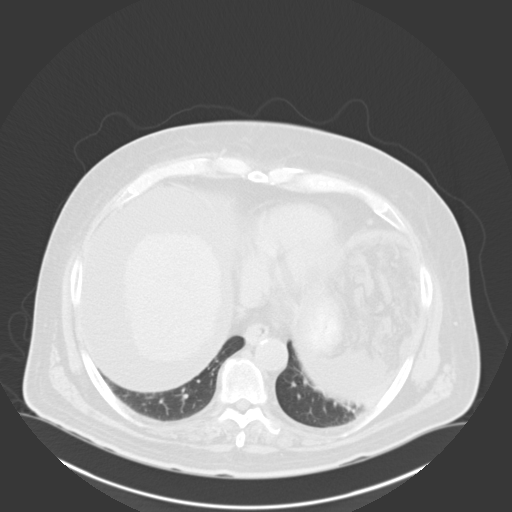
[im 97/111  soft-tissue]
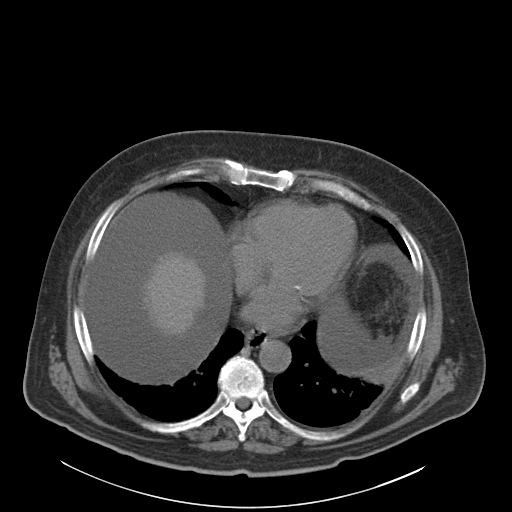
[im 97/111  lung]
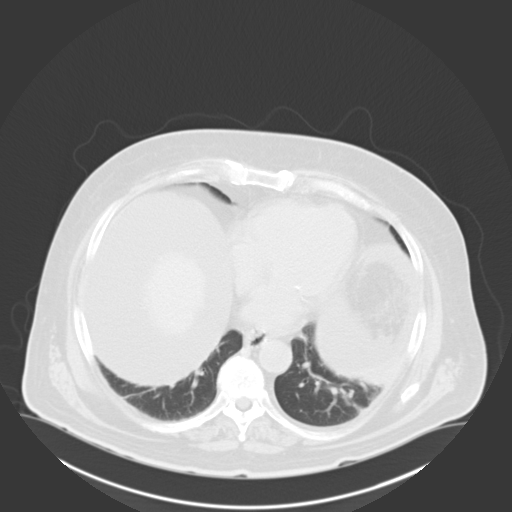
[im 102/111  lung]
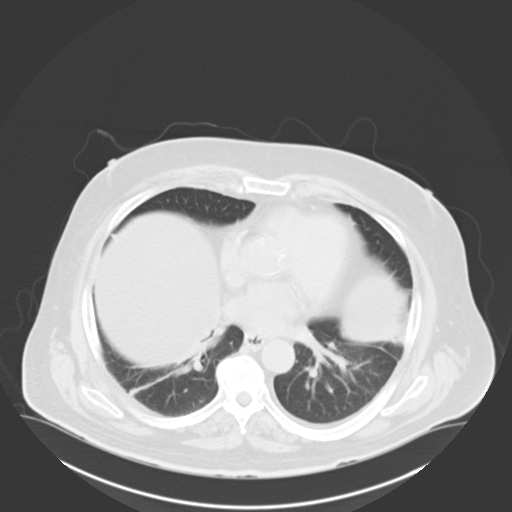
[im 106/111  soft-tissue]
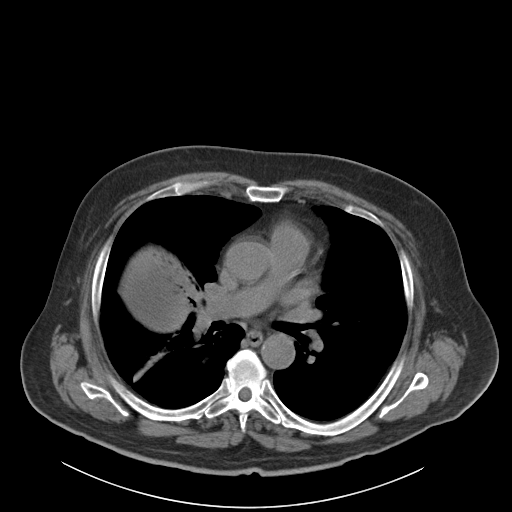
[im 106/111  lung]
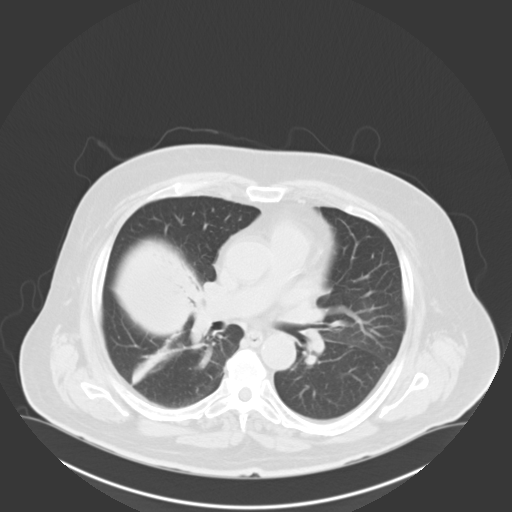

[15 of 32 positions shown; findings below may reference images not displayed]

FINDINGS: Linear atelectasis in size or scarring is seen within both lower
lobes.

The liver is small with a nodular contour or, compatible with
cirrhosis. Recannulized periumbilical vein is noted, consistent with
portal hypertension. No focal intrahepatic mass is identified.
Gallbladder is within normal limits. No biliary ductal dilatation.
The spleen is not enlarged measuring 13.1 cm in craniocaudal
dimension. Adrenal glands and pancreas are within normal limits.

Limited noncontrast evaluation of the kidneys is grossly
unremarkable without evidence of nephrolithiasis or hydronephrosis.

Small hiatal hernia is noted. There is no evidence of bowel
obstruction. Multiple scattered colonic diverticula are present
without CT evidence of acute diverticulitis. Appendix is not
definitely visualized. No inflammatory changes are seen about the
bowels.

Bladder is unremarkable. The prostate is enlarged measuring 6 mm
mm and transverse diameter.

Moderate volume ascites is present throughout the abdomen and
pelvis. No free air identified.

Multiple prominent lymph nodes are seen in the region of the porta
hepatis. The largest of these is seen in the gastrohepatic region
and measures 1.5 cm 1.6 cm in short axis (series 2, image 32).

Multilevel degenerative changes are noted within the visualized
spine. No acute osseous abnormality identified.

Diffuse anasarca is noted.
IMPRESSION: 1. Cirrhosis with evidence of portal hypertension. No splenomegaly
2. Moderate volume ascites with diffuse anasarca.
3. Enlarged 1.6 cm gastrohepatic lymph node. Additional shotty
adenopathy within the porta hepatis.
4. Colonic diverticulosis without evidence of acute diverticulitis.
5. Enlarged prostate.

## 2014-12-11 IMAGING — US US PARACENTESIS
1 series · 5 of 5 positions shown · non-contrast
Comparison: PREVIOUS PARACENTESIS ON 05/22/2013.

CLINICAL DATA: Cirrhosis, recurrent ascites. Request is made for
therapeutic paracentesis.

EXAM:
ULTRASOUND GUIDED THERAPEUTIC PARACENTESIS

[Series 1: us paracentesis · 0.27mm/px · 5 of 5 slices shown]
[im 1/5]
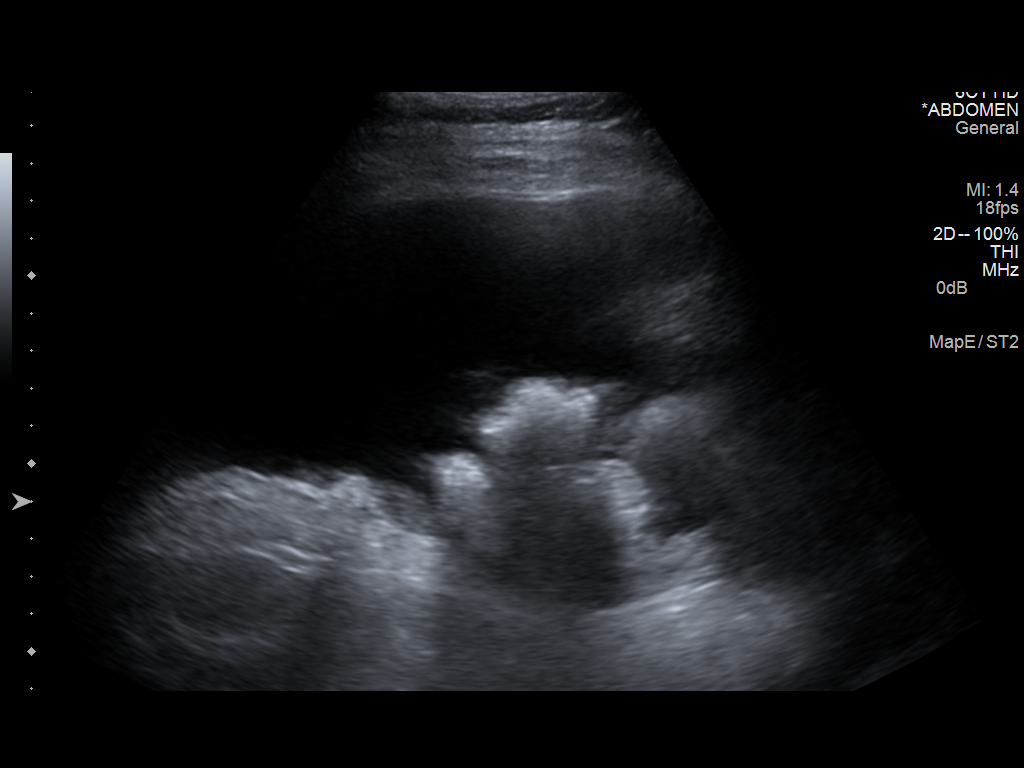
[im 2/5]
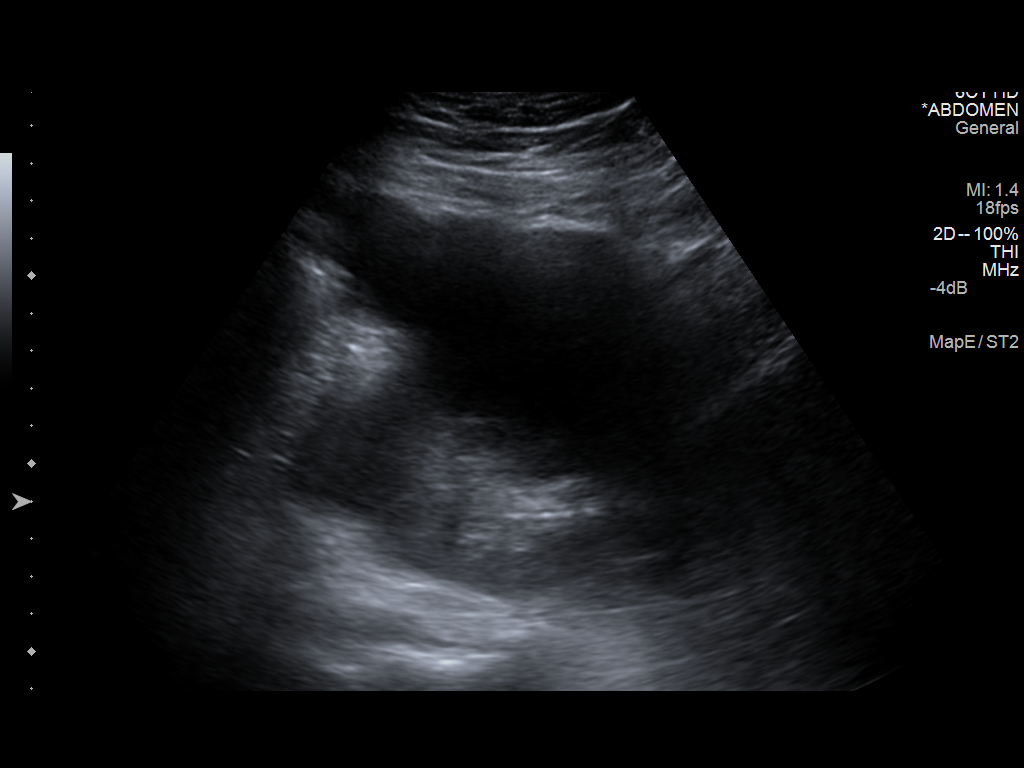
[im 3/5]
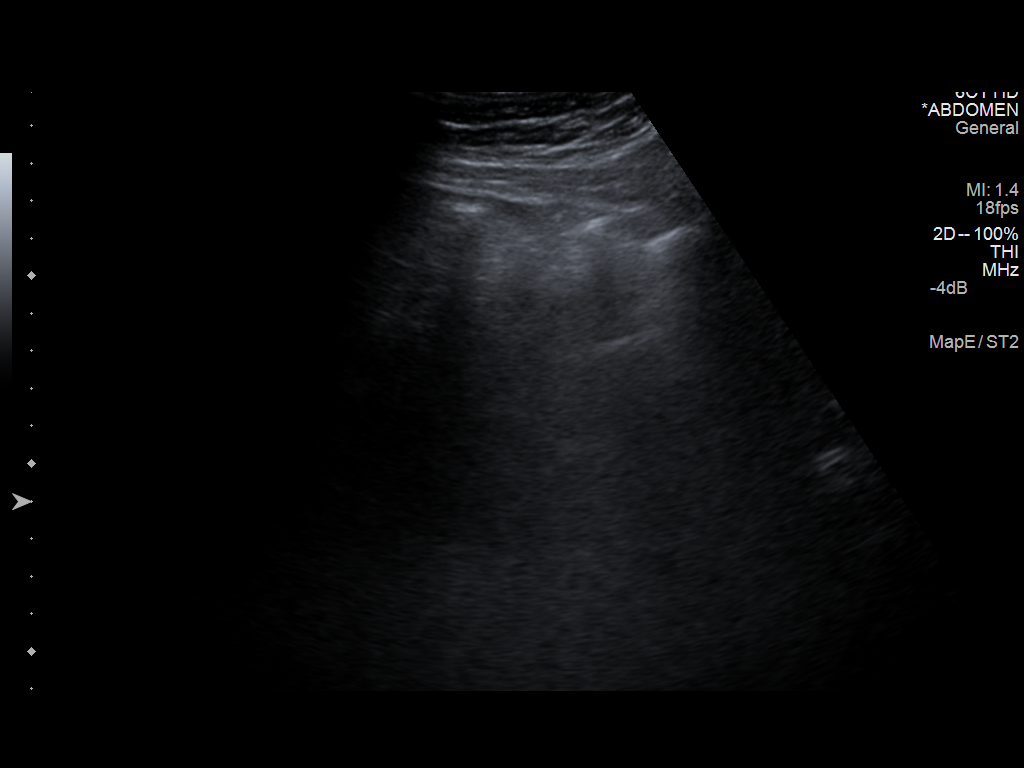
[im 4/5]
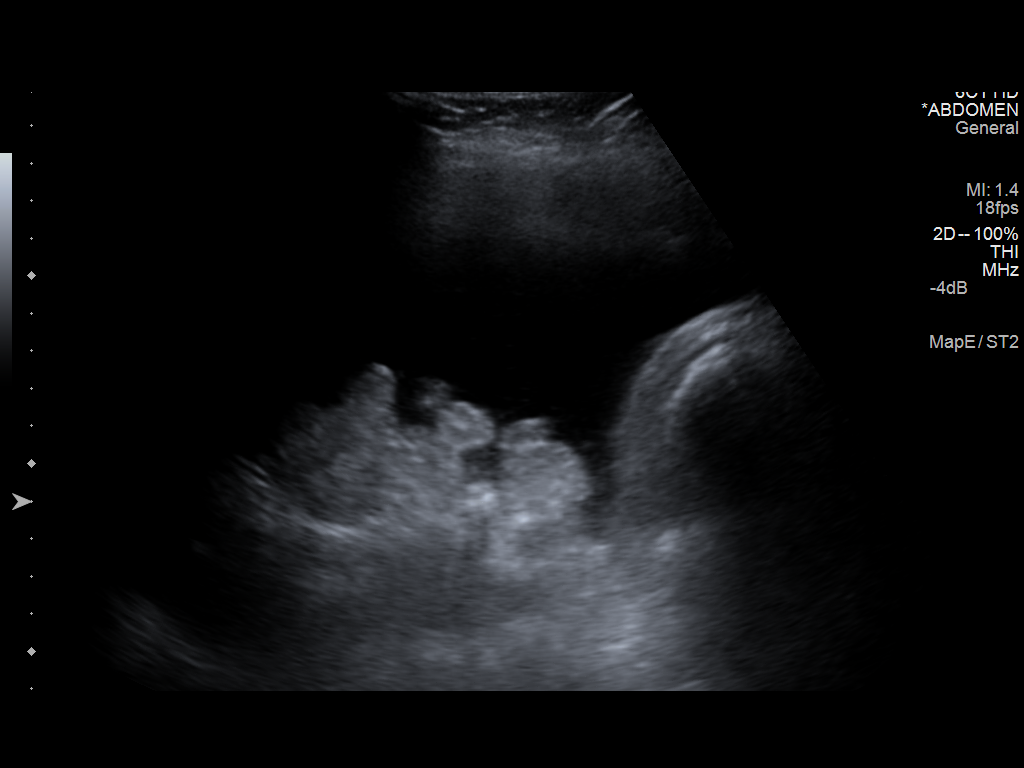
[im 5/5]
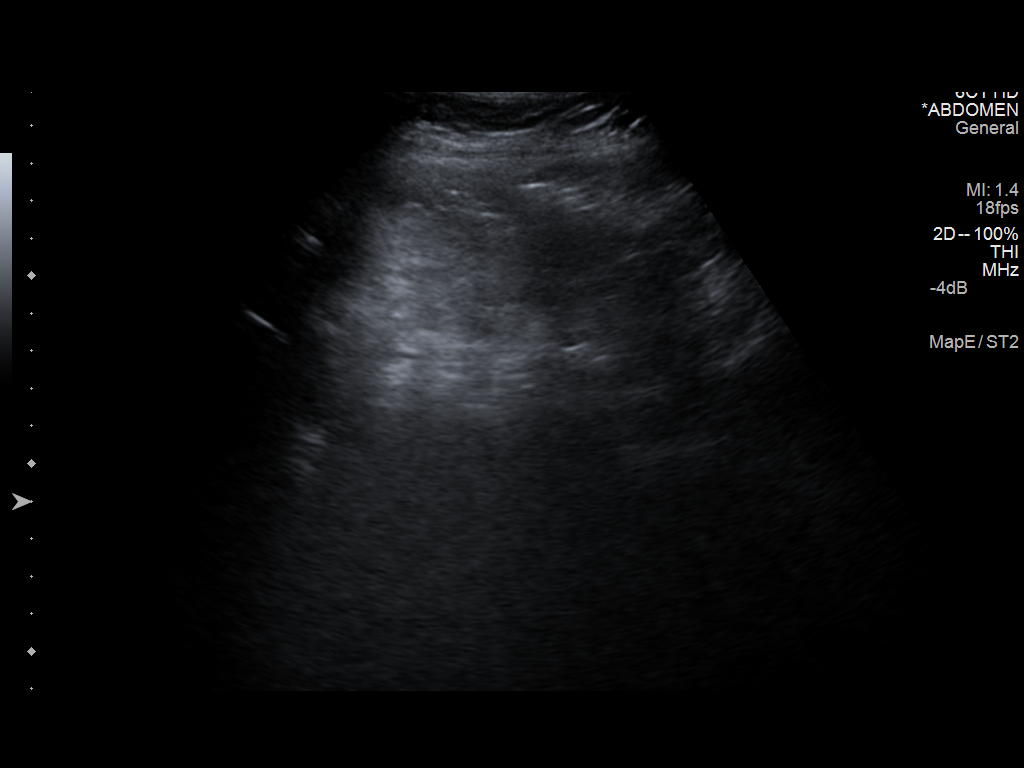

[5 of 5 positions shown; findings below may reference images not displayed]

PROCEDURE:
An ultrasound guided paracentesis was thoroughly discussed with the
patient and questions answered. The benefits, risks, alternatives
and complications were also discussed. The patient understands and
wishes to proceed with the procedure. Written consent was obtained.

Ultrasound was performed to localize and mark an adequate pocket of
fluid in the left mid to lower quadrant of the abdomen. The area was
then prepped and draped in the normal sterile fashion. 1% Lidocaine
was used for local anesthesia. Under ultrasound guidance a 19 gauge
Yueh catheter was introduced. Paracentesis was performed. The
catheter was removed and a dressing applied.

Complications: None.
FINDINGS: A total of approximately 2.9 liters of turbid, yellow fluid was
removed.
IMPRESSION: Successful ultrasound guided therapeutic paracentesis yielding
liters of ascites.

## 2014-12-13 IMAGING — CR DG CHEST 2V
2 series · 2 of 2 positions shown · non-contrast
Comparison: Chest radiograph performed 06/02/2013

CLINICAL DATA: Cough and nausea.

EXAM:
CHEST  2 VIEW

[w chest lat]
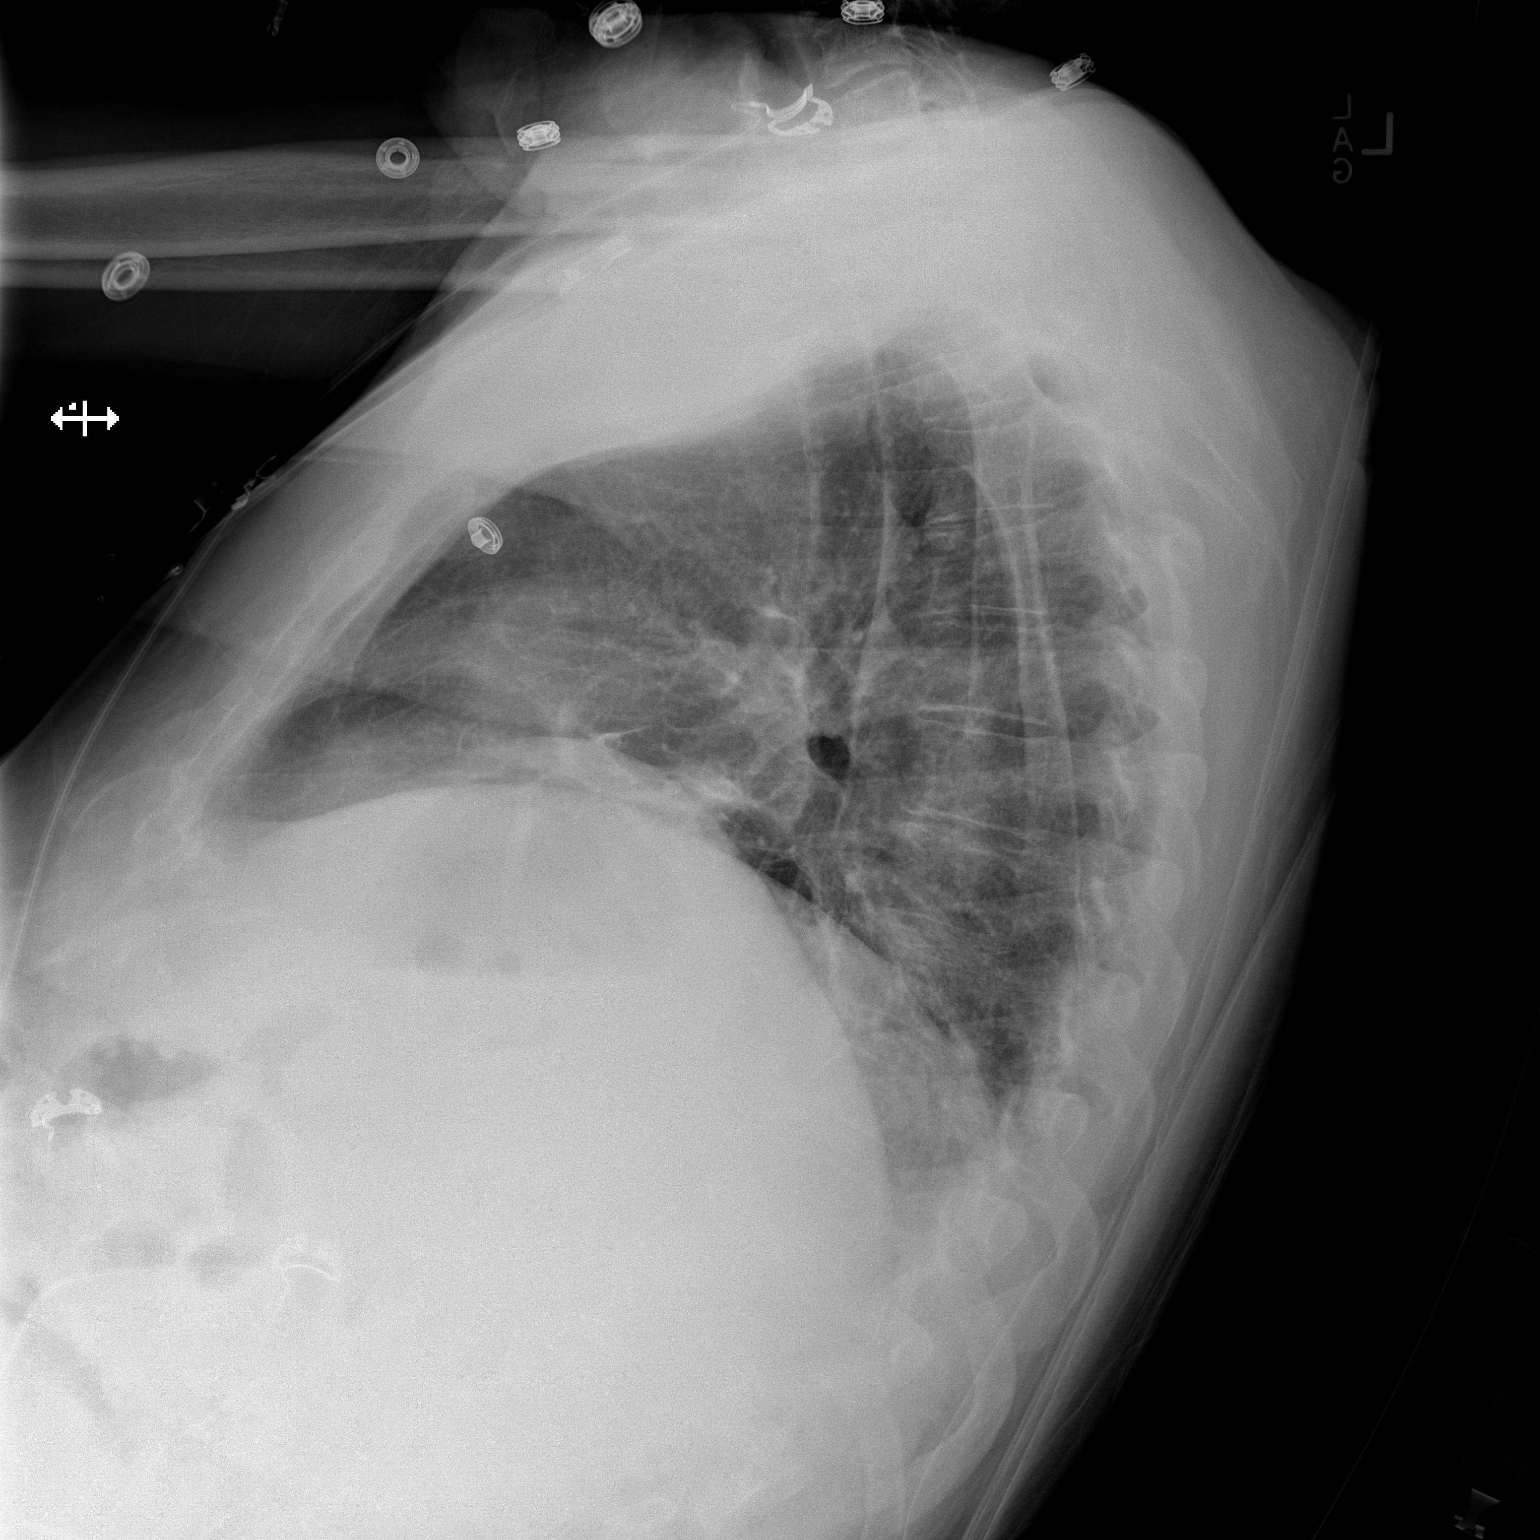

[x chest ap]
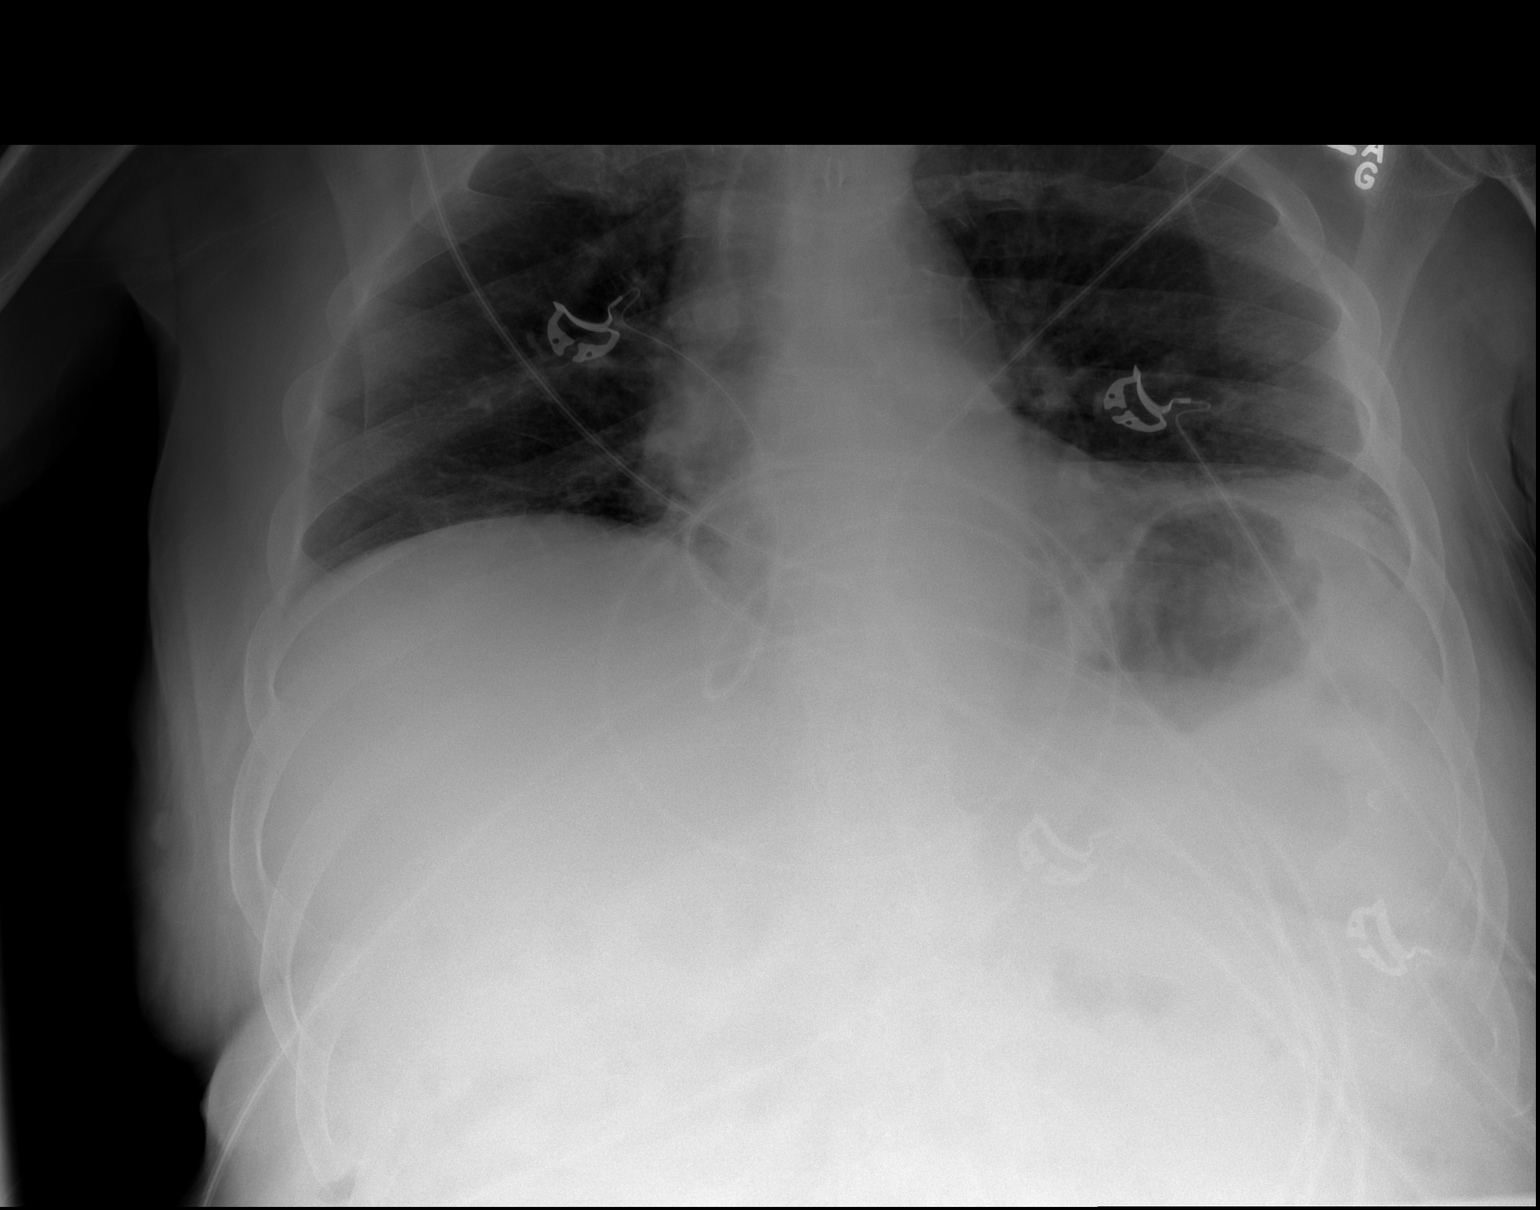

[2 of 2 positions shown; findings below may reference images not displayed]

FINDINGS: The lungs are hypoexpanded. Mild left basilar opacity may reflect
atelectasis or possibly mild pneumonia. There is no evidence of
pleural effusion or pneumothorax.

The heart is normal in size; the mediastinal contour is within
normal limits. No acute osseous abnormalities are seen.
IMPRESSION: Lungs hypoexpanded. Mild left basilar airspace opacity may reflect
atelectasis or possibly mild pneumonia.

## 2014-12-17 IMAGING — US US PARACENTESIS
1 series · 10 of 10 positions shown · non-contrast
Comparison: none

CLINICAL DATA: Cirrhosis, recurrent ascites, request for
therapeutic paracentesis.

EXAM:
ULTRASOUND GUIDED PARACENTESIS
TECHNIQUE: Survey ultrasound of the abdomen was performed and an appropriate
skin entry site in the RLQ abdomen was selected. Skin site was
marked, prepped with Betadine, and draped in usual sterile fashion,
and infiltrated locally with 1% lidocaine. A 5 Zina Roddy
Biviana Samaroo needle was advanced into the peritoneal space until
fluid could be aspirated. The sheath was advanced and the needle
removed. 6.2 liters of cloudy yellow ascites were aspirated. No
immediate complication.

[Series 1: us paracentesis · 0.27mm/px · 10 of 10 slices shown]
[im 1/10]
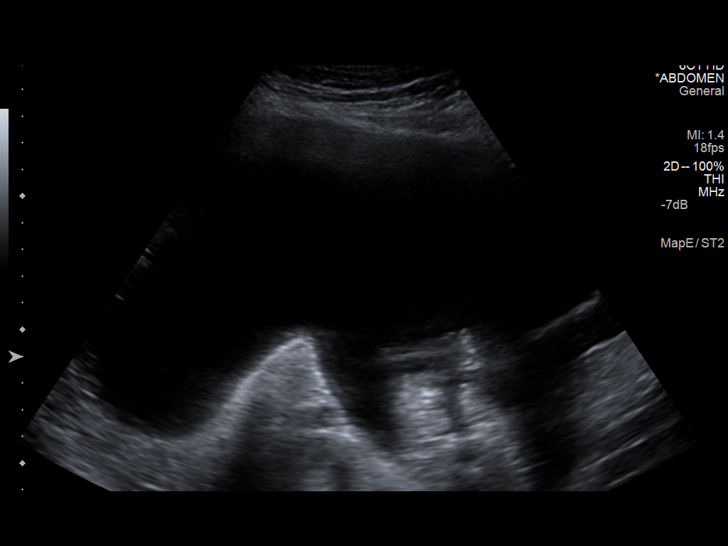
[im 2/10]
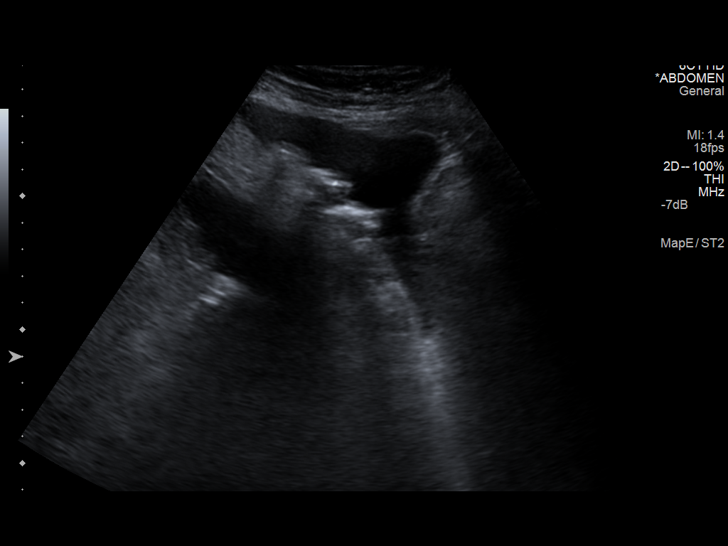
[im 3/10]
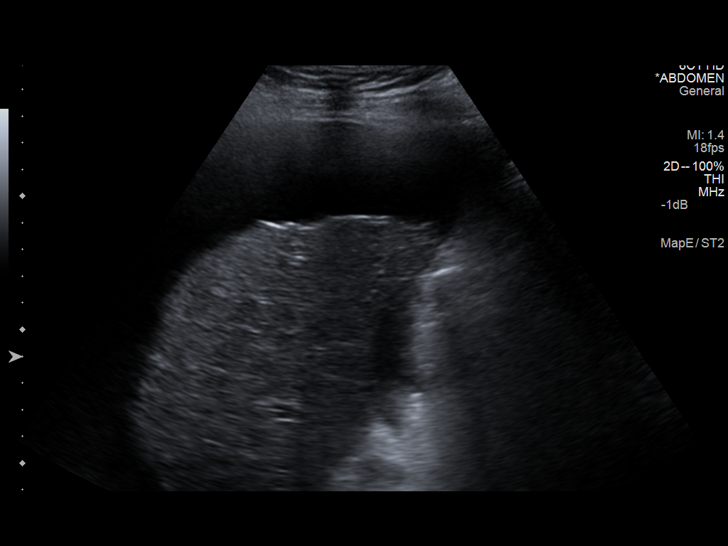
[im 4/10]
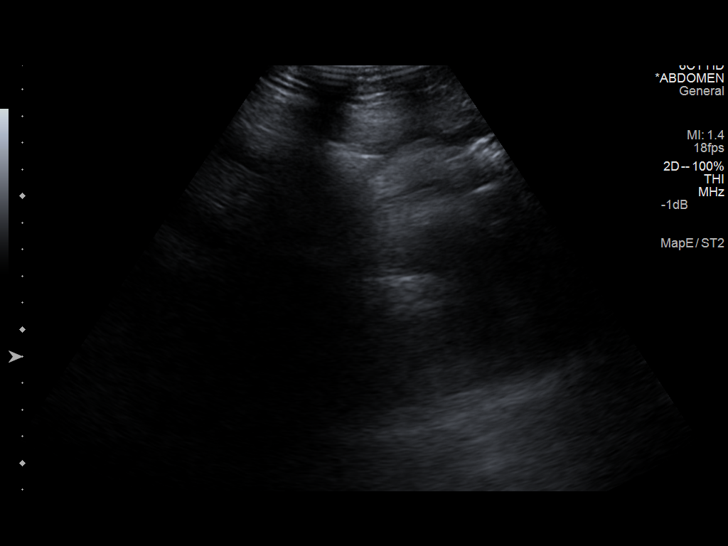
[im 5/10]
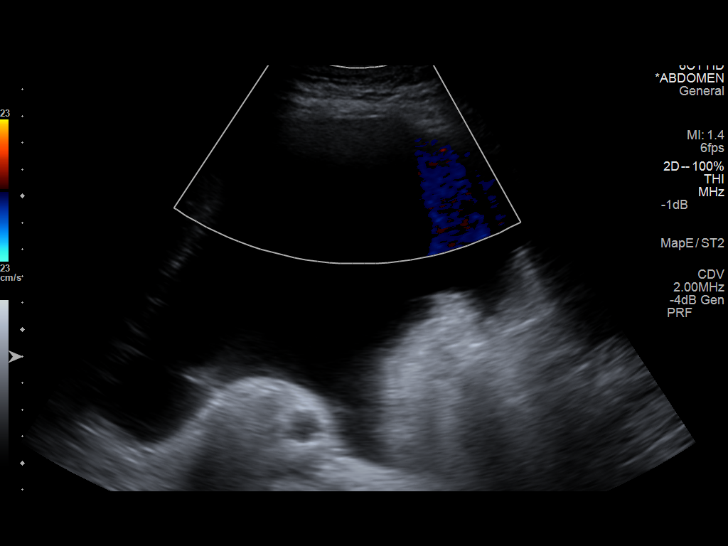
[im 6/10]
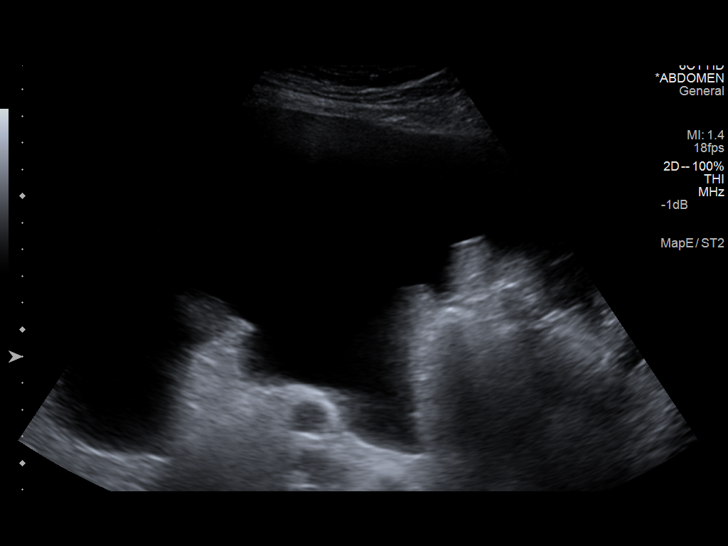
[im 7/10]
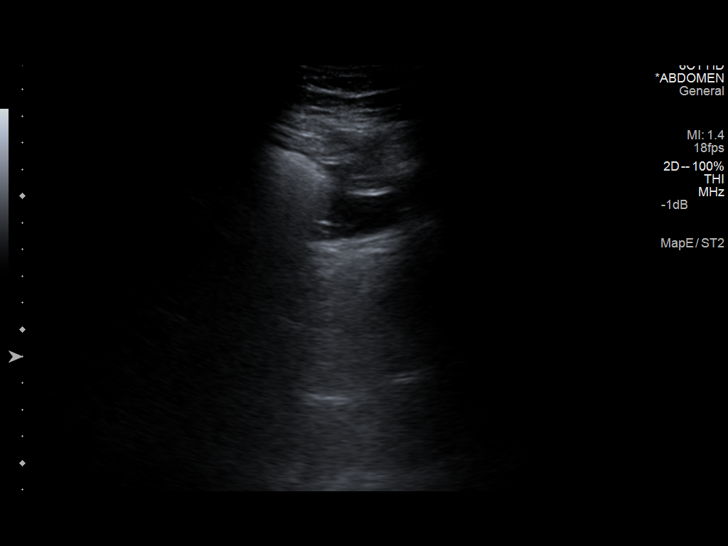
[im 8/10]
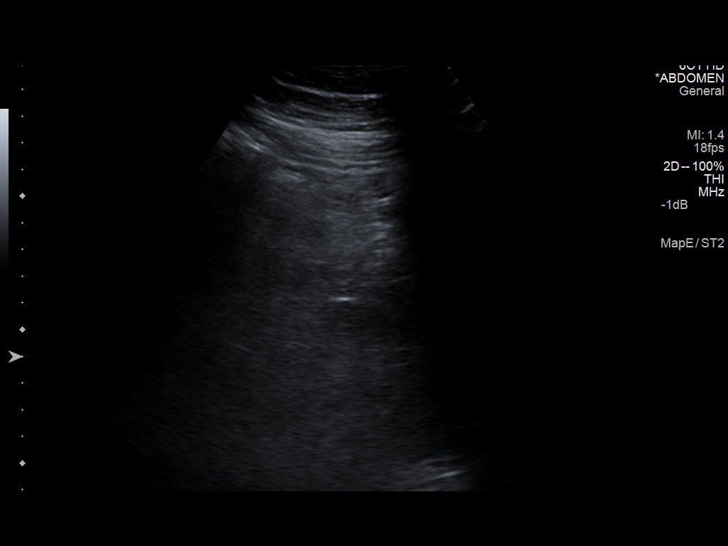
[im 9/10]
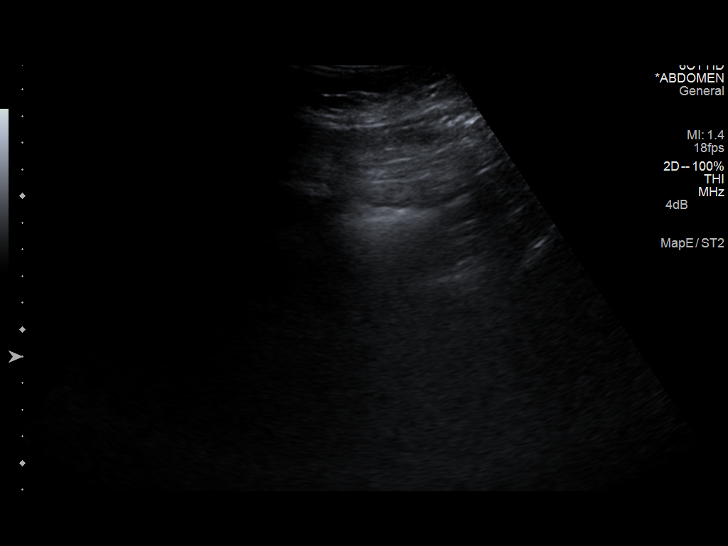
[im 10/10]
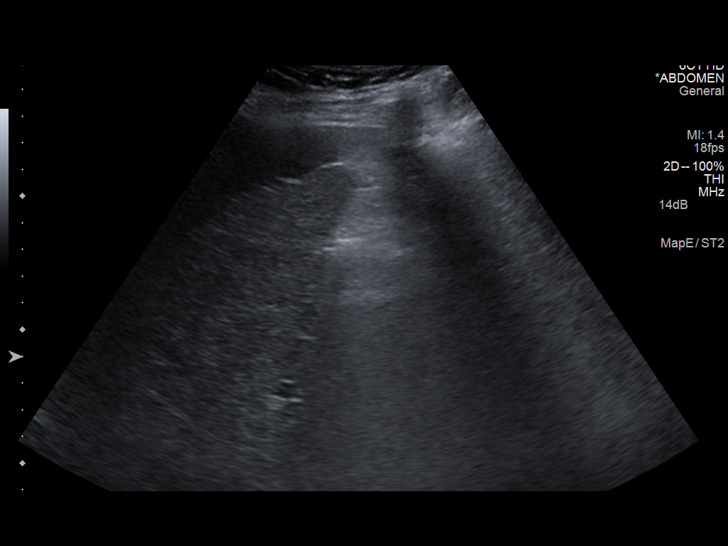

[10 of 10 positions shown; findings below may reference images not displayed]

IMPRESSION: Technically successful ultrasound guided paracentesis, removing
liters of ascites.
